# Patient Record
Sex: Female | Born: 1975 | Race: White | Hispanic: No | Marital: Married | Smoking: Former smoker
Health system: Southern US, Community
[De-identification: ages and names within clinical notes are randomized; demographics above are authoritative.]

## PROBLEM LIST (undated history)

## (undated) DIAGNOSIS — I82409 Acute embolism and thrombosis of unspecified deep veins of unspecified lower extremity: Secondary | ICD-10-CM

## (undated) DIAGNOSIS — R1011 Right upper quadrant pain: Secondary | ICD-10-CM

## (undated) DIAGNOSIS — E162 Hypoglycemia, unspecified: Secondary | ICD-10-CM

## (undated) DIAGNOSIS — I493 Ventricular premature depolarization: Secondary | ICD-10-CM

## (undated) DIAGNOSIS — R51 Headache: Secondary | ICD-10-CM

## (undated) DIAGNOSIS — Z9109 Other allergy status, other than to drugs and biological substances: Secondary | ICD-10-CM

## (undated) DIAGNOSIS — H3581 Retinal edema: Secondary | ICD-10-CM

## (undated) DIAGNOSIS — D72829 Elevated white blood cell count, unspecified: Secondary | ICD-10-CM

## (undated) DIAGNOSIS — Z8669 Personal history of other diseases of the nervous system and sense organs: Secondary | ICD-10-CM

## (undated) DIAGNOSIS — R519 Headache, unspecified: Secondary | ICD-10-CM

## (undated) DIAGNOSIS — K900 Celiac disease: Secondary | ICD-10-CM

## (undated) DIAGNOSIS — M545 Low back pain, unspecified: Secondary | ICD-10-CM

## (undated) DIAGNOSIS — R110 Nausea: Secondary | ICD-10-CM

## (undated) DIAGNOSIS — N809 Endometriosis, unspecified: Secondary | ICD-10-CM

## (undated) DIAGNOSIS — F419 Anxiety disorder, unspecified: Secondary | ICD-10-CM

## (undated) DIAGNOSIS — Z8489 Family history of other specified conditions: Secondary | ICD-10-CM

## (undated) DIAGNOSIS — I2699 Other pulmonary embolism without acute cor pulmonale: Secondary | ICD-10-CM

## (undated) DIAGNOSIS — T7840XA Allergy, unspecified, initial encounter: Secondary | ICD-10-CM

## (undated) DIAGNOSIS — J45909 Unspecified asthma, uncomplicated: Secondary | ICD-10-CM

## (undated) DIAGNOSIS — K219 Gastro-esophageal reflux disease without esophagitis: Secondary | ICD-10-CM

## (undated) DIAGNOSIS — R1013 Epigastric pain: Secondary | ICD-10-CM

## (undated) DIAGNOSIS — H9319 Tinnitus, unspecified ear: Secondary | ICD-10-CM

## (undated) HISTORY — DX: Elevated white blood cell count, unspecified: D72.829

## (undated) HISTORY — DX: Celiac disease: K90.0

## (undated) HISTORY — DX: Other allergy status, other than to drugs and biological substances: Z91.09

## (undated) HISTORY — DX: Acute embolism and thrombosis of unspecified deep veins of unspecified lower extremity: I82.409

## (undated) HISTORY — DX: Personal history of other diseases of the nervous system and sense organs: Z86.69

## (undated) HISTORY — DX: Hypoglycemia, unspecified: E16.2

## (undated) HISTORY — DX: Tinnitus, unspecified ear: H93.19

## (undated) HISTORY — DX: Right upper quadrant pain: R10.11

## (undated) HISTORY — DX: Unspecified asthma, uncomplicated: J45.909

## (undated) HISTORY — DX: Retinal edema: H35.81

## (undated) HISTORY — PX: OTHER SURGICAL HISTORY: SHX169

## (undated) HISTORY — DX: Gastro-esophageal reflux disease without esophagitis: K21.9

## (undated) HISTORY — DX: Allergy, unspecified, initial encounter: T78.40XA

## (undated) HISTORY — PX: DILATION AND CURETTAGE OF UTERUS: SHX78

## (undated) HISTORY — PX: ABDOMINAL HYSTERECTOMY: SHX81

## (undated) HISTORY — DX: Nausea: R11.0

## (undated) HISTORY — DX: Low back pain, unspecified: M54.50

## (undated) HISTORY — PX: WISDOM TOOTH EXTRACTION: SHX21

## (undated) HISTORY — DX: Ventricular premature depolarization: I49.3

## (undated) HISTORY — DX: Anxiety disorder, unspecified: F41.9

## (undated) HISTORY — DX: Epigastric pain: R10.13

## (undated) HISTORY — DX: Low back pain: M54.5

## (undated) HISTORY — DX: Endometriosis, unspecified: N80.9

---

## 2013-11-05 ENCOUNTER — Other Ambulatory Visit (HOSPITAL_COMMUNITY)
Admission: RE | Admit: 2013-11-05 | Discharge: 2013-11-05 | Disposition: A | Discharge status: Home / Self Care | Payer: Managed Care, Other (non HMO) | Source: Ambulatory Visit | Attending: Family Medicine | Admitting: Family Medicine

## 2013-11-05 DIAGNOSIS — Z01419 Encounter for gynecological examination (general) (routine) without abnormal findings: Secondary | ICD-10-CM | POA: Insufficient documentation

## 2014-01-18 ENCOUNTER — Ambulatory Visit: Payer: Managed Care, Other (non HMO) | Attending: Obstetrics & Gynecology | Admitting: Physical Therapy

## 2014-01-18 DIAGNOSIS — M2420 Disorder of ligament, unspecified site: Secondary | ICD-10-CM | POA: Insufficient documentation

## 2014-01-18 DIAGNOSIS — N8000 Endometriosis of the uterus, unspecified: Secondary | ICD-10-CM | POA: Insufficient documentation

## 2014-01-18 DIAGNOSIS — M629 Disorder of muscle, unspecified: Secondary | ICD-10-CM | POA: Insufficient documentation

## 2014-01-18 DIAGNOSIS — IMO0001 Reserved for inherently not codable concepts without codable children: Secondary | ICD-10-CM | POA: Insufficient documentation

## 2014-01-29 ENCOUNTER — Ambulatory Visit: Payer: Managed Care, Other (non HMO) | Attending: Obstetrics & Gynecology | Admitting: Physical Therapy

## 2014-01-29 DIAGNOSIS — N8000 Endometriosis of the uterus, unspecified: Secondary | ICD-10-CM | POA: Insufficient documentation

## 2014-01-29 DIAGNOSIS — IMO0001 Reserved for inherently not codable concepts without codable children: Secondary | ICD-10-CM | POA: Insufficient documentation

## 2014-01-29 DIAGNOSIS — M2420 Disorder of ligament, unspecified site: Secondary | ICD-10-CM | POA: Insufficient documentation

## 2014-01-29 DIAGNOSIS — M629 Disorder of muscle, unspecified: Secondary | ICD-10-CM | POA: Insufficient documentation

## 2014-02-05 ENCOUNTER — Ambulatory Visit: Payer: Managed Care, Other (non HMO) | Admitting: Physical Therapy

## 2014-02-12 ENCOUNTER — Ambulatory Visit: Payer: Managed Care, Other (non HMO) | Admitting: Physical Therapy

## 2014-02-19 ENCOUNTER — Ambulatory Visit: Payer: Managed Care, Other (non HMO) | Admitting: Physical Therapy

## 2014-02-23 ENCOUNTER — Encounter: Payer: Self-pay | Admitting: Internal Medicine

## 2014-02-23 ENCOUNTER — Ambulatory Visit (INDEPENDENT_AMBULATORY_CARE_PROVIDER_SITE_OTHER): Payer: Managed Care, Other (non HMO) | Admitting: Internal Medicine

## 2014-02-23 ENCOUNTER — Encounter (INDEPENDENT_AMBULATORY_CARE_PROVIDER_SITE_OTHER): Payer: Self-pay

## 2014-02-23 VITALS — BP 102/64 | HR 61 | Ht 64.0 in | Wt 143.4 lb

## 2014-02-23 DIAGNOSIS — J309 Allergic rhinitis, unspecified: Secondary | ICD-10-CM

## 2014-02-23 DIAGNOSIS — J3089 Other allergic rhinitis: Secondary | ICD-10-CM

## 2014-02-23 DIAGNOSIS — J4520 Mild intermittent asthma, uncomplicated: Secondary | ICD-10-CM

## 2014-02-23 DIAGNOSIS — J302 Other seasonal allergic rhinitis: Secondary | ICD-10-CM

## 2014-02-23 DIAGNOSIS — J45909 Unspecified asthma, uncomplicated: Secondary | ICD-10-CM

## 2014-02-23 MED ORDER — FLUNISOLIDE HFA 80 MCG/ACT IN AERS: INHALATION_SPRAY | RESPIRATORY_TRACT | Status: DC

## 2014-02-23 NOTE — Progress Notes (Signed)
02/23/14- 38 yoF former smoker referred courtesy of [facility] [place], [place]:.Asthma-needs long acting meds  Moved from [place], new to [employer].. Began as exercise associated asthma dx'd around 1993. Was on Advair 100= sufficient, but says current insurance won't cover any "long acting inhalers". Says Dulera made her "anxious". Never tried singulair. Grew up in smoking family. Skin tested positive but never on shots. Had tubes in ears, otherwise no ENT surgery.  Celiac Positive- avoids gluten.Denies GERD, no recent sinusitis.  Never hosp for asthma. Triggers: exercise, cold aitr, virus infections, pollen allergies-cats, horses, dust.   Prior to Admission medications   Medication Sig Start Date End Date Taking? Authorizing Provider  albuterol (PROAIR HFA) 108 (90 BASE) MCG/ACT inhaler Inhale 2 puffs into the lungs every 6 (six) hours as needed for wheezing or shortness of breath.   Yes Historical Provider, MD  levonorgestrel (MIRENA) 20 MCG/24HR IUD 1 each by Intrauterine route once. Placed 11-03-2013   Yes Historical Provider, MD  beclomethasone (QVAR) 40 MCG/ACT inhaler Inhale 2 puffs into the lungs 2 (two) times daily. 03/15/14   Deneise Lever, MD   Past Medical History  Diagnosis Date  . Endometriosis   . Asthma   . Environmental allergies   . Celiac disease   . Hypoglycemia   . Tinnitus   . Retinal edema   . Hx of migraine headaches   . Low back pain    Past Surgical History  Procedure Laterality Date  . Tubes in ears      age 38  . Wisdom tooth extraction    . Dilation and curettage of uterus     Family History  Problem Relation Age of Onset  . Emphysema Maternal Grandmother     smoker  . Emphysema Maternal Grandfather   . Allergies      both sides of family  . Asthma Mother   . Asthma Brother   . Breast cancer      father side  . Colon cancer Paternal Grandmother   . Heart disease Paternal Grandfather   . Heart attack Paternal Grandfather   . Migraines Sister   .  Endometriosis Sister   . Allergies Sister   . Allergies Brother    History   Social History  . Marital Status: Married    Spouse Name: N/A    Number of Children: 1  . Years of Education: N/A   Occupational History  . clergy    Social History Main Topics  . Smoking status: Former Smoker -- 1.00 packs/day for 5 years    Types: Cigarettes    Quit date: 11/26/2001  . Smokeless tobacco: Not on file  . Alcohol Use: Yes     Comment: 1-3 weekly  . Drug Use: No  . Sexual Activity: Not on file   Other Topics Concern  . Not on file   Social History Narrative  . No narrative on file   ROS-see HPI Constitutional:   No-   weight loss, night sweats, fevers, chills, fatigue, lassitude. HEENT:   No-  headaches, difficulty swallowing, tooth/dental problems, sore throat,       No-  sneezing, itching, ear ache, +nasal congestion, post nasal drip,  CV:  No-   chest pain, orthopnea, PND, swelling in lower extremities, anasarca,  dizziness, +palpitations Resp: + shortness of breath with exertion or at rest.              + productive cough,  + non-productive cough,  No- coughing up of blood.              No-   change in color of mucus.  No- wheezing.   Skin: No-   rash or lesions. GI:  No-   heartburn, indigestion, abdominal pain, nausea, vomiting, diarrhea,                 change in bowel habits, loss of appetite GU: No-   dysuria, change in color of urine, no urgency or frequency.  No- flank pain. MS:  No-   joint pain or swelling.  No- decreased range of motion.  No- back pain. Neuro-     nothing unusual Psych:  No- change in mood or affect. No depression or anxiety.  No memory loss.  OBJ- Physical Exam General- Alert, Oriented, Affect-appropriate, Distress- none acute, well appearing Skin- rash-none, lesions- none, excoriation- none Lymphadenopathy- none Head- atraumatic            Eyes- Gross vision intact, PERRLA, conjunctivae and  secretions clear            Ears- Hearing, canals-normal            Nose-  +watery, no-Septal dev, mucus, polyps, erosion, perforation             Throat- Mallampati II , mucosa clear , drainage- none, tonsils- atrophic Neck- flexible , trachea midline, no stridor , thyroid nl, carotid no bruit Chest - symmetrical excursion , unlabored           Heart/CV- RRR , no murmur , no gallop  , no rub, nl s1 s2                           - JVD- none , edema- none, stasis changes- none, varices- none           Lung- clear to P&A, wheeze- none, cough- none , dullness-none, rub- none           Chest wall-  Abd- tender-no, distended-no, bowel sounds-present, HSM- no Br/ Gen/ Rectal- Not done, not indicated Extrem- cyanosis- none, clubbing, none, atrophy- none, strength- nl Neuro- grossly intact to observation

## 2014-02-23 NOTE — Patient Instructions (Addendum)
Ok to use the [employer] albuterol HFA rescue inhaler 2 puffs, up to 4 times daily , if needed  Try sample/ script Aerospan steroid inhaler for maintenance    2 puffs then rinse mouth, twice daily  Ok to also take an otc antihistamine like Zyrtec/ cetirizine if needed

## 2014-03-05 ENCOUNTER — Ambulatory Visit: Payer: Managed Care, Other (non HMO) | Attending: Obstetrics & Gynecology | Admitting: Physical Therapy

## 2014-03-05 DIAGNOSIS — IMO0001 Reserved for inherently not codable concepts without codable children: Secondary | ICD-10-CM | POA: Insufficient documentation

## 2014-03-05 DIAGNOSIS — M2420 Disorder of ligament, unspecified site: Secondary | ICD-10-CM | POA: Insufficient documentation

## 2014-03-05 DIAGNOSIS — M629 Disorder of muscle, unspecified: Secondary | ICD-10-CM | POA: Insufficient documentation

## 2014-03-05 DIAGNOSIS — N8000 Endometriosis of the uterus, unspecified: Secondary | ICD-10-CM | POA: Insufficient documentation

## 2014-03-15 ENCOUNTER — Telehealth: Payer: Self-pay | Admitting: Internal Medicine

## 2014-03-15 MED ORDER — BECLOMETHASONE DIPROPIONATE 40 MCG/ACT IN AERS: 2.0000 | INHALATION_SPRAY | Freq: Two times a day (BID) | RESPIRATORY_TRACT | Status: DC

## 2014-03-15 NOTE — Telephone Encounter (Signed)
Spoke w/ pt. She reports Jeralyn Ruths is not covered.  I called spoke w/ [facility]. Was advised the covered alternatives they were provided with includes ASMANEX, FLOVENT, PULMICORT, QVAR Please advise Dr. Annamaria Boots thanks  No Known Allergies

## 2014-03-15 NOTE — Telephone Encounter (Signed)
Ok to d/c [employer] and Rx Qvar 40, 2 puffs then rinse mouth, twice daily  Refill prn

## 2014-03-15 NOTE — Telephone Encounter (Signed)
I called and made pt aware changing aerospan to QVAR. She voiced her understanding and RX called in. Nothing further needed

## 2014-03-24 DIAGNOSIS — J3089 Other allergic rhinitis: Secondary | ICD-10-CM

## 2014-03-24 DIAGNOSIS — J4520 Mild intermittent asthma, uncomplicated: Secondary | ICD-10-CM | POA: Insufficient documentation

## 2014-03-24 DIAGNOSIS — J302 Other seasonal allergic rhinitis: Secondary | ICD-10-CM | POA: Insufficient documentation

## 2014-03-24 NOTE — Assessment & Plan Note (Signed)
Much need for anything more than occasional antihistamine. Discussed environmental precautions.

## 2014-03-24 NOTE — Assessment & Plan Note (Signed)
Plan-try steroid inhaler Aerospan plus Singulair, with instruction

## 2014-03-26 ENCOUNTER — Ambulatory Visit: Payer: Managed Care, Other (non HMO) | Attending: Obstetrics & Gynecology | Admitting: Physical Therapy

## 2014-03-26 DIAGNOSIS — IMO0001 Reserved for inherently not codable concepts without codable children: Secondary | ICD-10-CM | POA: Insufficient documentation

## 2014-03-26 DIAGNOSIS — N8000 Endometriosis of the uterus, unspecified: Secondary | ICD-10-CM | POA: Insufficient documentation

## 2014-03-26 DIAGNOSIS — M2420 Disorder of ligament, unspecified site: Secondary | ICD-10-CM | POA: Insufficient documentation

## 2014-03-26 DIAGNOSIS — M629 Disorder of muscle, unspecified: Secondary | ICD-10-CM | POA: Insufficient documentation

## 2014-05-10 ENCOUNTER — Ambulatory Visit (INDEPENDENT_AMBULATORY_CARE_PROVIDER_SITE_OTHER): Payer: Managed Care, Other (non HMO) | Admitting: Internal Medicine

## 2014-05-10 ENCOUNTER — Encounter (INDEPENDENT_AMBULATORY_CARE_PROVIDER_SITE_OTHER): Payer: Self-pay

## 2014-05-10 ENCOUNTER — Encounter: Payer: Self-pay | Admitting: Internal Medicine

## 2014-05-10 VITALS — BP 108/58 | HR 56 | Ht 64.0 in | Wt 142.0 lb

## 2014-05-10 DIAGNOSIS — J302 Other seasonal allergic rhinitis: Secondary | ICD-10-CM

## 2014-05-10 DIAGNOSIS — J45909 Unspecified asthma, uncomplicated: Secondary | ICD-10-CM

## 2014-05-10 DIAGNOSIS — J309 Allergic rhinitis, unspecified: Secondary | ICD-10-CM

## 2014-05-10 DIAGNOSIS — J3089 Other allergic rhinitis: Secondary | ICD-10-CM

## 2014-05-10 DIAGNOSIS — J4520 Mild intermittent asthma, uncomplicated: Secondary | ICD-10-CM

## 2014-05-10 MED ORDER — BECLOMETHASONE DIPROPIONATE 40 MCG/ACT IN AERS: INHALATION_SPRAY | RESPIRATORY_TRACT | Status: DC

## 2014-05-10 MED ORDER — ALBUTEROL SULFATE HFA 108 (90 BASE) MCG/ACT IN AERS: INHALATION_SPRAY | RESPIRATORY_TRACT | Status: DC

## 2014-05-10 MED ORDER — AEROCHAMBER MV MISC: Status: AC

## 2014-05-10 NOTE — Patient Instructions (Addendum)
Our nurse will call your drug store to have them put on hold the refill scripts for Qvar 40 and Proair    [facility] made for aerochamber spacer tube. Try using this with your Qvar steroid inhaler to reduce hoarseness.

## 2014-05-10 NOTE — Progress Notes (Signed)
02/23/14- 38 yoF former smoker referred courtesy of [facility] [place], [place]:.Asthma-needs long acting meds  Moved from [place], new to [employer].. Began as exercise associated asthma dx'd around 1993. Was on Advair 100= sufficient, but says current insurance won't cover any "long acting inhalers". Says Dulera made her "anxious". Never tried singulair. Grew up in smoking family. Skin tested positive but never on shots. Had tubes in ears, otherwise no ENT surgery.  Celiac Positive- avoids gluten.Denies GERD, no recent sinusitis.  Never hosp for asthma. Triggers: exercise, cold aitr, virus infections, pollen allergies-cats, horses, dust.   05/10/14- 74 yoF former smoker referred courtesy of [facility] [place], [place]:.Asthma-needs long acting meds  FOLLOWS FOR: needs proair refill.  No complaints with meds/symptoms.  Qvar seems to help.  Doing well without chest tightness or significant cough. Continues Qvar 40. Uses rescue inhaler about once a day before exercise. Denies nasal congestion.  ROS-see HPI Constitutional:   No-   weight loss, night sweats, fevers, chills, fatigue, lassitude. HEENT:   No-  headaches, difficulty swallowing, tooth/dental problems, sore throat,       No-  sneezing, itching, ear ache, nasal congestion, post nasal drip,  CV:  No-   chest pain, orthopnea, PND, swelling in lower extremities, anasarca,                                                       dizziness, +palpitations Resp: + shortness of breath with exertion or at rest.              + productive cough,  + non-productive cough,  No- coughing up of blood.              No-   change in color of mucus.  No- wheezing.   Skin: No-   rash or lesions. GI:  No-   heartburn, indigestion, abdominal pain, nausea, vomiting,  GU:  MS:  No-   joint pain or swelling.   Neuro-     nothing unusual Psych:  No- change in mood or affect. No depression or anxiety.  No memory loss.  OBJ- Physical Exam General- Alert, Oriented,  Affect-appropriate, Distress- none acute, well appearing Skin- rash-none, lesions- none, excoriation- none Lymphadenopathy- none Head- atraumatic            Eyes- Gross vision intact, PERRLA, conjunctivae and secretions clear            Ears- Hearing, canals-normal            Nose-  clear, no-Septal dev, mucus, polyps, erosion, perforation             Throat- Mallampati II , mucosa clear , drainage- none, tonsils- atrophic Neck- flexible , trachea midline, no stridor , thyroid nl, carotid no bruit Chest - symmetrical excursion , unlabored           Heart/CV- RRR , no murmur , no gallop  , no rub, nl s1 s2                           - JVD- none , edema- none, stasis changes- none, varices- none           Lung- clear to P&A, wheeze- none, cough- none , dullness-none, rub- none  Chest wall-  Abd-  Br/ Gen/ Rectal- Not done, not indicated Extrem- cyanosis- none, clubbing, none, atrophy- none, strength- nl Neuro- grossly intact to observation

## 2014-05-20 ENCOUNTER — Other Ambulatory Visit: Payer: Self-pay | Admitting: *Deleted

## 2014-05-20 MED ORDER — BECLOMETHASONE DIPROPIONATE 40 MCG/ACT IN AERS: INHALATION_SPRAY | RESPIRATORY_TRACT | Status: DC

## 2014-07-11 NOTE — Assessment & Plan Note (Addendum)
Control maintained with Qvar 40 and appropriate use of rescue inhaler before exercise Plan-refill inhalers, add spacer tube

## 2014-07-11 NOTE — Assessment & Plan Note (Signed)
Just using saline nasal spray now

## 2014-11-09 ENCOUNTER — Encounter: Payer: Self-pay | Admitting: Internal Medicine

## 2014-11-09 ENCOUNTER — Ambulatory Visit (INDEPENDENT_AMBULATORY_CARE_PROVIDER_SITE_OTHER): Payer: Managed Care, Other (non HMO) | Admitting: Internal Medicine

## 2014-11-09 VITALS — BP 98/62 | HR 56 | Ht 64.0 in | Wt 142.2 lb

## 2014-11-09 DIAGNOSIS — J3089 Other allergic rhinitis: Secondary | ICD-10-CM

## 2014-11-09 DIAGNOSIS — J4520 Mild intermittent asthma, uncomplicated: Secondary | ICD-10-CM

## 2014-11-09 DIAGNOSIS — J302 Other seasonal allergic rhinitis: Secondary | ICD-10-CM

## 2014-11-09 DIAGNOSIS — J309 Allergic rhinitis, unspecified: Secondary | ICD-10-CM

## 2014-11-09 DIAGNOSIS — Z23 Encounter for immunization: Secondary | ICD-10-CM

## 2014-11-09 MED ORDER — FLUTICASONE FUROATE 100 MCG/ACT IN AEPB: 1.0000 | INHALATION_SPRAY | RESPIRATORY_TRACT | Status: DC

## 2014-11-09 NOTE — Assessment & Plan Note (Signed)
Better control now outside of peak pollen season. We discussed non-pollen triggers recognizing importance of current unusually warm weather.

## 2014-11-09 NOTE — Patient Instructions (Signed)
Instead of Qvar, Try Arnuity (card and script given) 1 puff then rinse mouth well, one time daily  Ok to use rescue albuterol inhaler if needed  Flu vax

## 2014-11-09 NOTE — Assessment & Plan Note (Signed)
Mild symptoms are more persistent recently, possibly because of weather. Qvar might work better if she could remember to use it twice daily. We agreed to try a once a day equivalent. Plan- Try Arnuity fluticasone inhaler with instruction, instead of Qvar. Available coupon for free trial.  Flu vax

## 2014-11-09 NOTE — Progress Notes (Signed)
02/23/14- 38 yoF former smoker referred courtesy of [facility], [place]:.Asthma-needs long acting meds  Moved from [place], new to [employer].. Began as exercise associated asthma dx'd around 1993. Was on Advair 100= sufficient, but says current insurance won't cover any "long acting inhalers". Says Dulera made her "anxious". Never tried singulair. Grew up in smoking family. Skin tested positive but never on shots. Had tubes in ears, otherwise no ENT surgery.  Celiac Positive- avoids gluten.Denies GERD, no recent sinusitis.  Never hosp for asthma. Triggers: exercise, cold aitr, virus infections, pollen allergies-cats, horses, dust.   05/10/14- 70 yoF former smoker referred courtesy of [facility], [place]:.Asthma-needs long acting meds  FOLLOWS FOR: needs proair refill.  No complaints with meds/symptoms.  Qvar seems to help.  Doing well without chest tightness or significant cough. Continues Qvar 40. Uses rescue inhaler about once a day before exercise. Denies nasal congestion.  11/09/14-38 yoF former smoker followed for asthma, allergic rhinitis FOLLOWS FOR: can tell Qvar does not last as long as Advair did; noticed a couple of days ago she was wheezy. Likes cooler weather. Saw cardiology for palpitations. Admits she tends Qvar only once daily. Tries to place it very won't be reached by her 61-year-old child. We talked about available 24-hour single-dose options. Requests flu shot. Occasional tightness/anterior chest pressure she associates with her breathing, without wheeze.  ROS-see HPI Constitutional:   No-   weight loss, night sweats, fevers, chills, fatigue, lassitude. HEENT:   No-  headaches, difficulty swallowing, tooth/dental problems, sore throat,       No-  sneezing, itching, ear ache, nasal congestion, post nasal drip,  CV:  No-   chest pain, orthopnea, PND, swelling in lower extremities, anasarca,                                                       dizziness,  +palpitations Resp: + shortness of breath with exertion or at rest.              + productive cough,  + non-productive cough,  No- coughing up of blood.              No-   change in color of mucus.  + wheezing.   Skin: No-   rash or lesions. GI:  No-   heartburn, indigestion, abdominal pain, nausea, vomiting,  GU:  MS:  No-   joint pain or swelling.   Neuro-     nothing unusual Psych:  No- change in mood or affect. No depression or anxiety.  No memory loss.  OBJ- Physical Exam General- Alert, Oriented, Affect-appropriate, Distress- none acute, well appearing Skin- rash-none, lesions- none, excoriation- none Lymphadenopathy- none Head- atraumatic            Eyes- Gross vision intact, PERRLA, conjunctivae and secretions clear            Ears- Hearing, canals-normal            Nose-  clear, no-Septal dev, mucus, polyps, erosion, perforation             Throat- Mallampati II , mucosa clear , drainage- none, tonsils- atrophic Neck- flexible , trachea midline, no stridor , thyroid nl, carotid no bruit Chest - symmetrical excursion , unlabored           Heart/CV- RRR , no murmur ,  no gallop  , no rub, nl s1 s2                           - JVD- none , edema- none, stasis changes- none, varices- none           Lung- clear to P&A, wheeze- none, cough- none , dullness-none, rub- none           Chest wall-  Abd-  Br/ Gen/ Rectal- Not done, not indicated Extrem- cyanosis- none, clubbing, none, atrophy- none, strength- nl Neuro- grossly intact to observation

## 2015-01-11 ENCOUNTER — Ambulatory Visit
Admission: RE | Admit: 2015-01-11 | Discharge: 2015-01-11 | Disposition: A | Discharge status: Home / Self Care | Payer: Managed Care, Other (non HMO) | Source: Ambulatory Visit | Attending: Family Medicine | Admitting: Family Medicine

## 2015-01-11 ENCOUNTER — Other Ambulatory Visit: Payer: Self-pay | Admitting: Family Medicine

## 2015-01-11 DIAGNOSIS — R0789 Other chest pain: Secondary | ICD-10-CM

## 2015-02-07 ENCOUNTER — Encounter: Payer: Self-pay | Admitting: Internal Medicine

## 2015-02-07 ENCOUNTER — Ambulatory Visit: Payer: Managed Care, Other (non HMO) | Admitting: Internal Medicine

## 2015-02-07 VITALS — BP 100/64 | HR 64 | Temp 97.7°F | Ht 64.0 in | Wt 142.8 lb

## 2015-02-07 DIAGNOSIS — J4520 Mild intermittent asthma, uncomplicated: Secondary | ICD-10-CM

## 2015-02-07 MED ORDER — BECLOMETHASONE DIPROPIONATE 40 MCG/ACT IN AERS: INHALATION_SPRAY | RESPIRATORY_TRACT | Status: DC

## 2015-02-07 MED ORDER — ALBUTEROL SULFATE HFA 108 (90 BASE) MCG/ACT IN AERS: INHALATION_SPRAY | RESPIRATORY_TRACT | Status: DC

## 2015-02-07 NOTE — Patient Instructions (Signed)
Refill scripts sent for Proair and Qvar   Ok to continue as you are doing  You can use an otc antihistamine like claritin, zyrtec or allegra, and a nasal steroid spray like flonase if needed for allergic nose.

## 2015-02-07 NOTE — Progress Notes (Signed)
02/23/14- 38 yoF former smoker referred courtesy of [facility] [place], [place]:.Asthma-needs long acting meds  Moved from [place], new to [employer].. Began as exercise associated asthma dx'd around 1993. Was on Advair 100= sufficient, but says current insurance won't cover any "long acting inhalers". Says Dulera made her "anxious". Never tried singulair. Grew up in smoking family. Skin tested positive but never on shots. Had tubes in ears, otherwise no ENT surgery.  Celiac Positive- avoids gluten.Denies GERD, no recent sinusitis.  Never hosp for asthma. Triggers: exercise, cold aitr, virus infections, pollen allergies-cats, horses, dust.   05/10/14- 8 yoF former smoker referred courtesy of [facility], [place]:.Asthma-needs long acting meds  FOLLOWS FOR: needs proair refill.  No complaints with meds/symptoms.  Qvar seems to help.  Doing well without chest tightness or significant cough. Continues Qvar 40. Uses rescue inhaler about once a day before exercise. Denies nasal congestion.  11/09/14-38 yoF former smoker followed for asthma, allergic rhinitis FOLLOWS FOR: can tell Qvar does not last as long as Advair did; noticed a couple of days ago she was wheezy. Likes cooler weather. Saw cardiology for palpitations. Admits she tends Qvar only once daily. Tries to place it where won't be reached by her 24-year-old child. We talked about available 24-hour single-dose options. Requests flu shot. Occasional tightness/anterior chest pressure she associates with her breathing, without wheeze.  02/07/15- 89 yoF former smoker followed for asthma, allergic rhinitis FOLLOWS FOR States not taking Arnuity due to cost. States that QVAR has been working well for her. States feeling well. Denies f/c/s, n/c, hemoptysis, or edema     ROS-see HPI Constitutional:   No-   weight loss, night sweats, fevers, chills, fatigue, lassitude. HEENT:   No-  headaches, difficulty swallowing, tooth/dental problems, sore throat,        No-  sneezing, itching, ear ache, nasal congestion, post nasal drip,  CV:  No-   chest pain, orthopnea, PND, swelling in lower extremities, anasarca,                                                       dizziness, +palpitations Resp: + shortness of breath with exertion or at rest.              + productive cough,  + non-productive cough,  No- coughing up of blood.              No-   change in color of mucus.  + wheezing.   Skin: No-   rash or lesions. GI:  No-   heartburn, indigestion, abdominal pain, nausea, vomiting,  GU:  MS:  No-   joint pain or swelling.   Neuro-     nothing unusual Psych:  No- change in mood or affect. No depression or anxiety.  No memory loss.  OBJ- Physical Exam General- Alert, Oriented, Affect-appropriate, Distress- none acute, well appearing Skin- rash-none, lesions- none, excoriation- none Lymphadenopathy- none Head- atraumatic            Eyes- Gross vision intact, PERRLA, conjunctivae and secretions clear            Ears- Hearing, canals-normal            Nose-  clear, no-Septal dev, mucus, polyps, erosion, perforation             Throat- Mallampati II ,  mucosa clear , drainage- none, tonsils- atrophic Neck- flexible , trachea midline, no stridor , thyroid nl, carotid no bruit Chest - symmetrical excursion , unlabored           Heart/CV- RRR , no murmur , no gallop  , no rub, nl s1 s2                           - JVD- none , edema- none, stasis changes- none, varices- none           Lung- clear to P&A, wheeze- none, cough- none , dullness-none, rub- none           Chest wall-  Abd-  Br/ Gen/ Rectal- Not done, not indicated Extrem- cyanosis- none, clubbing, none, atrophy- none, strength- nl Neuro- grossly intact to observation

## 2015-08-10 ENCOUNTER — Ambulatory Visit: Payer: Managed Care, Other (non HMO) | Admitting: Internal Medicine

## 2016-02-10 ENCOUNTER — Other Ambulatory Visit: Payer: Self-pay | Admitting: Physician Assistant

## 2016-02-10 DIAGNOSIS — R1013 Epigastric pain: Secondary | ICD-10-CM

## 2016-02-10 DIAGNOSIS — R110 Nausea: Secondary | ICD-10-CM

## 2016-02-10 DIAGNOSIS — R1011 Right upper quadrant pain: Secondary | ICD-10-CM

## 2016-02-17 ENCOUNTER — Ambulatory Visit
Admission: RE | Admit: 2016-02-17 | Discharge: 2016-02-17 | Disposition: A | Discharge status: Home / Self Care | Payer: Managed Care, Other (non HMO) | Source: Ambulatory Visit | Attending: Physician Assistant | Admitting: Physician Assistant

## 2016-02-17 DIAGNOSIS — R1011 Right upper quadrant pain: Secondary | ICD-10-CM

## 2016-02-17 DIAGNOSIS — R1013 Epigastric pain: Secondary | ICD-10-CM

## 2016-02-17 DIAGNOSIS — R110 Nausea: Secondary | ICD-10-CM

## 2017-04-05 ENCOUNTER — Ambulatory Visit: Payer: Managed Care, Other (non HMO) | Admitting: Internal Medicine

## 2017-04-09 ENCOUNTER — Ambulatory Visit (INDEPENDENT_AMBULATORY_CARE_PROVIDER_SITE_OTHER)
Admission: RE | Admit: 2017-04-09 | Discharge: 2017-04-09 | Disposition: A | Discharge status: Home / Self Care | Payer: Managed Care, Other (non HMO) | Source: Ambulatory Visit | Attending: Internal Medicine | Admitting: Internal Medicine

## 2017-04-09 ENCOUNTER — Ambulatory Visit (INDEPENDENT_AMBULATORY_CARE_PROVIDER_SITE_OTHER): Payer: Managed Care, Other (non HMO) | Admitting: Internal Medicine

## 2017-04-09 ENCOUNTER — Telehealth: Payer: Self-pay | Admitting: Internal Medicine

## 2017-04-09 ENCOUNTER — Other Ambulatory Visit: Payer: Self-pay | Admitting: Internal Medicine

## 2017-04-09 ENCOUNTER — Encounter: Payer: Self-pay | Admitting: Internal Medicine

## 2017-04-09 VITALS — BP 112/68 | HR 56 | Ht 64.0 in | Wt 140.2 lb

## 2017-04-09 DIAGNOSIS — J4520 Mild intermittent asthma, uncomplicated: Secondary | ICD-10-CM | POA: Diagnosis not present

## 2017-04-09 DIAGNOSIS — J302 Other seasonal allergic rhinitis: Secondary | ICD-10-CM

## 2017-04-09 DIAGNOSIS — N809 Endometriosis, unspecified: Secondary | ICD-10-CM | POA: Diagnosis not present

## 2017-04-09 DIAGNOSIS — R079 Chest pain, unspecified: Secondary | ICD-10-CM | POA: Insufficient documentation

## 2017-04-09 DIAGNOSIS — R0789 Other chest pain: Secondary | ICD-10-CM

## 2017-04-09 DIAGNOSIS — J3089 Other allergic rhinitis: Secondary | ICD-10-CM

## 2017-04-09 MED ORDER — ALBUTEROL SULFATE HFA 108 (90 BASE) MCG/ACT IN AERS: INHALATION_SPRAY | RESPIRATORY_TRACT | 3 refills | Status: DC

## 2017-04-09 MED ORDER — BECLOMETHASONE DIPROP HFA 40 MCG/ACT IN AERB: 2.0000 | INHALATION_SPRAY | Freq: Two times a day (BID) | RESPIRATORY_TRACT | 0 refills | Status: DC

## 2017-04-09 MED ORDER — BUDESONIDE-FORMOTEROL FUMARATE 160-4.5 MCG/ACT IN AERO: INHALATION_SPRAY | RESPIRATORY_TRACT | 12 refills | Status: DC

## 2017-04-09 MED ORDER — BUDESONIDE-FORMOTEROL FUMARATE 160-4.5 MCG/ACT IN AERO: 2.0000 | INHALATION_SPRAY | Freq: Two times a day (BID) | RESPIRATORY_TRACT | 0 refills | Status: DC

## 2017-04-09 NOTE — Progress Notes (Signed)
Patient seen in the office today and instructed on use of Symbicort 160.  Patient expressed understanding and demonstrated technique. Sierra Olsen [facility] 04/09/17

## 2017-04-09 NOTE — Progress Notes (Signed)
HPI F former smoker followed for Asthma, allergic rhinitis, complicated by celiac disease, endometriosis Skin tested positive but never on shots. Had tubes in ears, otherwise no ENT surgery.  Celiac Positive- avoids gluten.Denies GERD -----------------------------------------------------------------------------------  11/09/14-41 yoF former smoker followed for asthma, allergic rhinitis FOLLOWS FOR: can tell Qvar does not last as long as Advair did; noticed a couple of days ago she was wheezy. Likes cooler weather. Saw cardiology for palpitations. Admits she tends Qvar only once daily. Tries to place it where won't be reached by her 60-year-old child. We talked about available 24-hour single-dose options. Requests flu shot. Occasional tightness/anterior chest pressure she associates with her breathing, without wheeze.  02/07/15- 41 yoF former smoker followed for asthma, allergic rhinitis FOLLOWS FOR States not taking Arnuity due to cost. States that QVAR has been working well for her. States feeling well. Denies f/c/s, n/c, hemoptysis, or edema   04/09/17-41 year old former smoker followed for Asthma, allergic rhinitis, complicated by celiac disease, endometriosis follow up for asthma. Needs refills on medications.  Took Z-Pak twice during the winter and then had a sinusitis which has resolved. Persistent dry cough. Not much actual wheezing. Uses pro air once on most days. Still feels Qvar helps some. She did better with Advair but insurance didn't cover. New concern of a persistent pain behind right breast worse with deep breath. She questions relation to her endometriosis but does not notice a cycling pattern and is not coughing blood. No chest trauma  ROS-see HPI Constitutional:   No-   weight loss, night sweats, fevers, chills, fatigue, lassitude. HEENT:   No-  headaches, difficulty swallowing, tooth/dental problems, sore throat,       No-  sneezing, itching, ear ache, nasal congestion, post  nasal drip,  CV:  + chest pain, orthopnea, PND, swelling in lower extremities, anasarca,                                                                         dizziness, +palpitations Resp: + shortness of breath with exertion or at rest.               productive cough,  + non-productive cough,  No- coughing up of blood.              No-   change in color of mucus.  + wheezing.   Skin: No-   rash or lesions. GI:  No-   heartburn, indigestion, abdominal pain, nausea, vomiting,  GU:  MS:  No-   joint pain or swelling.   Neuro-     nothing unusual Psych:  No- change in mood or affect. No depression or anxiety.  No memory loss.  OBJ- Physical Exam General- Alert, Oriented, Affect-appropriate, Distress- none acute, well appearing Skin- rash-none, lesions- none, excoriation- none Lymphadenopathy- none Head- atraumatic            Eyes- Gross vision intact, PERRLA, conjunctivae and secretions clear            Ears- Hearing, canals-normal            Nose-  clear, no-Septal dev, mucus, polyps, erosion, perforation             Throat- Mallampati II , mucosa clear ,  drainage- none, tonsils- atrophic Neck- flexible , trachea midline, no stridor , thyroid nl, carotid no bruit Chest - symmetrical excursion , unlabored           Heart/CV- RRR , no murmur , no gallop  , no rub, nl s1 s2                           - JVD- none , edema- none, stasis changes- none, varices- none           Lung- clear to P&A, wheeze- none, cough- none , dullness-none, rub- none           Chest wall-  Abd-  Br/ Gen/ Rectal- Not done, not indicated Extrem- cyanosis- none, clubbing, none, atrophy- none, strength- nl Neuro- grossly intact to observation

## 2017-04-09 NOTE — Assessment & Plan Note (Signed)
Describes persistent focal right chest pain deep to chest wall behind right breast which seems worse with deep breath. I don't hear a rub Plan-CXR

## 2017-04-09 NOTE — Telephone Encounter (Signed)
Patient returned phone call, patient contact # [phone].[facility]

## 2017-04-09 NOTE — Telephone Encounter (Signed)
Ok as she requests, to extend Qvar.

## 2017-04-09 NOTE — Telephone Encounter (Signed)
lmtcb x1 for pt. 

## 2017-04-09 NOTE — Assessment & Plan Note (Signed)
She needs more of a maintenance controller than just an inhaled steroid. We think the problem with Advair was insurance coverage. Plan-try Symbicort with discussion She will need at least an office spirometry next visit   Patient seen in the office today and instructed on use of Symbicort 160.  Patient expressed understanding and demonstrated technique. Benetta Spar [facility] 04/09/17

## 2017-04-09 NOTE — Assessment & Plan Note (Signed)
We discussed antihistamines. She has not had a major exacerbation of rhinitis or eye symptoms this spring.

## 2017-04-09 NOTE — Patient Instructions (Signed)
Order- CXR  Dx  Asthma , mild uncomplicated, endometriosis  Sample and print script to tor Singulair 160     Inhale 2 puffs then rinse mouth, twice daily      Try this instead of Qvar. Ok still to use your rescue inhaler as needed.  Please call if we can help

## 2017-04-09 NOTE — Telephone Encounter (Signed)
Pt states [place] is going out to the country in two days. Pt would like to continue qvar 40 while out of country and start symbicort when she returns home. Pt is requesting a 1 mo supply of [facility] 40 [facility] [address]  CY please advise on Qvar Rx. Thanks.

## 2017-04-09 NOTE — Telephone Encounter (Signed)
Rx has been sent to preferred pharmacy.  Pt is aware and voiced her understanding. Nothing further needed.  

## 2017-04-12 ENCOUNTER — Telehealth: Payer: Self-pay | Admitting: Internal Medicine

## 2017-04-12 NOTE — Telephone Encounter (Signed)
Pt aware of results and voiced her understanding. Nothing further needed.   Notes recorded by Deneise Lever, MD on 04/09/2017 at 2:45 PM EDT CXR- lungs are a little over-inflated, which goes along with asthma history. Otherwise everything looks normal. I don't see any thing to explain the chest pain we discussed. If that discomfort persists, please let me know.

## 2017-06-08 ENCOUNTER — Other Ambulatory Visit: Payer: Self-pay | Admitting: Internal Medicine

## 2017-06-24 ENCOUNTER — Telehealth: Payer: Self-pay | Admitting: Internal Medicine

## 2017-06-24 NOTE — Telephone Encounter (Signed)
Pt feels like she is having a slight reaction to the inhaler. Pt states that when she takes that Symbicort 126mg she feels really jittery - 2 puff. Elevated heart rate.  Pt states that she has not done the full dose of 2 puff BID because of the way the first two puffs made her feel.  Pt states that she took ONE puff and felt fine -- wanting to know if she can decrease dose down to 1 puff BID?  Pt was changed from QVAR to Symbicort.   Please advise Dr YAnnamaria Boots Thanks.    Allergies as of 06/24/2017   No Known Allergies     Medication List       Accurate as of 06/24/17  3:09 PM. Always use your most recent med list.          AEROCHAMBER MV inhaler Use as instructed   albuterol 108 (90 Base) MCG/ACT inhaler Commonly known as:  PROAIR HFA 2 puffs every 6 hours as needed   budesonide-formoterol 160-4.5 MCG/ACT inhaler Commonly known as:  SYMBICORT Inhale 2 puffs then rinse mouth, twice daily   budesonide-formoterol 160-4.5 MCG/ACT inhaler Commonly known as:  SYMBICORT Inhale 2 puffs into the lungs 2 (two) times daily.   mometasone 50 MCG/ACT nasal spray Commonly known as:  NASONEX Place 2 sprays into the nose daily.   QVAR REDIHALER 40 MCG/ACT inhaler Generic drug:  beclomethasone TAKE 2 PUFFS BY MOUTH TWICE A DAY

## 2017-06-24 NOTE — Telephone Encounter (Signed)
Fine to stay at 1 puff then rise mouth, twice daily

## 2017-06-24 NOTE — Telephone Encounter (Signed)
Spoke with patient, aware of rec's per Dr Annamaria Boots. Nothing further needed.

## 2017-08-12 ENCOUNTER — Ambulatory Visit (INDEPENDENT_AMBULATORY_CARE_PROVIDER_SITE_OTHER): Payer: Managed Care, Other (non HMO) | Admitting: Internal Medicine

## 2017-08-12 ENCOUNTER — Encounter: Payer: Self-pay | Admitting: Internal Medicine

## 2017-08-12 DIAGNOSIS — J4520 Mild intermittent asthma, uncomplicated: Secondary | ICD-10-CM

## 2017-08-12 DIAGNOSIS — J3089 Other allergic rhinitis: Secondary | ICD-10-CM | POA: Diagnosis not present

## 2017-08-12 DIAGNOSIS — J302 Other seasonal allergic rhinitis: Secondary | ICD-10-CM

## 2017-08-12 NOTE — Progress Notes (Signed)
HPI F former smoker followed for Asthma, allergic rhinitis, complicated by celiac disease, endometriosis Skin tested positive but never on shots. Had tubes in ears, otherwise no ENT surgery.  Celiac Positive- avoids gluten.Denies GERD -----------------------------------------------------------------------------------   04/09/17-41 year old former smoker followed for Asthma, allergic rhinitis, complicated by celiac disease, endometriosis follow up for asthma. Needs refills on medications.  Took Z-Pak twice during the winter and then had a sinusitis which has resolved. Persistent dry cough. Not much actual wheezing. Uses pro air once on most days. Still feels Qvar helps some. She did better with Advair but insurance didn't cover. New concern of a persistent pain behind right breast worse with deep breath. She questions relation to her endometriosis but does not notice a cycling pattern and is not coughing blood. No chest trauma  08/12/17- 56 year old former smoker followed for Asthma, allergic rhinitis, complicated by celiac disease, endometriosis  FOLLOWS FOR: Pt states she is doing well being on Symbicort inhaler. Denies any wheezing, cough, congestion, or allergy related issues/concerns. She thinks one puff of Symbicort twice daily works great, better than Qvar. 2 puffs associated with palpitations. No sleep disturbance and little need for rescue inhaler recently. CXR 04/09/17 IMPRESSION: Mild hyperinflation consistent with known reactive airway disease. No pneumonia, CHF, nor other acute cardiopulmonary abnormality.  ROS-see HPI  + = positive Constitutional:   No-   weight loss, night sweats, fevers, chills, fatigue, lassitude. HEENT:   No-  headaches, difficulty swallowing, tooth/dental problems, sore throat,       No-  sneezing, itching, ear ache, nasal congestion, post nasal drip,  CV:   chest pain, orthopnea, PND, swelling in lower extremities, anasarca,                                                                    dizziness, +palpitations Resp: + shortness of breath with exertion or at rest.               productive cough,  + non-productive cough,  No- coughing up of blood.              No-   change in color of mucus.  + wheezing.   Skin: No-   rash or lesions. GI:  No-   heartburn, indigestion, abdominal pain, nausea, vomiting,  GU:  MS:  No-   joint pain or swelling.   Neuro-     nothing unusual Psych:  No- change in mood or affect. No depression or anxiety.  No memory loss.  OBJ- Physical Exam General- Alert, Oriented, Affect-appropriate, Distress- none acute, well appearing Skin- rash-none, lesions- none, excoriation- none Lymphadenopathy- none Head- atraumatic            Eyes- Gross vision intact, PERRLA, conjunctivae and secretions clear            Ears- Hearing, canals-normal            Nose-  clear, no-Septal dev, mucus, polyps, erosion, perforation             Throat- Mallampati II , mucosa clear , drainage- none, tonsils- atrophic Neck- flexible , trachea midline, no stridor , thyroid nl, carotid no bruit Chest - symmetrical excursion , unlabored           Heart/CV- RRR ,  no murmur , no gallop  , no rub, nl s1 s2                           - JVD- none , edema- none, stasis changes- none, varices- none           Lung- clear to P&A, wheeze- none, cough- none , dullness-none, rub- none           Chest wall-  Abd-  Br/ Gen/ Rectal- Not done, not indicated Extrem- cyanosis- none, clubbing, none, atrophy- none, strength- nl Neuro- grossly intact to observation

## 2017-08-12 NOTE — Patient Instructions (Signed)
We can continue Symbicort. Glad it is working so well for you.  Please call if we can help  Order- Schedule office spirometry for dx asthma mild uncomplicated   On arrival next visit

## 2017-09-15 NOTE — Assessment & Plan Note (Signed)
Good control using Symbicort 1 puff twice daily. We discussed continuing this maintenance therapy.

## 2017-09-15 NOTE — Assessment & Plan Note (Addendum)
No seasonal exacerbation yet this fall. Flonase and antihistamines will be first steps if needed.

## 2018-01-03 ENCOUNTER — Other Ambulatory Visit: Payer: Self-pay | Admitting: Internal Medicine

## 2018-01-03 MED ORDER — BUDESONIDE-FORMOTEROL FUMARATE 160-4.5 MCG/ACT IN AERO: INHALATION_SPRAY | RESPIRATORY_TRACT | 3 refills | Status: DC

## 2018-01-03 NOTE — Telephone Encounter (Signed)
Rx approved by [facility] for year. Rx sent electronically.

## 2018-02-10 ENCOUNTER — Encounter: Payer: Self-pay | Admitting: Internal Medicine

## 2018-02-10 ENCOUNTER — Ambulatory Visit (INDEPENDENT_AMBULATORY_CARE_PROVIDER_SITE_OTHER): Payer: Managed Care, Other (non HMO) | Admitting: Internal Medicine

## 2018-02-10 VITALS — BP 112/70 | HR 56 | Ht 64.0 in | Wt 146.0 lb

## 2018-02-10 DIAGNOSIS — J302 Other seasonal allergic rhinitis: Secondary | ICD-10-CM

## 2018-02-10 DIAGNOSIS — J3089 Other allergic rhinitis: Secondary | ICD-10-CM

## 2018-02-10 DIAGNOSIS — J4520 Mild intermittent asthma, uncomplicated: Secondary | ICD-10-CM

## 2018-02-10 NOTE — Progress Notes (Signed)
HPI F former smoker followed for Asthma, allergic rhinitis, complicated by celiac disease, endometriosis Skin tested positive but never on shots. Had tubes in ears, otherwise no ENT surgery.  Celiac Positive- avoids gluten.Denies GERD Office Spirometry 02/10/18-WNL-FEV1 3.70/100%, FEV1 3.13/105%, ratio 1.85, FEF 25-75% 3.76/122%  -----------------------------------------------------------------------------------  08/12/17- 42 year old former smoker followed for Asthma, allergic rhinitis, complicated by celiac disease, endometriosis  FOLLOWS FOR: Pt states she is doing well being on Symbicort inhaler. Denies any wheezing, cough, congestion, or allergy related issues/concerns. She thinks one puff of Symbicort twice daily works great, better than Qvar. 2 puffs associated with palpitations. No sleep disturbance and little need for rescue inhaler recently. CXR 04/09/17 IMPRESSION: Mild hyperinflation consistent with known reactive airway disease. No pneumonia, CHF, nor other acute cardiopulmonary abnormality.  02/10/18- 9 year old former smoker followed for Asthma, allergic rhinitis, complicated by celiac disease, endometriosis Feels stable. Occ forgets evening dose of Symbicort. Symbicort 160, Proair,  Has not been needing rescue inhaler at all.  No sleep disturbance.  Replaced carpet upstairs with hardwood and thinks that made a significant difference.  Much more stable.  Occasional palpitation relieved by walking-cardiology has seen..  We discussed availability of LAMA inhalers, Singulair and other approaches if asthma control conflicts with palpitation.  Spring pollen rhinitis has not yet flared. Office Spirometry 02/10/18-WNL-FEV1 3.70/100%, FEV1 3.13/105%, ratio 1.85, FEF 25-75% 3.76/122%  ROS-see HPI  + = positive Constitutional:   No-   weight loss, night sweats, fevers, chills, fatigue, lassitude. HEENT:   No-  headaches, difficulty swallowing, tooth/dental problems, sore throat,       No-   sneezing, itching, ear ache, nasal congestion, post nasal drip,  CV:   chest pain, orthopnea, PND, swelling in lower extremities, anasarca,                                                                   dizziness, +palpitations Resp: + shortness of breath with exertion or at rest.               productive cough,  + non-productive cough,  No- coughing up of blood.              No-   change in color of mucus.  + wheezing.   Skin: No-   rash or lesions. GI:  No-   heartburn, indigestion, abdominal pain, nausea, vomiting,  GU:  MS:  No-   joint pain or swelling.   Neuro-     nothing unusual Psych:  No- change in mood or affect. No depression or anxiety.  No memory loss.  OBJ- Physical Exam General- Alert, Oriented, Affect-appropriate, Distress- none acute, well appearing Skin- rash-none, lesions- none, excoriation- none Lymphadenopathy- none Head- atraumatic            Eyes- Gross vision intact, PERRLA, conjunctivae and secretions clear            Ears- Hearing, canals-normal            Nose-  clear, no-Septal dev, mucus, polyps, erosion, perforation             Throat- Mallampati II , mucosa clear , drainage- none, tonsils- atrophic Neck- flexible , trachea midline, no stridor , thyroid nl, carotid no bruit Chest - symmetrical excursion , unlabored  Heart/CV- RRR , no murmur , no gallop  , no rub, nl s1 s2                           - JVD- none , edema- none, stasis changes- none, varices- none           Lung- clear to P&A, wheeze- none, cough- none , dullness-none, rub- none           Chest wall-  Abd-  Br/ Gen/ Rectal- Not done, not indicated Extrem- cyanosis- none, clubbing, none, atrophy- none, strength- nl Neuro- grossly intact to observation

## 2018-02-10 NOTE — Assessment & Plan Note (Signed)
Doing very well without acute exacerbation.  Note her observation that she improved after replacing carpet upstairs and she expects potential exacerbation with spring pollen allergy.  This may guide further treatment if needed.  Trying to avoid beta stimulation because of her palpitations, we can add Singulair, LAMA's and consider a biologic if she ever needed that much intervention.

## 2018-02-10 NOTE — Assessment & Plan Note (Signed)
First-line therapy will be OTC antihistamines and Flonase if needed.

## 2018-02-10 NOTE — Patient Instructions (Signed)
Fine to continue the Symbicort as you are doing, with ProAir available if you need.  Please call if we can help.

## 2018-03-26 DIAGNOSIS — I2699 Other pulmonary embolism without acute cor pulmonale: Secondary | ICD-10-CM

## 2018-03-26 HISTORY — DX: Other pulmonary embolism without acute cor pulmonale: I26.99

## 2018-04-06 ENCOUNTER — Observation Stay (HOSPITAL_COMMUNITY)
Admission: EM | Admit: 2018-04-06 | Discharge: 2018-04-09 | DRG: 176 | Disposition: A | Discharge status: Home / Self Care | Payer: Managed Care, Other (non HMO) | Attending: Internal Medicine | Admitting: Internal Medicine

## 2018-04-06 ENCOUNTER — Encounter (HOSPITAL_COMMUNITY): Payer: Self-pay | Admitting: Emergency Medicine

## 2018-04-06 ENCOUNTER — Other Ambulatory Visit: Payer: Self-pay

## 2018-04-06 DIAGNOSIS — D72829 Elevated white blood cell count, unspecified: Secondary | ICD-10-CM | POA: Diagnosis not present

## 2018-04-06 DIAGNOSIS — Z79890 Hormone replacement therapy (postmenopausal): Secondary | ICD-10-CM | POA: Diagnosis not present

## 2018-04-06 DIAGNOSIS — Z825 Family history of asthma and other chronic lower respiratory diseases: Secondary | ICD-10-CM | POA: Diagnosis not present

## 2018-04-06 DIAGNOSIS — I2699 Other pulmonary embolism without acute cor pulmonale: Principal | ICD-10-CM | POA: Diagnosis present

## 2018-04-06 DIAGNOSIS — Z87891 Personal history of nicotine dependence: Secondary | ICD-10-CM

## 2018-04-06 DIAGNOSIS — I82422 Acute embolism and thrombosis of left iliac vein: Secondary | ICD-10-CM | POA: Diagnosis present

## 2018-04-06 DIAGNOSIS — E860 Dehydration: Secondary | ICD-10-CM | POA: Diagnosis present

## 2018-04-06 DIAGNOSIS — Z800 Family history of malignant neoplasm of digestive organs: Secondary | ICD-10-CM | POA: Diagnosis not present

## 2018-04-06 DIAGNOSIS — J45909 Unspecified asthma, uncomplicated: Secondary | ICD-10-CM | POA: Diagnosis present

## 2018-04-06 DIAGNOSIS — K900 Celiac disease: Secondary | ICD-10-CM | POA: Diagnosis present

## 2018-04-06 DIAGNOSIS — N8110 Cystocele, unspecified: Secondary | ICD-10-CM | POA: Diagnosis present

## 2018-04-06 DIAGNOSIS — R1011 Right upper quadrant pain: Secondary | ICD-10-CM

## 2018-04-06 DIAGNOSIS — Z8249 Family history of ischemic heart disease and other diseases of the circulatory system: Secondary | ICD-10-CM | POA: Diagnosis not present

## 2018-04-06 DIAGNOSIS — N809 Endometriosis, unspecified: Secondary | ICD-10-CM | POA: Diagnosis present

## 2018-04-06 HISTORY — DX: Other pulmonary embolism without acute cor pulmonale: I26.99

## 2018-04-06 HISTORY — DX: Family history of other specified conditions: Z84.89

## 2018-04-06 HISTORY — DX: Headache: R51

## 2018-04-06 HISTORY — DX: Headache, unspecified: R51.9

## 2018-04-06 LAB — COMPREHENSIVE METABOLIC PANEL
ALBUMIN: 3.6 g/dL (ref 3.5–5.0)
ALT: 15 U/L (ref 14–54)
AST: 13 U/L — AB (ref 15–41)
Alkaline Phosphatase: 57 U/L (ref 38–126)
Anion gap: 11 (ref 5–15)
BUN: 12 mg/dL (ref 6–20)
CHLORIDE: 105 mmol/L (ref 101–111)
CO2: 21 mmol/L — AB (ref 22–32)
Calcium: 8.7 mg/dL — ABNORMAL LOW (ref 8.9–10.3)
Creatinine, Ser: 0.76 mg/dL (ref 0.44–1.00)
GFR calc Af Amer: >60 mL/min (ref >60)
GFR calc non Af Amer: >60 mL/min (ref >60)
Glucose, Bld: 104 mg/dL — ABNORMAL HIGH (ref 65–99)
Potassium: 3.7 mmol/L (ref 3.5–5.1)
SODIUM: 137 mmol/L (ref 135–145)
Total Bilirubin: 0.9 mg/dL (ref 0.3–1.2)
Total Protein: 7.2 g/dL (ref 6.5–8.1)

## 2018-04-06 LAB — URINALYSIS, ROUTINE W REFLEX MICROSCOPIC
Bilirubin Urine: NEGATIVE
Glucose, UA: NEGATIVE mg/dL
Hgb urine dipstick: NEGATIVE
KETONES UR: NEGATIVE mg/dL
Leukocytes, UA: NEGATIVE
Nitrite: NEGATIVE
PH: 8 (ref 5.0–8.0)
Protein, ur: NEGATIVE mg/dL
SPECIFIC GRAVITY, URINE: 1.021 (ref 1.005–1.030)

## 2018-04-06 LAB — I-STAT BETA HCG BLOOD, ED (MC, WL, AP ONLY): I-stat hCG, quantitative: <5 m[IU]/mL (ref <5)

## 2018-04-06 LAB — CBC
HEMATOCRIT: 41.9 % (ref 36.0–46.0)
Hemoglobin: 14.4 g/dL (ref 12.0–15.0)
MCH: 32.3 pg (ref 26.0–34.0)
MCHC: 34.4 g/dL (ref 30.0–36.0)
MCV: 93.9 fL (ref 78.0–100.0)
Platelets: 184 10*3/uL (ref 150–400)
RBC: 4.46 MIL/uL (ref 3.87–5.11)
RDW: 12.7 % (ref 11.5–15.5)
WBC: 12.4 10*3/uL — ABNORMAL HIGH (ref 4.0–10.5)

## 2018-04-06 LAB — I-STAT TROPONIN, ED: Troponin i, poc: 0 ng/mL (ref 0.00–0.08)

## 2018-04-06 LAB — LIPASE, BLOOD: Lipase: 28 U/L (ref 11–51)

## 2018-04-06 NOTE — ED Triage Notes (Signed)
Pt c/o 10/10 epigastric pain radiating to shoulder and chest with some SOB no nausea or vomiting.

## 2018-04-07 ENCOUNTER — Emergency Department (HOSPITAL_COMMUNITY): Payer: Managed Care, Other (non HMO)

## 2018-04-07 ENCOUNTER — Encounter (HOSPITAL_COMMUNITY): Payer: Self-pay | Admitting: Emergency Medical Services

## 2018-04-07 DIAGNOSIS — N809 Endometriosis, unspecified: Secondary | ICD-10-CM | POA: Diagnosis not present

## 2018-04-07 DIAGNOSIS — I2699 Other pulmonary embolism without acute cor pulmonale: Secondary | ICD-10-CM | POA: Diagnosis present

## 2018-04-07 LAB — HEPARIN LEVEL (UNFRACTIONATED): HEPARIN UNFRACTIONATED: 0.28 [IU]/mL — AB (ref 0.30–0.70)

## 2018-04-07 LAB — I-STAT TROPONIN, ED: TROPONIN I, POC: 0 ng/mL (ref 0.00–0.08)

## 2018-04-07 LAB — D-DIMER, QUANTITATIVE (NOT AT ARMC): D DIMER QUANT: 2.35 ug{FEU}/mL — AB (ref 0.00–0.50)

## 2018-04-07 LAB — ANTITHROMBIN III: AntiThromb III Func: 115 % (ref 75–120)

## 2018-04-07 MED ORDER — IOPAMIDOL (ISOVUE-370) INJECTION 76%
100.0000 mL | Freq: Once | INTRAVENOUS | Status: AC | PRN
  Administered 2018-04-07: 100 mL via INTRAVENOUS

## 2018-04-07 MED ORDER — HYDROMORPHONE HCL 2 MG/ML IJ SOLN
1.0000 mg | Freq: Once | INTRAMUSCULAR | Status: AC
  Administered 2018-04-07: 1 mg via INTRAVENOUS
  Filled 2018-04-07: qty 1

## 2018-04-07 MED ORDER — SODIUM CHLORIDE 0.9% FLUSH
3.0000 mL | Freq: Two times a day (BID) | INTRAVENOUS | Status: DC
  Administered 2018-04-07 – 2018-04-09 (×4): 3 mL via INTRAVENOUS

## 2018-04-07 MED ORDER — ACETAMINOPHEN 650 MG RE SUPP: 650.0000 mg | Freq: Four times a day (QID) | RECTAL | Status: DC | PRN

## 2018-04-07 MED ORDER — DIPHENHYDRAMINE HCL 50 MG/ML IJ SOLN
25.0000 mg | Freq: Once | INTRAMUSCULAR | Status: AC
  Administered 2018-04-07: 25 mg via INTRAVENOUS
  Filled 2018-04-07: qty 1

## 2018-04-07 MED ORDER — MOMETASONE FURO-FORMOTEROL FUM 200-5 MCG/ACT IN AERO
2.0000 | INHALATION_SPRAY | Freq: Two times a day (BID) | RESPIRATORY_TRACT | Status: DC
  Administered 2018-04-08 – 2018-04-09 (×3): 2 via RESPIRATORY_TRACT
  Filled 2018-04-07: qty 8.8

## 2018-04-07 MED ORDER — IOPAMIDOL (ISOVUE-370) INJECTION 76%
INTRAVENOUS | Status: AC
  Filled 2018-04-07: qty 100

## 2018-04-07 MED ORDER — ONDANSETRON HCL 4 MG/2ML IJ SOLN
4.0000 mg | Freq: Once | INTRAMUSCULAR | Status: AC
  Administered 2018-04-07: 4 mg via INTRAVENOUS
  Filled 2018-04-07: qty 2

## 2018-04-07 MED ORDER — DOCUSATE SODIUM 100 MG PO CAPS
100.0000 mg | ORAL_CAPSULE | Freq: Two times a day (BID) | ORAL | Status: DC
  Administered 2018-04-07 – 2018-04-09 (×4): 100 mg via ORAL
  Filled 2018-04-07 (×4): qty 1

## 2018-04-07 MED ORDER — ONDANSETRON HCL 4 MG/2ML IJ SOLN: 4.0000 mg | Freq: Four times a day (QID) | INTRAMUSCULAR | Status: DC | PRN

## 2018-04-07 MED ORDER — HEPARIN (PORCINE) IN NACL 100-0.45 UNIT/ML-% IJ SOLN
1200.0000 [IU]/h | INTRAMUSCULAR | Status: DC
  Administered 2018-04-07: 1050 [IU]/h via INTRAVENOUS
  Administered 2018-04-08: 1200 [IU]/h via INTRAVENOUS
  Filled 2018-04-07 (×2): qty 250

## 2018-04-07 MED ORDER — LACTATED RINGERS IV SOLN
INTRAVENOUS | Status: DC
  Administered 2018-04-07: 50 mL via INTRAVENOUS
  Administered 2018-04-08: 50 mL/h via INTRAVENOUS

## 2018-04-07 MED ORDER — ALPRAZOLAM 0.25 MG PO TABS: 0.1250 mg | ORAL_TABLET | Freq: Two times a day (BID) | ORAL | Status: DC | PRN

## 2018-04-07 MED ORDER — HEPARIN BOLUS VIA INFUSION
3200.0000 [IU] | Freq: Once | INTRAVENOUS | Status: AC
  Administered 2018-04-07: 3200 [IU] via INTRAVENOUS
  Filled 2018-04-07: qty 3200

## 2018-04-07 MED ORDER — FLUTICASONE PROPIONATE 50 MCG/ACT NA SUSP
2.0000 | Freq: Every day | NASAL | Status: DC | PRN
  Filled 2018-04-07: qty 16

## 2018-04-07 MED ORDER — ONDANSETRON HCL 4 MG PO TABS: 4.0000 mg | ORAL_TABLET | Freq: Four times a day (QID) | ORAL | Status: DC | PRN

## 2018-04-07 MED ORDER — MORPHINE SULFATE (PF) 2 MG/ML IV SOLN
2.0000 mg | INTRAVENOUS | Status: DC | PRN
  Administered 2018-04-08 (×7): 2 mg via INTRAVENOUS
  Filled 2018-04-07 (×7): qty 1

## 2018-04-07 MED ORDER — ACETAMINOPHEN 325 MG PO TABS
650.0000 mg | ORAL_TABLET | Freq: Four times a day (QID) | ORAL | Status: DC | PRN
  Administered 2018-04-07: 650 mg via ORAL
  Filled 2018-04-07: qty 2

## 2018-04-07 MED ORDER — ALBUTEROL SULFATE (2.5 MG/3ML) 0.083% IN NEBU: 3.0000 mL | INHALATION_SOLUTION | Freq: Four times a day (QID) | RESPIRATORY_TRACT | Status: DC | PRN

## 2018-04-07 NOTE — Progress Notes (Signed)
ANTICOAGULATION CONSULT NOTE - Initial Consult  Pharmacy Consult for heparin  Indication: pulmonary embolus  No Known Allergies  Patient Measurements: Height: 5' 4"  (162.6 cm) Weight: 140 lb (63.5 kg) IBW/kg (Calculated) : 54.7 Heparin Dosing Weight: 63.5 kg  Vital Signs: BP: 112/69 (05/13 1030) Pulse Rate: 92 (05/13 1030)  Labs: Recent Labs    04/06/18 2237  HGB 14.4  HCT 41.9  PLT 184  CREATININE 0.76    Estimated Creatinine Clearance: 79.9 mL/min (by C-G formula based on SCr of 0.76 mg/dL).   Medical History: Past Medical History:  Diagnosis Date  . Asthma   . Celiac disease   . Endometriosis   . Environmental allergies   . Hx of migraine headaches   . Hypoglycemia   . Low back pain   . Retinal edema   . Tinnitus     Medications:  Scheduled:  . heparin  3,200 Units Intravenous Once  . iopamidol        Assessment: 71 yof presenting with abdominal pain and SOB. No known hx of VTE, no anticoag PTA. Hgb 14.4, plts 184, D-dimer 2.35. CTA showing occlusive PE in R lower lobe pulmonary arteries.   Goal of Therapy:  Heparin level 0.3-0.7 units/ml Monitor platelets by anticoagulation protocol: Yes   Plan:  Give 3200 units bolus x 1 Start heparin infusion at 1050 units/hr Check anti-Xa level in 6 hours and daily while on heparin Continue to monitor H&H and platelets  Doylene Canard, PharmD Clinical Pharmacist  Pager: [phone] Phone: [phone] 04/07/2018,11:07 AM

## 2018-04-07 NOTE — H&P (Signed)
History and Physical    Sierra Olsen OZD:[id number] DOB: [birth year] DOA: 04/06/2018  PCP: Marda Stalker, PA-C Consultants:  Annamaria Boots - pulmonology; Clide Dales - endometriosis specialist at [place] Patient coming from:  Home - lives with husband and daughter (41yo); NOK: Husband, [phone]  Chief Complaint: Abdominal pain  HPI: Sierra Olsen is a 42 y.o. female with medical history significant of back pain; celiac disease; endometriosis with a cystocele; and asthma presenting with abdominal pain.  Generalized abdominal discomfort for a couple of weeks.  Achy yesterday and felt dehydrated and drank some water.  Began to feel more achy in her back.  Took a long nap, thought maybe it was a virus.  After dinner (ate well and drank some wine and water), she developed right sided pain and it escalated quickly.  She couldn't get on/off the toilet by herself due to the pain.  +SOB.  They drove to the ER and her sister came to watch her daughter.  No DVT symptoms.  She "just felt puffy all over" over the last few weeks - she thought maybe she was bloated, eating out too much.  Alternating with diarrhea and constipation, thought it was related to recent travel ([place]).  She noticed SOB with dehydration and pain.  Also with tachycardia yesterday, 79-89 - usual resting heart rate on her Fitbit is 59-60.  Earlier this week, she leaned forward and felt very light-headed.  She started on progesterone in October. They were uising progesterone to bridge to hysterectomy.  She is active and mobile.   ED Course:   DVT/PE.  Generally well, recently started progesterone for endometriosis.  Review of Systems: As per HPI; otherwise review of systems reviewed and negative.   Ambulatory Status:  Ambulates without assistance  Past Medical History:  Diagnosis Date  . Asthma   . Celiac disease   . Endometriosis   . Environmental allergies   . Hx of migraine headaches   . Hypoglycemia   . Low back pain   . Retinal edema   .  Tinnitus     Past Surgical History:  Procedure Laterality Date  . DILATION AND CURETTAGE OF UTERUS    . tubes in ears     age 21  . WISDOM TOOTH EXTRACTION      Social History   Socioeconomic History  . Marital status: Married    Spouse name: Not on file  . Number of children: 1  . Years of education: Not on file  . Highest education level: Not on file  Occupational History  . Occupation: clergy  Social Needs  . Financial resource strain: Not on file  . Food insecurity:    Worry: Not on file    Inability: Not on file  . Transportation needs:    Medical: Not on file    Non-medical: Not on file  Tobacco Use  . Smoking status: Former Smoker    Packs/day: 1.00    Years: 5.00    Pack years: 5.00    Types: Cigarettes    Last attempt to quit: 11/26/2001    Years since quitting: 16.3  . Smokeless tobacco: Never Used  Substance and Sexual Activity  . Alcohol use: Yes    Alcohol/week: 4.2 oz    Types: 7 Glasses of wine per week    Comment: weekly  . Drug use: No  . Sexual activity: Not on file  Lifestyle  . Physical activity:    Days per week: Not on file    Minutes per  session: Not on file  . Stress: Not on file  Relationships  . Social connections:    Talks on phone: Not on file    Gets together: Not on file    Attends religious service: Not on file    Active member of club or organization: Not on file    Attends meetings of clubs or organizations: Not on file    Relationship status: Not on file  . Intimate partner violence:    Fear of current or ex partner: Not on file    Emotionally abused: Not on file    Physically abused: Not on file    Forced sexual activity: Not on file  Other Topics Concern  . Not on file  Social History Narrative  . Not on file    No Known Allergies  Family History  Problem Relation Age of Onset  . Asthma Mother   . Emphysema Maternal Grandmother        smoker  . Emphysema Maternal Grandfather   . Colon cancer Paternal  Grandmother   . Heart disease Paternal Grandfather   . Heart attack Paternal Grandfather   . Allergies Unknown        both sides of family  . Asthma Brother   . Breast cancer Unknown        father side  . Migraines Sister   . Endometriosis Sister   . Allergies Sister   . Allergies Brother     Prior to Admission medications   Medication Sig Start Date End Date Taking? Authorizing Provider  albuterol (PROAIR HFA) 108 (90 Base) MCG/ACT inhaler 2 puffs every 6 hours as needed Patient taking differently: Inhale 2 puffs into the lungs every 6 (six) hours as needed for wheezing.  04/09/17  Yes Young, Tarri Fuller D, MD  ALPRAZolam Duanne Moron) 0.25 MG tablet Take 0.125 mg by mouth 2 (two) times daily as needed for anxiety. Take 1/2 tablet BID prn    Yes [provider]  budesonide-formoterol (SYMBICORT) 160-4.5 MCG/ACT inhaler Inhale 2 puffs then rinse mouth, twice daily 01/03/18  Yes Young, Clinton D, MD  mometasone (NASONEX) 50 MCG/ACT nasal spray Place 2 sprays into the nose daily as needed (allergies).    Yes [provider]  Progesterone Micronized (PROGESTERONE PO) Take 5 mg by mouth daily.   Yes [provider]  Spacer/Aero-Holding Chambers (AEROCHAMBER MV) inhaler Use as instructed 05/10/14  Yes Baird Lyons D, MD    Physical Exam: Vitals:   04/07/18 1100 04/07/18 1130 04/07/18 1230 04/07/18 1330  BP: 120/69 118/74 112/72 111/72  Pulse: 84 83 91 89  Resp: (!) 22 (!) 23 19 (!) 21  Temp:      TempSrc:      SpO2: 93% 95% 94% 96%  Weight:      Height:         General:  Appears calm and comfortable and is NAD Eyes:  PERRL, EOMI, normal lids, iris ENT:  grossly normal hearing, lips & tongue, mmm; appropriate dentition Neck:  no LAD, masses or thyromegaly; no carotid bruits Cardiovascular:  RRR, no m/r/g. No LE edema.  Respiratory:   CTA bilaterally with no wheezes/rales/rhonchi.  Normal respiratory effort. Abdomen:  soft, NT, ND, NABS Back:   normal alignment,  no CVAT Skin:  no rash or induration seen on limited exam Musculoskeletal:  grossly normal tone BUE/BLE, good ROM, no bony abnormality Lower extremity:  No LE edema.  Limited foot exam with no ulcerations.  2+ distal pulses. Psychiatric:  grossly normal mood and affect, speech fluent and appropriate, AOx3 Neurologic:  CN 2-12 grossly intact, moves all extremities in coordinated fashion, sensation intact    Radiological Exams on Admission: Dg Chest 2 View  Result Date: 04/07/2018 CLINICAL DATA:  Acute onset of right upper quadrant abdominal pain and generalized chest pain. Shortness of breath. EXAM: CHEST - 2 VIEW COMPARISON:  Chest radiograph performed 04/09/2017 FINDINGS: The lungs are well-aerated. Minimal bibasilar opacities could reflect atelectasis or mild interstitial edema. There is no evidence of pleural effusion or pneumothorax. The heart is normal in size; the mediastinal contour is within normal limits. No acute osseous abnormalities are seen. IMPRESSION: Minimal bibasilar opacities could reflect atelectasis or mild interstitial edema. Electronically Signed   By: Garald Balding M.D.   On: 04/07/2018 04:32   Ct Angio Chest Pe W And/or Wo Contrast  Result Date: 04/07/2018 CLINICAL DATA:  Epigastric pain radiating to right chest and shoulders. Shortness of breath. EXAM: CT ANGIOGRAPHY CHEST CT ABDOMEN AND PELVIS WITH CONTRAST TECHNIQUE: Multidetector CT imaging of the chest was performed using the standard protocol during bolus administration of intravenous contrast. Multiplanar CT image reconstructions and MIPs were obtained to evaluate the vascular anatomy. Multidetector CT imaging of the abdomen and pelvis was performed using the standard protocol during bolus administration of intravenous contrast. CONTRAST:  145m ISOVUE-370 IOPAMIDOL (ISOVUE-370) INJECTION 76% COMPARISON:  None. FINDINGS: CTA CHEST FINDINGS Cardiovascular: Occlusive emboli noted within the right lower lobe pulmonary  arteries. No additional pulmonary emboli noted. No evidence of right heart strain. Heart is normal size. Aorta is normal caliber. Mediastinum/Nodes: No mediastinal, hilar, or axillary adenopathy. Lungs/Pleura: Dependent atelectasis in the lower lobes. No effusions. Musculoskeletal: No acute bony abnormality. Review of the MIP images confirms the above findings. CT ABDOMEN and PELVIS FINDINGS Hepatobiliary: Scattered hepatic cysts appear benign. Gallbladder unremarkable. No biliary duct dilatation. Pancreas: No focal abnormality or ductal dilatation. Spleen: No focal abnormality.  Normal size. Adrenals/Urinary Tract: No adrenal abnormality. No focal renal abnormality. No stones or hydronephrosis. Urinary bladder is unremarkable. Stomach/Bowel: Stomach, large and small bowel grossly unremarkable. Appendix not definitively seen. No inflammatory process in the right lower quadrant. Vascular/Lymphatic: Filling defect noted within the left common iliac vein and extending into the left internal iliac vein compatible with deep venous thrombosis. No aneurysm or adenopathy. Reproductive: Uterus and adnexa unremarkable.  No mass. Other: No free fluid or free air. Musculoskeletal: No acute bony abnormality. Review of the MIP images confirms the above findings. IMPRESSION: Occlusive pulmonary emboli in the right lower lobe pulmonary arteries. Dependent atelectasis in the lower lobes. Deep venous thrombosis within the left common iliac vein extending into the left internal iliac vein. These results were called by telephone at the time of interpretation on 04/07/2018 at 10:23 am to Dr. MMassie Maroon, who verbally acknowledged these results. Electronically Signed   By: KRolm BaptiseM.D.   On: 04/07/2018 10:26   Ct Abdomen Pelvis W Contrast  Result Date: 04/07/2018 CLINICAL DATA:  Epigastric pain radiating to right chest and shoulders. Shortness of breath. EXAM: CT ANGIOGRAPHY CHEST CT ABDOMEN AND PELVIS WITH CONTRAST TECHNIQUE:  Multidetector CT imaging of the chest was performed using the standard protocol during bolus administration of intravenous contrast. Multiplanar CT image reconstructions and MIPs were obtained to evaluate the vascular anatomy. Multidetector CT imaging of the abdomen and pelvis was performed using the standard protocol during bolus administration of intravenous contrast. CONTRAST:  1031mISOVUE-370 IOPAMIDOL (ISOVUE-370) INJECTION 76% COMPARISON:  None. FINDINGS: CTA CHEST  FINDINGS Cardiovascular: Occlusive emboli noted within the right lower lobe pulmonary arteries. No additional pulmonary emboli noted. No evidence of right heart strain. Heart is normal size. Aorta is normal caliber. Mediastinum/Nodes: No mediastinal, hilar, or axillary adenopathy. Lungs/Pleura: Dependent atelectasis in the lower lobes. No effusions. Musculoskeletal: No acute bony abnormality. Review of the MIP images confirms the above findings. CT ABDOMEN and PELVIS FINDINGS Hepatobiliary: Scattered hepatic cysts appear benign. Gallbladder unremarkable. No biliary duct dilatation. Pancreas: No focal abnormality or ductal dilatation. Spleen: No focal abnormality.  Normal size. Adrenals/Urinary Tract: No adrenal abnormality. No focal renal abnormality. No stones or hydronephrosis. Urinary bladder is unremarkable. Stomach/Bowel: Stomach, large and small bowel grossly unremarkable. Appendix not definitively seen. No inflammatory process in the right lower quadrant. Vascular/Lymphatic: Filling defect noted within the left common iliac vein and extending into the left internal iliac vein compatible with deep venous thrombosis. No aneurysm or adenopathy. Reproductive: Uterus and adnexa unremarkable.  No mass. Other: No free fluid or free air. Musculoskeletal: No acute bony abnormality. Review of the MIP images confirms the above findings. IMPRESSION: Occlusive pulmonary emboli in the right lower lobe pulmonary arteries. Dependent atelectasis in the lower  lobes. Deep venous thrombosis within the left common iliac vein extending into the left internal iliac vein. These results were called by telephone at the time of interpretation on 04/07/2018 at 10:23 am to Dr. Massie Maroon , who verbally acknowledged these results. Electronically Signed   By: Rolm Baptise M.D.   On: 04/07/2018 10:26   US Abdomen Limited Ruq  Result Date: 04/07/2018 CLINICAL DATA:  RIGHT upper quadrant and back pain tonight. History of celiac disease. EXAM: ULTRASOUND ABDOMEN LIMITED RIGHT UPPER QUADRANT COMPARISON:  Abdominal ultrasound February 17, 2016 FINDINGS: Gallbladder: No gallstones or wall thickening visualized. No sonographic Murphy sign noted by sonographer. Common bile duct: Diameter: 3 mm Liver: No focal lesion identified. Within normal limits in parenchymal echogenicity. Portal vein is patent on color Doppler imaging with normal direction of blood flow towards the liver. IMPRESSION: Negative RIGHT upper quadrant ultrasound. Electronically Signed   By: Elon Alas M.D.   On: 04/07/2018 06:11    EKG: Independently reviewed.  Sinus tachycardia with rate 101; otherwise normal EKG  Labs on Admission: I have personally reviewed the available labs and imaging studies at the time of the admission.  Pertinent labs:   D-dimer 2.35 Troponin 0 CO2 21 Glucose 104 WBC 12.4 CBC otherwise normal HCG negative UA WNL  Assessment/Plan Principal Problem:   Pulmonary embolism (HCC) Active Problems:   Endometriosis   Pulmonary emolism -Patient without prior episodes of thromboembolic disease presenting with new DVT/PE -She has no apparent risk factors other than progesterone therapy - will have to discontinue this medication -Will observe overnight on telemetry -Initiate anticoagulation - for now, ER has started a heparin drip -She is likely able to transition to alternative [facility] agent tomorrow - CM consult placed to determine most affordable option based on her insurance  carrier -Hypercoagulable testing is not wholly effective in the setting of an acute clot, but it is reasonable to perform the tests now and consider repeat of Proteins C and S when not in the acute setting -The patient understands that thromboembolic disease can be catastrophic and even deadly and that she must be complaint with physician appointments and  Anticoagulation. -She will need at least 3 months of AC; will defer to PCP about total duration of treatment.  Endometriosis -Unfortunately, it appears that progesterone was salvage therapy before hysterectomy -  She is very likely to need surgical intervention - while it would be ideal to wait until she is off [facility] therapy, her menorrhagia may force the decision sooner. -She may require oophorectomy as well due to her endometriosis and we discussed this issue, as well -She will plan to f/u with Dr. Clide Dales at [facility] or to find a local OB/GYN   DVT prophylaxis: Heparin Code Status:  Full - confirmed with patient/family Family Communication: Husband and her friend/coworker present for the entire encounter  Disposition Plan:  Home once clinically improved Consults called: None  Admission status: It is my clinical opinion that referral for OBSERVATION is reasonable and necessary in this patient based on the above information provided. The aforementioned taken together are felt to place the patient at high risk for further clinical deterioration. However it is anticipated that the patient may be medically stable for discharge from the hospital within 24 to 48 hours.    Karmen Bongo MD [facility]  If note is complete, please contact covering daytime or nighttime physician. [email or web address] Password [email or web address]  04/07/2018, 2:06 PM

## 2018-04-07 NOTE — ED Provider Notes (Signed)
[facility] EMERGENCY DEPARTMENT Provider Note   CSN: [id number] Arrival date & time: 04/06/18  2213     History   Chief Complaint Chief Complaint  Patient presents with  . Abdominal Pain    HPI Sierra Olsen is a 42 y.o. female.  HPI  Sierra Olsen is a 42yo female with a history of endometriosis, celiac disease, asthma, migraines who presents to the emergency department for evaluation of right upper quadrant pain and shortness of breath.  Patient reports that she has had pain over her right diaphragm for several years now.  States she has flares of pain with her endometriosis.  Reports that today after eating sushi, she developed right upper quadrant pain which radiates to the right back and shoulder.  States the pain is 7/10 in severity and feels sharp in nature.  Pain has been constant.  It is worsened with taking a deep breath, laying on the right side or with movement.  She also endorses feeling short of breath.  Denies recent cough, wheezing.  States that she feels very anxious.  Has not taken any over-the-counter medications for her pain.  She had a laparoscopic procedure for her endometriosis in 2009, denies any other previous abdominal surgeries.  She denies fever, chills, nausea/vomiting, diarrhea, melena, hematochezia, urinary frequency, dysuria, hematuria, vaginal discharge, chest pain, diaphoresis, lightheadedness.  She denies history of DVT/PE, recent leg swelling/calf tenderness, recent surgery, immobilization, active cancer.  She does take progesterone for her endometriosis.  Past Medical History:  Diagnosis Date  . Asthma   . Celiac disease   . Endometriosis   . Environmental allergies   . Hx of migraine headaches   . Hypoglycemia   . Low back pain   . Retinal edema   . Tinnitus     Patient Active Problem List   Diagnosis Date Noted  . Chest pain of uncertain etiology 75/[id number]/6384  . Asthma, mild intermittent, well-controlled 03/24/2014  .  Seasonal and perennial allergic rhinitis 03/24/2014    Past Surgical History:  Procedure Laterality Date  . DILATION AND CURETTAGE OF UTERUS    . tubes in ears     age 69  . WISDOM TOOTH EXTRACTION       OB History   None      Home Medications    Prior to Admission medications   Medication Sig Start Date End Date Taking? Authorizing Provider  albuterol (PROAIR HFA) 108 (90 Base) MCG/ACT inhaler 2 puffs every 6 hours as needed 04/09/17   Baird Lyons D, MD  ALPRAZolam Duanne Moron) 0.25 MG tablet Take 1/2 tablet BID prn    [provider]  budesonide-formoterol (SYMBICORT) 160-4.5 MCG/ACT inhaler Inhale 2 puffs then rinse mouth, twice daily 01/03/18   Baird Lyons D, MD  mometasone (NASONEX) 50 MCG/ACT nasal spray Place 2 sprays into the nose daily.    [provider]  Progesterone Micronized (PROGESTERONE PO) Take 5 mg by mouth daily.    [provider]  Spacer/Aero-Holding Chambers (AEROCHAMBER MV) inhaler Use as instructed 05/10/14   Deneise Lever, MD    Family History Family History  Problem Relation Age of Onset  . Asthma Mother   . Emphysema Maternal Grandmother        smoker  . Emphysema Maternal Grandfather   . Colon cancer Paternal Grandmother   . Heart disease Paternal Grandfather   . Heart attack Paternal Grandfather   . Allergies Unknown        both sides of family  .  Asthma Brother   . Breast cancer Unknown        father side  . Migraines Sister   . Endometriosis Sister   . Allergies Sister   . Allergies Brother     Social History Social History   Tobacco Use  . Smoking status: Former Smoker    Packs/day: 1.00    Years: 5.00    Pack years: 5.00    Types: Cigarettes    Last attempt to quit: 11/26/2001    Years since quitting: 16.3  . Smokeless tobacco: Never Used  Substance Use Topics  . Alcohol use: Yes    Comment: 1-3 weekly  . Drug use: No     Allergies   Patient has no known allergies.   Review of  Systems Review of Systems  Constitutional: Negative for chills and fever.  HENT: Negative for congestion and sore throat.   Respiratory: Positive for shortness of breath. Negative for cough, chest tightness and wheezing.   Cardiovascular: Negative for chest pain and leg swelling.  Gastrointestinal: Positive for abdominal pain (ruq). Negative for anal bleeding, diarrhea, nausea and vomiting.  Genitourinary: Negative for difficulty urinating, dysuria, frequency, hematuria, vaginal bleeding and vaginal discharge.  Musculoskeletal: Positive for back pain (right side).  Skin: Negative for rash.  Neurological: Negative for headaches.  Psychiatric/Behavioral: The patient is nervous/anxious.      Physical Exam Updated Vital Signs BP 121/72   Pulse 94   Temp 98.8 F (37.1 C) (Oral)   Resp (!) 24   Ht 5' 4"  (1.626 m)   Wt 63.5 kg (140 lb)   SpO2 97%   BMI 24.03 kg/m   Physical Exam  Constitutional: She is oriented to person, place, and time. She appears well-developed and well-nourished. No distress.  Curled over on her side, appears painful.  HENT:  Head: Normocephalic and atraumatic.  Mouth/Throat: Oropharynx is clear and moist. No oropharyngeal exudate.  Eyes: Pupils are equal, round, and reactive to light. Conjunctivae are normal. Right eye exhibits no discharge. Left eye exhibits no discharge.  Neck: No JVD present.  Cardiovascular: Normal rate, regular rhythm and intact distal pulses.  Pulmonary/Chest: Effort normal and breath sounds normal. No stridor. No respiratory distress. She has no wheezes. She has no rales.  Tender to palpation over right lower rib cage.  No overlying ecchymosis.  Abdominal:  Abdomen soft and nondistended.  Patient acutely tender to palpation in the right upper quadrant.  Negative McBurney's point.  No CVA tenderness.  Musculoskeletal:  No midline T-spine or L-spine tenderness.  No leg swelling or calf tenderness.  Neurological: She is alert and  oriented to person, place, and time. Coordination normal.  Skin: Skin is warm and dry. She is not diaphoretic.  Psychiatric: She has a normal mood and affect. Her behavior is normal.  Nursing note and vitals reviewed.    ED Treatments / Results  Labs (all labs ordered are listed, but only abnormal results are displayed) Labs Reviewed  COMPREHENSIVE METABOLIC PANEL - Abnormal; Notable for the following components:      Result Value   CO2 21 (*)    Glucose, Bld 104 (*)    Calcium 8.7 (*)    AST 13 (*)    All other components within normal limits  CBC - Abnormal; Notable for the following components:   WBC 12.4 (*)    All other components within normal limits  D-DIMER, QUANTITATIVE (NOT AT [facility]) - Abnormal; Notable for the following components:   D-Dimer, Quant  2.35 (*)    All other components within normal limits  LIPASE, BLOOD  URINALYSIS, ROUTINE W REFLEX MICROSCOPIC  I-STAT BETA HCG BLOOD, ED ([facility], [facility], [facility] ONLY)  I-STAT TROPONIN, ED  I-STAT TROPONIN, ED    EKG None  Radiology Dg Chest 2 View  Result Date: 04/07/2018 CLINICAL DATA:  Acute onset of right upper quadrant abdominal pain and generalized chest pain. Shortness of breath. EXAM: CHEST - 2 VIEW COMPARISON:  Chest radiograph performed 04/09/2017 FINDINGS: The lungs are well-aerated. Minimal bibasilar opacities could reflect atelectasis or mild interstitial edema. There is no evidence of pleural effusion or pneumothorax. The heart is normal in size; the mediastinal contour is within normal limits. No acute osseous abnormalities are seen. IMPRESSION: Minimal bibasilar opacities could reflect atelectasis or mild interstitial edema. Electronically Signed   By: Garald Balding M.D.   On: 04/07/2018 04:32   US Abdomen Limited Ruq  Result Date: 04/07/2018 CLINICAL DATA:  RIGHT upper quadrant and back pain tonight. History of celiac disease. EXAM: ULTRASOUND ABDOMEN LIMITED RIGHT UPPER QUADRANT COMPARISON:  Abdominal ultrasound  February 17, 2016 FINDINGS: Gallbladder: No gallstones or wall thickening visualized. No sonographic Murphy sign noted by sonographer. Common bile duct: Diameter: 3 mm Liver: No focal lesion identified. Within normal limits in parenchymal echogenicity. Portal vein is patent on color Doppler imaging with normal direction of blood flow towards the liver. IMPRESSION: Negative RIGHT upper quadrant ultrasound. Electronically Signed   By: Elon Alas M.D.   On: 04/07/2018 06:11    Procedures Procedures (including critical care time)  Medications Ordered in ED Medications  iopamidol (ISOVUE-370) 76 % injection 100 mL (has no administration in time range)  iopamidol (ISOVUE-370) 76 % injection (has no administration in time range)  diphenhydrAMINE (BENADRYL) injection 25 mg (has no administration in time range)  HYDROmorphone (DILAUDID) injection 1 mg (1 mg Intravenous Given 04/07/18 0453)  ondansetron (ZOFRAN) injection 4 mg (4 mg Intravenous Given 04/07/18 0454)     Initial Impression / Assessment and Plan / ED Course  I have reviewed the triage vital signs and the nursing notes.  Pertinent labs & imaging results that were available during my care of the patient were reviewed by me and considered in my medical decision making (see chart for details).  Clinical Course as of Apr 07 752  Mon Apr 07, 2018  0618 D-dimer, quantitative (not at [facility]) [ES]    Clinical Course User Index [ES] Glyn Ade, PA-C   Patient presents to the emergency department for evaluation of right lower rib pain and right upper quadrant pain with associated shortness of breath.  This began yesterday evening after eating sushi, although she states that she has had intermittent pain in this area for several years now and associates this with her endometriosis.  No fever/chills, nausea/vomiting.  On exam she appears to be in pain.  She is afebrile and vital signs stable.  She is acutely tender to palpation over  right lower rib cage as well as right upper quadrant.  No guarding or rigidity.  Labs reviewed.  CBC reveals mild leukocytosis with WBC count 12.4.  CMP without any major electrolyte abnormalities, liver enzymes within normal limits and normal creatinine.  Lipase negative.  Beta hCG negative.  UA without signs of infection.  Chest x-ray with bilateral atelectasis at the bases.  Her pain is atypical given it is reproducible over the rib cage and over the right upper quadrant.  Plan to get right upper quadrant ultrasound for  further evaluation.  Will also get d-dimer given she reports shortness of breath, pleuritic pain and was tachycardic on presentation.  Do not suspect ACS given presentation, negative delta troponin and EKG without acute ischemic changes.  Right upper quadrant ultrasound without acute abnormality, no cholecystitis or cholelithiasis.  D-dimer positive (2.35.)  Discussed this patient with Dr. Stark Jock who also saw the patient and agrees with plan to get CT angio chest to evaluate for PE as well as CT abd/pelvis given acute abdominal tenderness.  Patient informed.  She states that she has an allergy to iodine, reporting sneezing several times in the past with this.  No report of throat closing, hives or anaphylaxis.  Will give her Benadryl and plan for CT scan.  Signout given at shift change to Dr. Massie Maroon who will follow up on CT scans. If negative, she can be discharged with instructions to follow up with her OB/GYN for continued management of her endometriosis.   Final Clinical Impressions(s) / ED Diagnoses   Final diagnoses:  RUQ pain    ED Discharge Orders    None       Bernarda Caffey 04/07/18 0804    Carmin Muskrat, MD 04/07/18 1209

## 2018-04-07 NOTE — ED Notes (Signed)
The pt has had multiple areas of pain for  2 weeks fatigue abd pain back pain all over her body  She saw an ob-gyn doctor who gave her hormones

## 2018-04-07 NOTE — Progress Notes (Signed)
[facility] for heparin  Indication: pulmonary embolus  No Known Allergies  Patient Measurements: Height: 5' 4"  (162.6 cm) Weight: 140 lb (63.5 kg) IBW/kg (Calculated) : 54.7 Heparin Dosing Weight: 63.5 kg  Vital Signs: Temp: 98.5 F (36.9 C) (05/13 1610) Temp Source: Oral (05/13 1610) BP: 109/67 (05/13 1610) Pulse Rate: 83 (05/13 1610)  Labs: Recent Labs    04/06/18 2237 04/07/18 1514  HGB 14.4  --   HCT 41.9  --   PLT 184  --   HEPARINUNFRC  --  0.28*  CREATININE 0.76  --     Estimated Creatinine Clearance: 79.9 mL/min (by C-G formula based on SCr of 0.76 mg/dL).    Assessment: 42 yof presenting with abdominal pain and SOB. No known hx of VTE, no anticoag PTA. Hgb 14.4, plts 184, D-dimer 2.35. CTA showing occlusive PE in R lower lobe pulmonary arteries.   Initial heparin level = 0.28  Goal of Therapy:  Heparin level 0.3-0.7 units/ml Monitor platelets by anticoagulation protocol: Yes   Plan:  Increase heparin to 1200 units / hr Recheck heparin level in 6 hours  Thank you Anette Guarneri, PharmD [phone] 04/07/2018,4:29 PM

## 2018-04-08 DIAGNOSIS — I2699 Other pulmonary embolism without acute cor pulmonale: Secondary | ICD-10-CM | POA: Diagnosis present

## 2018-04-08 LAB — BASIC METABOLIC PANEL
ANION GAP: 9 (ref 5–15)
BUN: 6 mg/dL (ref 6–20)
CO2: 22 mmol/L (ref 22–32)
Calcium: 8.9 mg/dL (ref 8.9–10.3)
Chloride: 107 mmol/L (ref 101–111)
Creatinine, Ser: 0.74 mg/dL (ref 0.44–1.00)
GFR calc Af Amer: >60 mL/min (ref >60)
Glucose, Bld: 117 mg/dL — ABNORMAL HIGH (ref 65–99)
POTASSIUM: 3.6 mmol/L (ref 3.5–5.1)
Sodium: 138 mmol/L (ref 135–145)

## 2018-04-08 LAB — CBC
HCT: 40.6 % (ref 36.0–46.0)
Hemoglobin: 13.5 g/dL (ref 12.0–15.0)
MCH: 31.6 pg (ref 26.0–34.0)
MCHC: 33.3 g/dL (ref 30.0–36.0)
MCV: 95.1 fL (ref 78.0–100.0)
PLATELETS: 173 10*3/uL (ref 150–400)
RBC: 4.27 MIL/uL (ref 3.87–5.11)
RDW: 13 % (ref 11.5–15.5)
WBC: 13.1 10*3/uL — AB (ref 4.0–10.5)

## 2018-04-08 LAB — PROTEIN S, TOTAL: PROTEIN S AG TOTAL: 82 % (ref 60–150)

## 2018-04-08 LAB — HEPARIN LEVEL (UNFRACTIONATED)
Heparin Unfractionated: 0.44 IU/mL (ref 0.30–0.70)
Heparin Unfractionated: 0.49 IU/mL (ref 0.30–0.70)

## 2018-04-08 LAB — HOMOCYSTEINE: HOMOCYSTEINE-NORM: 12.1 umol/L (ref 0.0–15.0)

## 2018-04-08 LAB — PROTEIN C ACTIVITY: Protein C Activity: 93 % (ref 73–180)

## 2018-04-08 LAB — HIV ANTIBODY (ROUTINE TESTING W REFLEX): HIV Screen 4th Generation wRfx: NONREACTIVE

## 2018-04-08 LAB — PROTEIN S ACTIVITY: Protein S Activity: 86 % (ref 63–140)

## 2018-04-08 MED ORDER — APIXABAN 5 MG PO TABS: 5.0000 mg | ORAL_TABLET | Freq: Two times a day (BID) | ORAL | Status: DC

## 2018-04-08 MED ORDER — APIXABAN 5 MG PO TABS
10.0000 mg | ORAL_TABLET | Freq: Two times a day (BID) | ORAL | Status: DC
  Administered 2018-04-08 – 2018-04-09 (×3): 10 mg via ORAL
  Filled 2018-04-08 (×3): qty 2

## 2018-04-08 NOTE — Plan of Care (Signed)
Discussed plan of care with patient.  Patient has concerns about taking morphine for pain.  This nurse explained the amount prescribed is very minimal.  Patient preferred tylenol for pain and discomfort.  Tylenol seems to work for the patient.  Patient displayed some teach-back.

## 2018-04-08 NOTE — Progress Notes (Addendum)
NCM will give patient the 30 day free card and the 10 co pay card for eliquis which will still work even if deductible has not been met  Per eliquis rep.  6.  S/W ASHLEY @ [place] # [phone]    5. XARELTO 15 MG BID  COVER- YES  CO-PAY- $ 884.88  PRIOR APPROVAL-NO   2.XARELTO 20 MG DAILY  COVER-YES  CO-PAY-$442.72  PRIOR APPROVAL    3 .ELIQUIS 2.5 MG BID  COVER- YES  CO-PAY- $ 438.94  PRIOR APPROVAL- NO    4. ELIQUIS 5 MG BID  COVER- YES  CO-PAY- $ 438.94  PRIOR APPROVAL- NO   DEDUCTIBLE - NOT MET $ 3,000.00   PREFERRED PHARMACY : YES- [facility]  MORE THEN A 30 DAY SUPPLY WILL NEED TO USE A 90 DAY SUPPLY    TO A 90 DAY SUPPLY AT RETAIL

## 2018-04-08 NOTE — Progress Notes (Addendum)
PROGRESS NOTE    Sierra Olsen  GGY:[id number] DOB: February 10, 1976 DOA: 04/06/2018 PCP: Marda Stalker, PA-C   Brief Narrative:  Patient is a 42 year old female with past medical history significant for back pain, celiac disease, endometriosis with a cystocele, asthma who presented to the emergency department with abdominal pain but mainly on right upper quadrant .  Patient was also complaining of shortness of breath she has history of endometriosis and.  Has been started on progesterone  complaints to hysterectomy.  CT imagings were positive for pulmonary emboli on the right pulmonary arteries.   Assessment & Plan:   Principal Problem:   Pulmonary embolism (HCC) Active Problems:   Endometriosis   PE (pulmonary thromboembolism) ([place])  Pulmonary embolism: CT imaging showed occlusive pulmonary emboli in the right lower lobe pulmonary arteries. Also showed deep venous thrombosis within the left common iliac vein extending into the left internal iliac vein. Started on IV heparin  Yesterday on admission .She has been started on Eliquis now .  She has no apparent risk factors other than progesterone therapy. She states her mother had history of clots.  Hypercoagulable profile have been sent which might time to come back.  Patient is hemodynamically stable today but feels uncomfortable to go home due to persistent pain on the right lower chest and right upper quadrant. Imagings reviewed, there is no evidence of gallbladder disease that could explain right upper quadrant pain so it most likely froms right lower lobe pulmonary emboli.  Endometriosis: Follows with GYN.  She was started on progesterone with with a future plan for hysterectomy.  She will follow-up with GYN as an outpatient. She follows with Dr. Clide Dales at [facility].  Leucocytosis: Most likely reactive.  We will continue to monitor.  Celiac disease: We will continue gluten diet.   DVT prophylaxis: Eliquis Code Status: Full Family  Communication: None present at the bedside Disposition Plan: Home tomorrow   Consultants: None  Procedures: None  Antimicrobials: None  Subjective: Patient seen and examined the bedside this morning.  She was complaining of right upper quadrant pain  or right lower chest.  No tenderness on examination.  She wants to stay 1 more day and feels she is not ready to go home today.  Objective: Vitals:   04/07/18 1610 04/07/18 2329 04/08/18 0806 04/08/18 0823  BP: 109/67 112/67  115/65  Pulse: 83 85 94 85  Resp: 20 18 16    Temp: 98.5 F (36.9 C) 98.5 F (36.9 C)  99.3 F (37.4 C)  TempSrc: Oral Oral  Oral  SpO2: 97% 96% 95%   Weight:      Height:        Intake/Output Summary (Last 24 hours) at 04/08/2018 1426 Last data filed at 04/08/2018 0802 Gross per 24 hour  Intake 732.93 ml  Output -  Net 732.93 ml   Filed Weights   04/06/18 2227  Weight: 63.5 kg (140 lb)    Examination:  General exam: Appears calm and comfortable ,Not in distress,average built HEENT:PERRL,Oral mucosa moist, Ear/Nose normal on gross exam Respiratory system: Bilateral equal air entry, normal vesicular breath sounds, no wheezes or crackles  Cardiovascular system: S1 & S2 heard, RRR. No JVD, murmurs, rubs, gallops or clicks. No pedal edema. Gastrointestinal system: Abdomen is nondistended, soft and nontender. No organomegaly or masses felt. Normal bowel sounds heard. Central nervous system: Alert and oriented. No focal neurological deficits. Extremities: No edema, no clubbing ,no cyanosis, distal peripheral pulses palpable. Skin: No rashes, lesions or ulcers,no icterus ,no  pallor MSK: Normal muscle bulk,tone ,power Psychiatry: Judgement and insight appear normal. Mood & affect appropriate.     Data Reviewed: I have personally reviewed following labs and imaging studies  CBC: Recent Labs  Lab 04/06/18 2237 04/08/18 0255  WBC 12.4* 13.1*  HGB 14.4 13.5  HCT 41.9 40.6  MCV 93.9 95.1  PLT 184  825   Basic Metabolic Panel: Recent Labs  Lab 04/06/18 2237 04/08/18 0255  NA 137 138  K 3.7 3.6  CL 105 107  CO2 21* 22  GLUCOSE 104* 117*  BUN 12 6  CREATININE 0.76 0.74  CALCIUM 8.7* 8.9   GFR: Estimated Creatinine Clearance: 79.9 mL/min (by C-G formula based on SCr of 0.74 mg/dL). Liver Function Tests: Recent Labs  Lab 04/06/18 2237  AST 13*  ALT 15  ALKPHOS 57  BILITOT 0.9  PROT 7.2  ALBUMIN 3.6   Recent Labs  Lab 04/06/18 2237  LIPASE 28   No results for input(s): AMMONIA in the last 168 hours. Coagulation Profile: No results for input(s): INR, PROTIME in the last 168 hours. Cardiac Enzymes: No results for input(s): CKTOTAL, CKMB, CKMBINDEX, TROPONINI in the last 168 hours. BNP (last 3 results) No results for input(s): PROBNP in the last 8760 hours. HbA1C: No results for input(s): HGBA1C in the last 72 hours. CBG: No results for input(s): GLUCAP in the last 168 hours. Lipid Profile: No results for input(s): CHOL, HDL, LDLCALC, TRIG, CHOLHDL, LDLDIRECT in the last 72 hours. Thyroid Function Tests: No results for input(s): TSH, T4TOTAL, FREET4, T3FREE, THYROIDAB in the last 72 hours. Anemia Panel: No results for input(s): VITAMINB12, FOLATE, FERRITIN, TIBC, IRON, RETICCTPCT in the last 72 hours. Sepsis Labs: No results for input(s): PROCALCITON, LATICACIDVEN in the last 168 hours.  No results found for this or any previous visit (from the past 240 hour(s)).       Radiology Studies: Dg Chest 2 View  Result Date: 04/07/2018 CLINICAL DATA:  Acute onset of right upper quadrant abdominal pain and generalized chest pain. Shortness of breath. EXAM: CHEST - 2 VIEW COMPARISON:  Chest radiograph performed 04/09/2017 FINDINGS: The lungs are well-aerated. Minimal bibasilar opacities could reflect atelectasis or mild interstitial edema. There is no evidence of pleural effusion or pneumothorax. The heart is normal in size; the mediastinal contour is within normal  limits. No acute osseous abnormalities are seen. IMPRESSION: Minimal bibasilar opacities could reflect atelectasis or mild interstitial edema. Electronically Signed   By: Garald Balding M.D.   On: 04/07/2018 04:32   Ct Angio Chest Pe W And/or Wo Contrast  Result Date: 04/07/2018 CLINICAL DATA:  Epigastric pain radiating to right chest and shoulders. Shortness of breath. EXAM: CT ANGIOGRAPHY CHEST CT ABDOMEN AND PELVIS WITH CONTRAST TECHNIQUE: Multidetector CT imaging of the chest was performed using the standard protocol during bolus administration of intravenous contrast. Multiplanar CT image reconstructions and MIPs were obtained to evaluate the vascular anatomy. Multidetector CT imaging of the abdomen and pelvis was performed using the standard protocol during bolus administration of intravenous contrast. CONTRAST:  165m ISOVUE-370 IOPAMIDOL (ISOVUE-370) INJECTION 76% COMPARISON:  None. FINDINGS: CTA CHEST FINDINGS Cardiovascular: Occlusive emboli noted within the right lower lobe pulmonary arteries. No additional pulmonary emboli noted. No evidence of right heart strain. Heart is normal size. Aorta is normal caliber. Mediastinum/Nodes: No mediastinal, hilar, or axillary adenopathy. Lungs/Pleura: Dependent atelectasis in the lower lobes. No effusions. Musculoskeletal: No acute bony abnormality. Review of the MIP images confirms the above findings. CT ABDOMEN and PELVIS  FINDINGS Hepatobiliary: Scattered hepatic cysts appear benign. Gallbladder unremarkable. No biliary duct dilatation. Pancreas: No focal abnormality or ductal dilatation. Spleen: No focal abnormality.  Normal size. Adrenals/Urinary Tract: No adrenal abnormality. No focal renal abnormality. No stones or hydronephrosis. Urinary bladder is unremarkable. Stomach/Bowel: Stomach, large and small bowel grossly unremarkable. Appendix not definitively seen. No inflammatory process in the right lower quadrant. Vascular/Lymphatic: Filling defect noted  within the left common iliac vein and extending into the left internal iliac vein compatible with deep venous thrombosis. No aneurysm or adenopathy. Reproductive: Uterus and adnexa unremarkable.  No mass. Other: No free fluid or free air. Musculoskeletal: No acute bony abnormality. Review of the MIP images confirms the above findings. IMPRESSION: Occlusive pulmonary emboli in the right lower lobe pulmonary arteries. Dependent atelectasis in the lower lobes. Deep venous thrombosis within the left common iliac vein extending into the left internal iliac vein. These results were called by telephone at the time of interpretation on 04/07/2018 at 10:23 am to Dr. Massie Maroon , who verbally acknowledged these results. Electronically Signed   By: Rolm Baptise M.D.   On: 04/07/2018 10:26   Ct Abdomen Pelvis W Contrast  Result Date: 04/07/2018 CLINICAL DATA:  Epigastric pain radiating to right chest and shoulders. Shortness of breath. EXAM: CT ANGIOGRAPHY CHEST CT ABDOMEN AND PELVIS WITH CONTRAST TECHNIQUE: Multidetector CT imaging of the chest was performed using the standard protocol during bolus administration of intravenous contrast. Multiplanar CT image reconstructions and MIPs were obtained to evaluate the vascular anatomy. Multidetector CT imaging of the abdomen and pelvis was performed using the standard protocol during bolus administration of intravenous contrast. CONTRAST:  145m ISOVUE-370 IOPAMIDOL (ISOVUE-370) INJECTION 76% COMPARISON:  None. FINDINGS: CTA CHEST FINDINGS Cardiovascular: Occlusive emboli noted within the right lower lobe pulmonary arteries. No additional pulmonary emboli noted. No evidence of right heart strain. Heart is normal size. Aorta is normal caliber. Mediastinum/Nodes: No mediastinal, hilar, or axillary adenopathy. Lungs/Pleura: Dependent atelectasis in the lower lobes. No effusions. Musculoskeletal: No acute bony abnormality. Review of the MIP images confirms the above findings. CT ABDOMEN  and PELVIS FINDINGS Hepatobiliary: Scattered hepatic cysts appear benign. Gallbladder unremarkable. No biliary duct dilatation. Pancreas: No focal abnormality or ductal dilatation. Spleen: No focal abnormality.  Normal size. Adrenals/Urinary Tract: No adrenal abnormality. No focal renal abnormality. No stones or hydronephrosis. Urinary bladder is unremarkable. Stomach/Bowel: Stomach, large and small bowel grossly unremarkable. Appendix not definitively seen. No inflammatory process in the right lower quadrant. Vascular/Lymphatic: Filling defect noted within the left common iliac vein and extending into the left internal iliac vein compatible with deep venous thrombosis. No aneurysm or adenopathy. Reproductive: Uterus and adnexa unremarkable.  No mass. Other: No free fluid or free air. Musculoskeletal: No acute bony abnormality. Review of the MIP images confirms the above findings. IMPRESSION: Occlusive pulmonary emboli in the right lower lobe pulmonary arteries. Dependent atelectasis in the lower lobes. Deep venous thrombosis within the left common iliac vein extending into the left internal iliac vein. These results were called by telephone at the time of interpretation on 04/07/2018 at 10:23 am to Dr. MMassie Maroon, who verbally acknowledged these results. Electronically Signed   By: KRolm BaptiseM.D.   On: 04/07/2018 10:26   [place]Abdomen Limited Ruq  Result Date: 04/07/2018 CLINICAL DATA:  RIGHT upper quadrant and back pain tonight. History of celiac disease. EXAM: ULTRASOUND ABDOMEN LIMITED RIGHT UPPER QUADRANT COMPARISON:  Abdominal ultrasound February 17, 2016 FINDINGS: Gallbladder: No gallstones or wall thickening visualized. No sonographic  Murphy sign noted by sonographer. Common bile duct: Diameter: 3 mm Liver: No focal lesion identified. Within normal limits in parenchymal echogenicity. Portal vein is patent on color Doppler imaging with normal direction of blood flow towards the liver. IMPRESSION: Negative RIGHT  upper quadrant ultrasound. Electronically Signed   By: Elon Alas M.D.   On: 04/07/2018 06:11        Scheduled Meds: . apixaban  10 mg Oral BID  . [START ON 04/15/2018] apixaban  5 mg Oral BID  . docusate sodium  100 mg Oral BID  . mometasone-formoterol  2 puff Inhalation BID  . sodium chloride flush  3 mL Intravenous Q12H   Continuous Infusions: . lactated ringers 50 mL/hr (04/08/18 1103)     LOS: 0 days    Time spent:35 mins. More than 50% of that time was spent in counseling and/or coordination of care.      Shelly Coss, MD [facility] Pager [phone]  If 7PM-7AM, please contact night-coverage [email or web address] Password [email or web address] 04/08/2018, 2:26 PM

## 2018-04-08 NOTE — Progress Notes (Signed)
ANTICOAGULATION CONSULT NOTE  Pharmacy Consult for heparin  Indication: pulmonary embolus  No Known Allergies  Patient Measurements: Height: 5' 4"  (162.6 cm) Weight: 140 lb (63.5 kg) IBW/kg (Calculated) : 54.7 Heparin Dosing Weight: 63.5 kg  Vital Signs: Temp: 98.5 F (36.9 C) (05/13 2329) Temp Source: Oral (05/13 2329) BP: 112/67 (05/13 2329) Pulse Rate: 85 (05/13 2329)  Labs: Recent Labs    04/06/18 2237 04/07/18 1514 04/07/18 2330  HGB 14.4  --   --   HCT 41.9  --   --   PLT 184  --   --   HEPARINUNFRC  --  0.28* 0.49  CREATININE 0.76  --   --     Estimated Creatinine Clearance: 79.9 mL/min (by C-G formula based on SCr of 0.76 mg/dL).  Assessment: 42 y.o. female with PE for heparin Goal of Therapy:  Heparin level 0.3-0.7 units/ml Monitor platelets by anticoagulation protocol: Yes   Plan:  Continue Heparin at current rate  Phillis Knack, PharmD, BCPS  04/08/2018,12:13 AM

## 2018-04-09 DIAGNOSIS — I2699 Other pulmonary embolism without acute cor pulmonale: Secondary | ICD-10-CM | POA: Diagnosis not present

## 2018-04-09 LAB — CBC WITH DIFFERENTIAL/PLATELET
ABS IMMATURE GRANULOCYTES: 0.1 10*3/uL (ref 0.0–0.1)
BASOS PCT: 0 %
Basophils Absolute: 0 10*3/uL (ref 0.0–0.1)
Eosinophils Absolute: 0.1 10*3/uL (ref 0.0–0.7)
Eosinophils Relative: 1 %
HEMATOCRIT: 39.4 % (ref 36.0–46.0)
HEMOGLOBIN: 13.1 g/dL (ref 12.0–15.0)
Immature Granulocytes: 1 %
LYMPHS ABS: 2.1 10*3/uL (ref 0.7–4.0)
LYMPHS PCT: 15 %
MCH: 31.6 pg (ref 26.0–34.0)
MCHC: 33.2 g/dL (ref 30.0–36.0)
MCV: 94.9 fL (ref 78.0–100.0)
MONO ABS: 1.7 10*3/uL — AB (ref 0.1–1.0)
MONOS PCT: 12 %
NEUTROS ABS: 10.1 10*3/uL — AB (ref 1.7–7.7)
Neutrophils Relative %: 71 %
Platelets: 171 10*3/uL (ref 150–400)
RBC: 4.15 MIL/uL (ref 3.87–5.11)
RDW: 12.4 % (ref 11.5–15.5)
WBC: 14.2 10*3/uL — ABNORMAL HIGH (ref 4.0–10.5)

## 2018-04-09 LAB — HEXAGONAL PHASE PHOSPHOLIPID: Hexagonal Phase Phospholipid: 5 s (ref 0–11)

## 2018-04-09 LAB — LUPUS ANTICOAGULANT PANEL
DRVVT: 40.7 s (ref 0.0–47.0)
PTT Lupus Anticoagulant: 76.8 s — ABNORMAL HIGH (ref 0.0–51.9)

## 2018-04-09 LAB — PTT-LA MIX: PTT-LA Mix: 70.6 s — ABNORMAL HIGH (ref 0.0–48.9)

## 2018-04-09 MED ORDER — APIXABAN 5 MG PO TABS: 5.0000 mg | ORAL_TABLET | Freq: Two times a day (BID) | ORAL | 0 refills | Status: DC

## 2018-04-09 MED ORDER — APIXABAN 5 MG PO TABS: 10.0000 mg | ORAL_TABLET | Freq: Two times a day (BID) | ORAL | 0 refills | Status: DC

## 2018-04-09 NOTE — Discharge Summary (Signed)
Physician Discharge Summary  Sierra Olsen YKD:[id number] DOB: 04/25/1976 DOA: 04/06/2018  PCP: Marda Stalker, PA-C  Admit date: 04/06/2018 Discharge date: 04/09/2018  Admitted From: Home Disposition:  Home  Discharge Condition:Stable CODE STATUS:FULL Diet recommendation: Heart Healthy   Brief/Interim Summary:  Patient is a 42 year old female with past medical history significant for back pain, celiac disease, endometriosis with a cystocele, asthma who presented to the emergency department with abdominal pain but mainly on right upper quadrant .  Patient was also complaining of shortness of breath.She  has history of endometriosis and has been started on progesterone  with plans for  hysterectomy.  CT imagings were positive for pulmonary emboli on the right pulmonary arteries.  Patient was admitted and started on IV heparin. Currently her respiratory status is stable.  She is hemodynamically stable.  Heparin drip has been stopped and she has been started on Eliquis.  She is stable for discharge to home today.    Following problems were addressed during her hospitalization:  Pulmonary embolism: CT imaging showed occlusive pulmonary emboli in the right lower lobe pulmonary arteries. Also showed deep venous thrombosis within the left common iliac vein extending into the left internal iliac vein. Started on IV heparin   on admission .She has been started on Eliquis now . She has no apparent risk factors other than progesterone therapy. She states her mother had history of clots.  Hypercoagulable profile have been sent which might time to come back.  Patient is hemodynamically stable today.She was complaining of right upper quadrant yesterday which is better today. Imagings reviewed, there is no evidence of gallbladder disease that could explain right upper quadrant pain so it most likely froms right lower lobe pulmonary emboli.  Endometriosis: Follows with GYN.  She was started on  progesterone with with a future plan for hysterectomy.  She will follow-up with GYN as an outpatient. She follows with Dr. Clide Dales at [facility].  Leucocytosis: Most likely reactive.  Follow up CBC in a week.  Celiac disease: We will continue gluten diet.    Discharge Diagnoses:  Principal Problem:   Pulmonary embolism ([place]) Active Problems:   Endometriosis   PE (pulmonary thromboembolism) ([place])    Discharge Instructions  Discharge Instructions    Diet - low sodium heart healthy   Complete by:  As directed    Discharge instructions   Complete by:  As directed    1) Please take prescribed medications as instructed. 2) Follow up with your PCP in a week.Do a CBC test to check your white cell counts in a week. 3) Follow up with your Gynecologist in the next appointment.   Increase activity slowly   Complete by:  As directed      Allergies as of 04/09/2018   No Known Allergies     Medication List    TAKE these medications   AEROCHAMBER MV inhaler Use as instructed   albuterol 108 (90 Base) MCG/ACT inhaler Commonly known as:  PROAIR HFA 2 puffs every 6 hours as needed What changed:    how much to take  how to take this  when to take this  reasons to take this  additional instructions   ALPRAZolam 0.25 MG tablet Commonly known as:  XANAX Take 0.125 mg by mouth 2 (two) times daily as needed for anxiety. Take 1/2 tablet BID prn   apixaban 5 MG Tabs tablet Commonly known as:  ELIQUIS Take 2 tablets (10 mg total) by mouth 2 (two) times daily for 6 days.  apixaban 5 MG Tabs tablet Commonly known as:  ELIQUIS Take 1 tablet (5 mg total) by mouth 2 (two) times daily. Start taking on:  04/15/2018   budesonide-formoterol 160-4.5 MCG/ACT inhaler Commonly known as:  SYMBICORT Inhale 2 puffs then rinse mouth, twice daily   mometasone 50 MCG/ACT nasal spray Commonly known as:  NASONEX Place 2 sprays into the nose daily as needed (allergies).      Follow-up  Information    Marda Stalker, PA-C. Schedule an appointment as soon as possible for a visit in 1 week(s).   Specialty:  Family Medicine Contact information: [place] [address] [phone]          No Known Allergies  Consultations: None  Procedures/Studies: Dg Chest 2 View  Result Date: 04/07/2018 CLINICAL DATA:  Acute onset of right upper quadrant abdominal pain and generalized chest pain. Shortness of breath. EXAM: CHEST - 2 VIEW COMPARISON:  Chest radiograph performed 04/09/2017 FINDINGS: The lungs are well-aerated. Minimal bibasilar opacities could reflect atelectasis or mild interstitial edema. There is no evidence of pleural effusion or pneumothorax. The heart is normal in size; the mediastinal contour is within normal limits. No acute osseous abnormalities are seen. IMPRESSION: Minimal bibasilar opacities could reflect atelectasis or mild interstitial edema. Electronically Signed   By: Garald Balding M.D.   On: 04/07/2018 04:32   Ct Angio Chest Pe W And/or Wo Contrast  Result Date: 04/07/2018 CLINICAL DATA:  Epigastric pain radiating to right chest and shoulders. Shortness of breath. EXAM: CT ANGIOGRAPHY CHEST CT ABDOMEN AND PELVIS WITH CONTRAST TECHNIQUE: Multidetector CT imaging of the chest was performed using the standard protocol during bolus administration of intravenous contrast. Multiplanar CT image reconstructions and MIPs were obtained to evaluate the vascular anatomy. Multidetector CT imaging of the abdomen and pelvis was performed using the standard protocol during bolus administration of intravenous contrast. CONTRAST:  120m ISOVUE-370 IOPAMIDOL (ISOVUE-370) INJECTION 76% COMPARISON:  None. FINDINGS: CTA CHEST FINDINGS Cardiovascular: Occlusive emboli noted within the right lower lobe pulmonary arteries. No additional pulmonary emboli noted. No evidence of right heart strain. Heart is normal size. Aorta is normal caliber. Mediastinum/Nodes: No  mediastinal, hilar, or axillary adenopathy. Lungs/Pleura: Dependent atelectasis in the lower lobes. No effusions. Musculoskeletal: No acute bony abnormality. Review of the MIP images confirms the above findings. CT ABDOMEN and PELVIS FINDINGS Hepatobiliary: Scattered hepatic cysts appear benign. Gallbladder unremarkable. No biliary duct dilatation. Pancreas: No focal abnormality or ductal dilatation. Spleen: No focal abnormality.  Normal size. Adrenals/Urinary Tract: No adrenal abnormality. No focal renal abnormality. No stones or hydronephrosis. Urinary bladder is unremarkable. Stomach/Bowel: Stomach, large and small bowel grossly unremarkable. Appendix not definitively seen. No inflammatory process in the right lower quadrant. Vascular/Lymphatic: Filling defect noted within the left common iliac vein and extending into the left internal iliac vein compatible with deep venous thrombosis. No aneurysm or adenopathy. Reproductive: Uterus and adnexa unremarkable.  No mass. Other: No free fluid or free air. Musculoskeletal: No acute bony abnormality. Review of the MIP images confirms the above findings. IMPRESSION: Occlusive pulmonary emboli in the right lower lobe pulmonary arteries. Dependent atelectasis in the lower lobes. Deep venous thrombosis within the left common iliac vein extending into the left internal iliac vein. These results were called by telephone at the time of interpretation on 04/07/2018 at 10:23 am to Dr. MMassie Maroon, who verbally acknowledged these results. Electronically Signed   By: KRolm BaptiseM.D.   On: 04/07/2018 10:26   Ct Abdomen Pelvis  W Contrast  Result Date: 04/07/2018 CLINICAL DATA:  Epigastric pain radiating to right chest and shoulders. Shortness of breath. EXAM: CT ANGIOGRAPHY CHEST CT ABDOMEN AND PELVIS WITH CONTRAST TECHNIQUE: Multidetector CT imaging of the chest was performed using the standard protocol during bolus administration of intravenous contrast. Multiplanar CT image  reconstructions and MIPs were obtained to evaluate the vascular anatomy. Multidetector CT imaging of the abdomen and pelvis was performed using the standard protocol during bolus administration of intravenous contrast. CONTRAST:  188m ISOVUE-370 IOPAMIDOL (ISOVUE-370) INJECTION 76% COMPARISON:  None. FINDINGS: CTA CHEST FINDINGS Cardiovascular: Occlusive emboli noted within the right lower lobe pulmonary arteries. No additional pulmonary emboli noted. No evidence of right heart strain. Heart is normal size. Aorta is normal caliber. Mediastinum/Nodes: No mediastinal, hilar, or axillary adenopathy. Lungs/Pleura: Dependent atelectasis in the lower lobes. No effusions. Musculoskeletal: No acute bony abnormality. Review of the MIP images confirms the above findings. CT ABDOMEN and PELVIS FINDINGS Hepatobiliary: Scattered hepatic cysts appear benign. Gallbladder unremarkable. No biliary duct dilatation. Pancreas: No focal abnormality or ductal dilatation. Spleen: No focal abnormality.  Normal size. Adrenals/Urinary Tract: No adrenal abnormality. No focal renal abnormality. No stones or hydronephrosis. Urinary bladder is unremarkable. Stomach/Bowel: Stomach, large and small bowel grossly unremarkable. Appendix not definitively seen. No inflammatory process in the right lower quadrant. Vascular/Lymphatic: Filling defect noted within the left common iliac vein and extending into the left internal iliac vein compatible with deep venous thrombosis. No aneurysm or adenopathy. Reproductive: Uterus and adnexa unremarkable.  No mass. Other: No free fluid or free air. Musculoskeletal: No acute bony abnormality. Review of the MIP images confirms the above findings. IMPRESSION: Occlusive pulmonary emboli in the right lower lobe pulmonary arteries. Dependent atelectasis in the lower lobes. Deep venous thrombosis within the left common iliac vein extending into the left internal iliac vein. These results were called by telephone at  the time of interpretation on 04/07/2018 at 10:23 am to Dr. MMassie Maroon, who verbally acknowledged these results. Electronically Signed   By: KRolm BaptiseM.D.   On: 04/07/2018 10:26   UKoreaAbdomen Limited Ruq  Result Date: 04/07/2018 CLINICAL DATA:  RIGHT upper quadrant and back pain tonight. History of celiac disease. EXAM: ULTRASOUND ABDOMEN LIMITED RIGHT UPPER QUADRANT COMPARISON:  Abdominal ultrasound February 17, 2016 FINDINGS: Gallbladder: No gallstones or wall thickening visualized. No sonographic Murphy sign noted by sonographer. Common bile duct: Diameter: 3 mm Liver: No focal lesion identified. Within normal limits in parenchymal echogenicity. Portal vein is patent on color Doppler imaging with normal direction of blood flow towards the liver. IMPRESSION: Negative RIGHT upper quadrant ultrasound. Electronically Signed   By: CElon AlasM.D.   On: 04/07/2018 06:11      Subjective: Patient seen and examined the bedside this morning.  Remains comfortable.  Right upper quadrant pain has improved.   stable for discharge to home today.  Discharge Exam: Vitals:   04/09/18 0739 04/09/18 0816  BP:  127/69  Pulse:  97  Resp:  18  Temp:  98 F (36.7 C)  SpO2: 96% 95%   Vitals:   04/08/18 2012 04/09/18 0013 04/09/18 0739 04/09/18 0816  BP:  (!) 111/58  127/69  Pulse:  81  97  Resp:    18  Temp:  98.2 F (36.8 C)  98 F (36.7 C)  TempSrc:  Oral    SpO2: 95% 96% 96% 95%  Weight:      Height:        General: Pt  is alert, awake, not in acute distress Cardiovascular: RRR, S1/S2 +, no rubs, no gallops Respiratory: CTA bilaterally, no wheezing, no rhonchi Abdominal: Soft, NT, ND, bowel sounds + Extremities: no edema, no cyanosis    The results of significant diagnostics from this hospitalization (including imaging, microbiology, ancillary and laboratory) are listed below for reference.     Microbiology: No results found for this or any previous visit (from the past 240 hour(s)).    Labs: BNP (last 3 results) No results for input(s): BNP in the last 8760 hours. Basic Metabolic Panel: Recent Labs  Lab 04/06/18 2237 04/08/18 0255  NA 137 138  K 3.7 3.6  CL 105 107  CO2 21* 22  GLUCOSE 104* 117*  BUN 12 6  CREATININE 0.76 0.74  CALCIUM 8.7* 8.9   Liver Function Tests: Recent Labs  Lab 04/06/18 2237  AST 13*  ALT 15  ALKPHOS 57  BILITOT 0.9  PROT 7.2  ALBUMIN 3.6   Recent Labs  Lab 04/06/18 2237  LIPASE 28   No results for input(s): AMMONIA in the last 168 hours. CBC: Recent Labs  Lab 04/06/18 2237 04/08/18 0255 04/09/18 0155  WBC 12.4* 13.1* 14.2*  NEUTROABS  --   --  10.1*  HGB 14.4 13.5 13.1  HCT 41.9 40.6 39.4  MCV 93.9 95.1 94.9  PLT 184 173 171   Cardiac Enzymes: No results for input(s): CKTOTAL, CKMB, CKMBINDEX, TROPONINI in the last 168 hours. BNP: Invalid input(s): POCBNP CBG: No results for input(s): GLUCAP in the last 168 hours. D-Dimer Recent Labs    04/07/18 0615  DDIMER 2.35*   Hgb A1c No results for input(s): HGBA1C in the last 72 hours. Lipid Profile No results for input(s): CHOL, HDL, LDLCALC, TRIG, CHOLHDL, LDLDIRECT in the last 72 hours. Thyroid function studies No results for input(s): TSH, T4TOTAL, T3FREE, THYROIDAB in the last 72 hours.  Invalid input(s): FREET3 Anemia work up No results for input(s): VITAMINB12, FOLATE, FERRITIN, TIBC, IRON, RETICCTPCT in the last 72 hours. Urinalysis    Component Value Date/Time   COLORURINE YELLOW 04/06/2018 2236   APPEARANCEUR CLEAR 04/06/2018 2236   LABSPEC 1.021 04/06/2018 2236   PHURINE 8.0 04/06/2018 2236   GLUCOSEU NEGATIVE 04/06/2018 2236   HGBUR NEGATIVE 04/06/2018 2236   BILIRUBINUR NEGATIVE 04/06/2018 2236   KETONESUR NEGATIVE 04/06/2018 2236   PROTEINUR NEGATIVE 04/06/2018 2236   NITRITE NEGATIVE 04/06/2018 2236   LEUKOCYTESUR NEGATIVE 04/06/2018 2236   Sepsis Labs Invalid input(s): PROCALCITONIN,  WBC,  LACTICIDVEN Microbiology No results  found for this or any previous visit (from the past 240 hour(s)).  Please note: You were cared for by a hospitalist during your hospital stay. Once you are discharged, your primary care physician will handle any further medical issues. Please note that NO REFILLS for any discharge medications will be authorized once you are discharged, as it is imperative that you return to your primary care physician (or establish a relationship with a primary care physician if you do not have one) for your post hospital discharge needs so that they can reassess your need for medications and monitor your lab values.    Time coordinating discharge: 40 minutes  SIGNED:   Shelly Coss, MD  [facility] 04/09/2018, 10:52 AM Pager [phone]  If 7PM-7AM, please contact night-coverage [email or web address] Password [email or web address]

## 2018-04-09 NOTE — Progress Notes (Signed)
..  Patient given all discharge paper work. Eliquis discount cards included by case management. All pages reviewed and subsequent questions answered. Patients telemetry and peripheral IV's discontinued without complications. Patients belongings returned. Patient placed in wheel chair and escorted by staff member to pick up location.   English as a second language teacher

## 2018-04-10 LAB — BETA-2-GLYCOPROTEIN I ABS, IGG/M/A: Beta-2-Glycoprotein I IgM: <9 GPI IgM units (ref 0–32)

## 2018-04-10 LAB — CARDIOLIPIN ANTIBODIES, IGG, IGM, IGA: Anticardiolipin IgA: <9 APL U/mL (ref 0–11)

## 2018-04-10 LAB — PROTEIN C, TOTAL: Protein C, Total: 86 % (ref 60–150)

## 2018-04-11 LAB — PROTHROMBIN GENE MUTATION

## 2018-04-11 LAB — FACTOR 5 LEIDEN

## 2018-07-07 ENCOUNTER — Encounter: Payer: Self-pay | Admitting: Vascular Surgery

## 2018-07-21 ENCOUNTER — Encounter: Payer: Self-pay | Admitting: Vascular Surgery

## 2018-07-21 ENCOUNTER — Other Ambulatory Visit: Payer: Self-pay

## 2018-07-21 ENCOUNTER — Ambulatory Visit (INDEPENDENT_AMBULATORY_CARE_PROVIDER_SITE_OTHER): Payer: Managed Care, Other (non HMO) | Admitting: Vascular Surgery

## 2018-07-21 VITALS — BP 107/72 | HR 69 | Temp 97.3°F | Resp 14 | Ht 64.0 in | Wt 138.0 lb

## 2018-07-21 DIAGNOSIS — I871 Compression of vein: Secondary | ICD-10-CM

## 2018-07-21 NOTE — Progress Notes (Signed)
Patient ID: Sierra Olsen, female   DOB: [birth year], 42 y.o.   MRN: [id number]  Reason for Consult: New Patient (Initial Visit) (DVT in May)   Referred by Marda Stalker, PA-C  Subjective:     HPI:  Sierra Olsen is a 42 y.o. female with history of endometriosis and in May of this year had PEs and was found to have left common iliac vein DVT that was quite significant.  She did not have any duplex of her left lower extremity performed at that time.  She does not have any swelling of her either lower extremity.  She does not have any previous history of DVT prior to this does not have a strong family history of DVT.  Her risk factors best they could tell were only that she was taking progesterone at that time.  She is now taking Eliquis daily and is planning to take this for 2 years.  She still having some chest pain shortness of breath with walking.  Again has no lower extremity symptoms.  She is 34 is never had mammography.  Her mother had colon cancer but this was in the age of 40s she has not had a colonoscopy herself.  Overall she is doing well but is still concerned about the amount of chest pain she is having with any activity.  She is an Banker.  She has a 47-year-old child with no plans to get pregnant in the future.  Past Medical History:  Diagnosis Date  . Allergy   . Anxiety   . Anxiety   . Asthma   . Celiac disease   . Celiac disease   . DVT (deep venous thrombosis) ([facility])    Of iliac vein of LLE  . Endometriosis   . Endometriosis   . Environmental allergies   . Epigastric pain   . Family history of adverse reaction to anesthesia    SISTER HAD TROUBLE BREATHING  . GERD (gastroesophageal reflux disease)   . Headache   . Hx of migraine headaches   . Hx of migraine headaches   . Hypoglycemia   . Hypoglycemia   . Leukocytosis   . Low back pain   . Nausea   . PE (pulmonary thromboembolism) ([place]) 03/2018  . PE (pulmonary thromboembolism) ([place])   . PVC  (premature ventricular contraction)   . Retinal edema   . Retinal edema   . RUQ abdominal pain   . Tinnitus   . Tinnitus    Family History  Problem Relation Age of Onset  . Asthma Mother   . Emphysema Maternal Grandmother        smoker  . Emphysema Maternal Grandfather   . Colon cancer Paternal Grandmother   . Heart disease Paternal Grandfather   . Heart attack Paternal Grandfather   . Allergies Unknown        both sides of family  . Asthma Brother   . Breast cancer Unknown        father side  . Migraines Sister   . Endometriosis Sister   . Allergies Sister   . Allergies Brother    Past Surgical History:  Procedure Laterality Date  . DILATION AND CURETTAGE OF UTERUS    . tubes in ears     age 76  . WISDOM TOOTH EXTRACTION      Short Social History:  Social History   Tobacco Use  . Smoking status: Former Smoker    Packs/day: 1.00    Years: 5.00  Pack years: 5.00    Types: Cigarettes    Last attempt to quit: 11/26/2001    Years since quitting: 16.6  . Smokeless tobacco: Never Used  Substance Use Topics  . Alcohol use: Yes    Alcohol/week: 7.0 standard drinks    Types: 7 Glasses of wine per week    Comment: weekly    No Known Allergies  Current Outpatient Medications  Medication Sig Dispense Refill  . albuterol (PROAIR HFA) 108 (90 Base) MCG/ACT inhaler 2 puffs every 6 hours as needed (Patient taking differently: Inhale 2 puffs into the lungs every 6 (six) hours as needed for wheezing. ) 3 Inhaler 3  . ALPRAZolam (XANAX) 0.25 MG tablet Take 0.125 mg by mouth 2 (two) times daily as needed for anxiety. Take 1/2 tablet BID prn     . apixaban (ELIQUIS) 5 MG TABS tablet Take 1 tablet (5 mg total) by mouth 2 (two) times daily. 60 tablet 0  . budesonide-formoterol (SYMBICORT) 160-4.5 MCG/ACT inhaler Inhale 2 puffs then rinse mouth, twice daily 3 Inhaler 3  . mometasone (NASONEX) 50 MCG/ACT nasal spray Place 2 sprays into the nose daily as needed (allergies).     Marland Kitchen  Spacer/Aero-Holding Chambers (AEROCHAMBER MV) inhaler Use as instructed 1 each 0  . apixaban (ELIQUIS) 5 MG TABS tablet Take 2 tablets (10 mg total) by mouth 2 (two) times daily for 6 days. 24 tablet 0   No current facility-administered medications for this visit.     Review of Systems  Constitutional:  Constitutional negative. HENT: HENT negative.  Eyes: Eyes negative.  Respiratory: Positive for shortness of breath.  Cardiovascular: Positive for chest pain.  GI: Gastrointestinal negative.  Musculoskeletal: Musculoskeletal negative.  Skin: Skin negative.  Neurological: Neurological negative. Hematologic: Hematologic/lymphatic negative.  Psychiatric: Psychiatric negative.        Objective:  Objective   Vitals:   07/21/18 0910  BP: 107/72  Pulse: 69  Resp: 14  Temp: (!) 97.3 F (36.3 C)  TempSrc: Oral  SpO2: 98%  Weight: 138 lb (62.6 kg)  Height: 5' 4"  (1.626 m)   Body mass index is 23.69 kg/m.  Physical Exam  Constitutional: She appears well-developed.  HENT:  Head: Normocephalic.  Eyes: Pupils are equal, round, and reactive to light.  Neck: Normal range of motion.  Cardiovascular: Normal rate.  Pulmonary/Chest: Effort normal.  Abdominal: Soft.  Musculoskeletal: Normal range of motion.  Skin: Skin is warm and dry. Capillary refill takes less than 2 seconds.  Psychiatric: She has a normal mood and affect. Her behavior is normal. Judgment and thought content normal.    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  At the time she had evidence of DVT in her left common iliac vein and her bilateral  lower extremities were not evaluated as a source.  It does appear the possibly she has may Thurner with some compression.  Her only other risk factor at the time was progesterone therapy for endometriosis which she is now not taking but is having issues with her cycle particularly with Eliquis.  She currently is scheduled to take Eliquis for 1 year.  I discussed with her the need to take aspirin lifelong particularly when Eliquis is off and also to have her mammography given that she is past due although I do not think that she has cancer as a risk factor it is certainly a good idea to have everything ruled out.  I discussed with her the CT findings which appear to be [name] and that we could proceed with venogram likely from the left common femoral vein approach with her supine and awake sedation medicine only.  I would use tools of venography as well as intravascular ultrasound and also pressure gradient prior to placing a stent in such a young person.  I do think that stenting for her is a possibility to remove this as a risk factor for further DVT moving forward.  She is very intelligent demonstrated very good understanding does not want to proceed with surgery at this time I think that is reasonable given that she is anticoagulated but we will likely proceed prior to her coming off of anticoagulation which is slated for May 2020.  She wants to follow-up for further discussion we will evaluate her bilateral lower extremities for DVT at that time to ensure that we can indeed approach this from the left common femoral vein and out the popliteal.     Waynetta Sandy MD [facility]

## 2018-07-23 ENCOUNTER — Other Ambulatory Visit: Payer: Self-pay

## 2018-07-23 DIAGNOSIS — I871 Compression of vein: Secondary | ICD-10-CM

## 2018-09-12 ENCOUNTER — Encounter (HOSPITAL_COMMUNITY): Payer: Managed Care, Other (non HMO)

## 2018-09-12 ENCOUNTER — Ambulatory Visit: Payer: Managed Care, Other (non HMO) | Admitting: Vascular Surgery

## 2018-09-19 ENCOUNTER — Ambulatory Visit (INDEPENDENT_AMBULATORY_CARE_PROVIDER_SITE_OTHER): Payer: Managed Care, Other (non HMO) | Admitting: Vascular Surgery

## 2018-09-19 ENCOUNTER — Ambulatory Visit (HOSPITAL_COMMUNITY)
Admission: RE | Admit: 2018-09-19 | Discharge: 2018-09-19 | Disposition: A | Discharge status: Home / Self Care | Payer: Managed Care, Other (non HMO) | Source: Ambulatory Visit | Attending: Vascular Surgery | Admitting: Vascular Surgery

## 2018-09-19 ENCOUNTER — Encounter: Payer: Self-pay | Admitting: Vascular Surgery

## 2018-09-19 VITALS — BP 107/68 | HR 68 | Temp 97.1°F | Resp 16 | Ht 64.0 in | Wt 137.8 lb

## 2018-09-19 DIAGNOSIS — I871 Compression of vein: Secondary | ICD-10-CM

## 2018-09-19 NOTE — Progress Notes (Signed)
Patient ID: Sierra Olsen, female   DOB: January 30, 1976, 42 y.o.   MRN: [id number]  Reason for Consult: Leg Problem (dvt, tenderness)   Referred by Marda Stalker, PA-C  Subjective:     HPI:  Sierra Olsen is a 42 y.o. 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  She continues to have back and chest pain has not gone back to her normal level of activity.  She remains on Eliquis and has had some bleeding issues with her periods.  She also has some abdominal pain with a diagnosis of endometriosis and is thinking she will ultimately have hysterectomy.  At the time of her DVT and PE she had been started on progesterone therapy she is now not taking any hormonal therapy.  She has been reluctant to undergo procedure.  She does not have any leg swelling or pain on either side this was her first episode of DVT.  Past Medical History:  Diagnosis Date  . Allergy   . Anxiety   . Anxiety   . Asthma   . Celiac disease   . Celiac disease   . DVT (deep venous thrombosis) (HCC)    Of iliac vein of LLE  . Endometriosis   . Endometriosis   . Environmental allergies   . Epigastric pain   . Family history of adverse reaction to anesthesia    SISTER HAD TROUBLE BREATHING  . GERD (gastroesophageal reflux disease)   . Headache   . Hx of migraine headaches   . Hx of migraine headaches   . Hypoglycemia   . Hypoglycemia   . Leukocytosis   . Low back pain   . Nausea   . PE (pulmonary thromboembolism) ([place]) 03/2018  . PE (pulmonary thromboembolism) ([place])   . PVC (premature ventricular contraction)   . Retinal edema   . Retinal edema   . RUQ abdominal pain   . Tinnitus   . Tinnitus    Family History  Problem Relation Age of Onset  . Asthma Mother   . Emphysema Maternal Grandmother        smoker  . Emphysema Maternal Grandfather   . Colon cancer Paternal Grandmother   . Heart disease Paternal Grandfather   . Heart attack Paternal Grandfather     . Allergies Unknown        both sides of family  . Asthma Brother   . Breast cancer Unknown        father side  . Migraines Sister   . Endometriosis Sister   . Allergies Sister   . Allergies Brother    Past Surgical History:  Procedure Laterality Date  . DILATION AND CURETTAGE OF UTERUS    . tubes in ears     age 63  . WISDOM TOOTH EXTRACTION      Short Social History:  Social History   Tobacco Use  . Smoking status: Former Smoker    Packs/day: 1.00    Years: 5.00    Pack years: 5.00    Types: Cigarettes    Last attempt to quit: 11/26/2001    Years since quitting: 16.8  . Smokeless tobacco: Never Used  Substance Use Topics  . Alcohol use: Yes    Alcohol/week: 7.0 standard drinks    Types: 7 Glasses of wine per week    Comment: weekly    No Known Allergies  Current Outpatient Medications  Medication Sig Dispense Refill  . albuterol (PROAIR  HFA) 108 (90 Base) MCG/ACT inhaler 2 puffs every 6 hours as needed (Patient taking differently: Inhale 2 puffs into the lungs every 6 (six) hours as needed for wheezing. ) 3 Inhaler 3  . ALPRAZolam (XANAX) 0.25 MG tablet Take 0.125 mg by mouth 2 (two) times daily as needed for anxiety. Take 1/2 tablet BID prn     . apixaban (ELIQUIS) 5 MG TABS tablet Take 1 tablet (5 mg total) by mouth 2 (two) times daily. 60 tablet 0  . budesonide-formoterol (SYMBICORT) 160-4.5 MCG/ACT inhaler Inhale 2 puffs then rinse mouth, twice daily 3 Inhaler 3  . mometasone (NASONEX) 50 MCG/ACT nasal spray Place 2 sprays into the nose daily as needed (allergies).     Marland Kitchen Spacer/Aero-Holding Chambers (AEROCHAMBER MV) inhaler Use as instructed 1 each 0   No current facility-administered medications for this visit.     Review of Systems  Constitutional:  Constitutional negative. HENT: HENT negative.  Eyes: Eyes negative.  Respiratory: Positive for shortness of breath.  Cardiovascular: Positive for chest pain.  GI: Positive for abdominal pain.   Musculoskeletal: Musculoskeletal negative.  Neurological: Neurological negative. Hematologic: Hematologic/lymphatic negative.  Psychiatric: Psychiatric negative.        Objective:  Objective   Vitals:   09/19/18 0854  BP: 107/68  Pulse: 68  Resp: 16  Temp: (!) 97.1 F (36.2 C)  TempSrc: Oral  SpO2: 98%  Weight: 137 lb 12.8 oz (62.5 kg)  Height: 5' 4"  (1.626 m)   Body mass index is 23.65 kg/m.  Physical Exam  Constitutional: She is oriented to person, place, and time. She appears well-developed.  HENT:  Head: Normocephalic.  Eyes: Pupils are equal, round, and reactive to light.  Cardiovascular: Normal rate.  Pulses:      Radial pulses are 2+ on the right side, and 2+ on the left side.       Dorsalis pedis pulses are 2+ on the right side, and 2+ on the left side.  Pulmonary/Chest: Effort normal.  Abdominal: Soft. She exhibits no mass.  Musculoskeletal: Normal range of motion. She exhibits no edema.  Neurological: She is oriented to person, place, and time.  Skin: Skin is warm and dry.  Psychiatric: She has a normal mood and affect. Her behavior is normal. Judgment and thought content normal.    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  She remains on Eliquis and well for 1 year.  She has undergone hypercoagulable work-up that was negative.  At the time she was taking progesterone therapy for her history of endometriosis but she has stopped that.  She is tolerating Eliquis well although she does have heavy periods.  I discussed with her that until she is evaluated for [name] from an endovascular standpoint she should remain on Eliquis.  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  At this time she is reluctant to undergo any procedure that would require stenting and as long as she stays on Eliquis and aspirin she should be safe.  We also discussed other efforts to limit her future DVT which were mean remaining active and hydrated  particularly with travel and also wearing her compression stockings.  She is not up-to-date on her mammograms and she will contact her primary care doctor to have this done.  She is asked me about a second opinion of I have told her I will be happy to refer her although it would be to either [place] or [place] for the second opinion.  At this time she does not want to schedule either a second opinion visit or procedure with me.  If she like to call back to get the second opinion her scheduled procedure we will certainly set her up for that.  Procedure would be venogram from a left femoral vein approach supine with moderate sedation.  She demonstrates good understanding of this.  She will otherwise follow-up with me on an as-needed basis.     Waynetta Sandy MD [facility]

## 2018-10-30 ENCOUNTER — Other Ambulatory Visit: Payer: Self-pay | Admitting: Obstetrics and Gynecology

## 2018-10-30 ENCOUNTER — Other Ambulatory Visit (HOSPITAL_COMMUNITY): Payer: Self-pay | Admitting: Obstetrics and Gynecology

## 2018-10-30 DIAGNOSIS — N809 Endometriosis, unspecified: Secondary | ICD-10-CM

## 2018-12-05 ENCOUNTER — Other Ambulatory Visit: Payer: Self-pay | Admitting: *Deleted

## 2018-12-05 DIAGNOSIS — I871 Compression of vein: Secondary | ICD-10-CM

## 2018-12-05 MED ORDER — APIXABAN 5 MG PO TABS: 5.0000 mg | ORAL_TABLET | Freq: Two times a day (BID) | ORAL | 2 refills | Status: DC

## 2018-12-15 ENCOUNTER — Other Ambulatory Visit: Payer: Self-pay | Admitting: Family Medicine

## 2018-12-15 DIAGNOSIS — Z1231 Encounter for screening mammogram for malignant neoplasm of breast: Secondary | ICD-10-CM

## 2018-12-19 ENCOUNTER — Ambulatory Visit (HOSPITAL_COMMUNITY)
Admission: RE | Admit: 2018-12-19 | Discharge: 2018-12-19 | Disposition: A | Discharge status: Home / Self Care | Payer: Managed Care, Other (non HMO) | Source: Ambulatory Visit | Attending: Obstetrics and Gynecology | Admitting: Obstetrics and Gynecology

## 2018-12-19 DIAGNOSIS — N809 Endometriosis, unspecified: Secondary | ICD-10-CM | POA: Insufficient documentation

## 2018-12-26 ENCOUNTER — Encounter: Payer: Self-pay | Admitting: Vascular Surgery

## 2018-12-26 ENCOUNTER — Ambulatory Visit (INDEPENDENT_AMBULATORY_CARE_PROVIDER_SITE_OTHER): Payer: Managed Care, Other (non HMO) | Admitting: Vascular Surgery

## 2018-12-26 ENCOUNTER — Other Ambulatory Visit: Payer: Self-pay | Admitting: Vascular Surgery

## 2018-12-26 ENCOUNTER — Other Ambulatory Visit: Payer: Self-pay

## 2018-12-26 VITALS — BP 106/72 | HR 63 | Temp 97.5°F | Resp 20 | Ht 64.0 in | Wt 137.0 lb

## 2018-12-26 DIAGNOSIS — I871 Compression of vein: Secondary | ICD-10-CM | POA: Diagnosis not present

## 2018-12-26 NOTE — Progress Notes (Signed)
Patient ID: Sierra Olsen, female   DOB: 01-29-1976, 43 y.o.   MRN: [id number]  Reason for Consult: Follow-up (discuss possible surgery)   Referred by Marda Stalker, PA-C  Subjective:     HPI:  Sierra Olsen is a 43 y.o. female with history of left common iliac vein DVT complicated by PE.  She remains on Eliquis.  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  She has had a second opinion in [place] she was offered stenting there she now returns for stenting here.  She does not have any pain in her left lower extremity nor swelling.  Pleuritic pain has resolved and her activity is increasing.  Past Medical History:  Diagnosis Date  . Allergy   . Anxiety   . Anxiety   . Asthma   . Celiac disease   . Celiac disease   . DVT (deep venous thrombosis) (HCC)    Of iliac vein of LLE  . Endometriosis   . Endometriosis   . Environmental allergies   . Epigastric pain   . Family history of adverse reaction to anesthesia    SISTER HAD TROUBLE BREATHING  . GERD (gastroesophageal reflux disease)   . Headache   . Hx of migraine headaches   . Hx of migraine headaches   . Hypoglycemia   . Hypoglycemia   . Leukocytosis   . Low back pain   . Nausea   . PE (pulmonary thromboembolism) ([place]) 03/2018  . PE (pulmonary thromboembolism) ([place])   . PVC (premature ventricular contraction)   . Retinal edema   . Retinal edema   . RUQ abdominal pain   . Tinnitus   . Tinnitus    Family History  Problem Relation Age of Onset  . Asthma Mother   . Emphysema Maternal Grandmother        smoker  . Emphysema Maternal Grandfather   . Colon cancer Paternal Grandmother   . Heart disease Paternal Grandfather   . Heart attack Paternal Grandfather   . Allergies Other        both sides of family  . Asthma Brother   . Breast cancer Other        father side  . Migraines Sister   . Endometriosis Sister   . Allergies Sister   . Allergies Brother    Past Surgical History:    Procedure Laterality Date  . DILATION AND CURETTAGE OF UTERUS    . tubes in ears     age 18  . WISDOM TOOTH EXTRACTION      Short Social History:  Social History   Tobacco Use  . Smoking status: Former Smoker    Packs/day: 1.00    Years: 5.00    Pack years: 5.00    Types: Cigarettes    Last attempt to quit: 11/26/2001    Years since quitting: 17.0  . Smokeless tobacco: Never Used  Substance Use Topics  . Alcohol use: Yes    Alcohol/week: 7.0 standard drinks    Types: 7 Glasses of wine per week    Comment: weekly    No Known Allergies  Current Outpatient Medications  Medication Sig Dispense Refill  . albuterol (PROAIR HFA) 108 (90 Base) MCG/ACT inhaler 2 puffs every 6 hours as needed (Patient taking differently: Inhale 2 puffs into the lungs every 6 (six) hours as needed for wheezing. ) 3 Inhaler 3  . ALPRAZolam (XANAX) 0.25 MG tablet Take 0.125 mg by mouth 2 (two) times  daily as needed for anxiety. Take 1/2 tablet BID prn     . apixaban (ELIQUIS) 5 MG TABS tablet Take 1 tablet (5 mg total) by mouth 2 (two) times daily. 180 tablet 2  . budesonide-formoterol (SYMBICORT) 160-4.5 MCG/ACT inhaler Inhale 2 puffs then rinse mouth, twice daily 3 Inhaler 3  . mometasone (NASONEX) 50 MCG/ACT nasal spray Place 2 sprays into the nose daily as needed (allergies).     Marland Kitchen Spacer/Aero-Holding Chambers (AEROCHAMBER MV) inhaler Use as instructed 1 each 0   No current facility-administered medications for this visit.     Review of Systems  Constitutional:  Constitutional negative. HENT: HENT negative.  Eyes: Eyes negative.  Respiratory: Respiratory negative.  Cardiovascular: Cardiovascular negative.  GI: Gastrointestinal negative.  Musculoskeletal: Musculoskeletal negative.  Neurological: Neurological negative. Hematologic: Hematologic/lymphatic negative.  Psychiatric: Psychiatric negative.        Objective:  Objective   Vitals:   12/26/18 1519  BP: 106/72  Pulse: 63  Resp: 20   Temp: (!) 97.5 F (36.4 C)  SpO2: 99%  Weight: 137 lb (62.1 kg)  Height: 5' 4"  (1.626 m)   Body mass index is 23.52 kg/m.  Physical Exam Constitutional:      Appearance: Normal appearance.  Eyes:     Pupils: Pupils are equal, round, and reactive to light.  Neck:     Musculoskeletal: Normal range of motion.  Cardiovascular:     Rate and Rhythm: Normal rate.     Pulses:          Radial pulses are 2+ on the right side and 2+ on the left side.       Popliteal pulses are 2+ on the right side and 2+ on the left side.  Pulmonary:     Effort: Pulmonary effort is normal.  Abdominal:     General: Abdomen is flat.     Palpations: Abdomen is soft.  Musculoskeletal: Normal range of motion.        General: No swelling.  Skin:    General: Skin is warm.     Capillary Refill: Capillary refill takes less than 2 seconds.  Neurological:     General: No focal deficit present.     Mental Status: She is alert.  Psychiatric:        Mood and Affect: Mood normal.        Behavior: Behavior normal.        Thought Content: Thought content normal.        Judgment: Judgment normal.     Data: I again reviewed her CT scan with her which demonstrates her may Thurner where she previously had clot.     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  She remains on Eliquis and has been on for 9 months now.  She is not having any further bleeding issues from her endometriosis.  She has seen Dr. Ida Rogue in [place] who also offered stenting to resolve her blockage.  She states that she would rather have this done closer to home and I have discussed the risk benefits alternatives.  We would approach from left femoral vein given that she did not have any blockage there in the past duplex.  She can take Eliquis up until the day before and will need for 3 months after.  She was lifelong aspirin afterwards.  We have discussed this at length and she demonstrates good  understanding we will schedule her today.    Waynetta Sandy  MD [facility]

## 2019-01-12 ENCOUNTER — Encounter (HOSPITAL_COMMUNITY)
Admission: RE | Disposition: A | Discharge status: Home / Self Care | Payer: Self-pay | Source: Home / Self Care | Attending: Vascular Surgery

## 2019-01-12 ENCOUNTER — Observation Stay (HOSPITAL_COMMUNITY)
Admission: RE | Admit: 2019-01-12 | Discharge: 2019-01-13 | Disposition: A | Discharge status: Home / Self Care | Payer: Managed Care, Other (non HMO) | Attending: Vascular Surgery | Admitting: Vascular Surgery

## 2019-01-12 ENCOUNTER — Other Ambulatory Visit: Payer: Self-pay

## 2019-01-12 ENCOUNTER — Encounter (HOSPITAL_COMMUNITY): Payer: Self-pay | Admitting: Vascular Surgery

## 2019-01-12 DIAGNOSIS — I82409 Acute embolism and thrombosis of unspecified deep veins of unspecified lower extremity: Principal | ICD-10-CM | POA: Diagnosis present

## 2019-01-12 DIAGNOSIS — Z91041 Radiographic dye allergy status: Secondary | ICD-10-CM | POA: Insufficient documentation

## 2019-01-12 DIAGNOSIS — J4520 Mild intermittent asthma, uncomplicated: Secondary | ICD-10-CM | POA: Insufficient documentation

## 2019-01-12 DIAGNOSIS — I871 Compression of vein: Secondary | ICD-10-CM

## 2019-01-12 DIAGNOSIS — Z7951 Long term (current) use of inhaled steroids: Secondary | ICD-10-CM | POA: Insufficient documentation

## 2019-01-12 DIAGNOSIS — Z7901 Long term (current) use of anticoagulants: Secondary | ICD-10-CM | POA: Insufficient documentation

## 2019-01-12 DIAGNOSIS — K219 Gastro-esophageal reflux disease without esophagitis: Secondary | ICD-10-CM | POA: Insufficient documentation

## 2019-01-12 DIAGNOSIS — Z86711 Personal history of pulmonary embolism: Secondary | ICD-10-CM | POA: Insufficient documentation

## 2019-01-12 DIAGNOSIS — Z87891 Personal history of nicotine dependence: Secondary | ICD-10-CM | POA: Insufficient documentation

## 2019-01-12 DIAGNOSIS — Z825 Family history of asthma and other chronic lower respiratory diseases: Secondary | ICD-10-CM | POA: Diagnosis not present

## 2019-01-12 DIAGNOSIS — Z79899 Other long term (current) drug therapy: Secondary | ICD-10-CM | POA: Insufficient documentation

## 2019-01-12 DIAGNOSIS — I82422 Acute embolism and thrombosis of left iliac vein: Secondary | ICD-10-CM

## 2019-01-12 DIAGNOSIS — K900 Celiac disease: Secondary | ICD-10-CM | POA: Diagnosis not present

## 2019-01-12 HISTORY — PX: PERIPHERAL VASCULAR INTERVENTION: CATH118257

## 2019-01-12 HISTORY — DX: Acute embolism and thrombosis of unspecified deep veins of unspecified lower extremity: I82.409

## 2019-01-12 HISTORY — PX: LOWER EXTREMITY VENOGRAPHY: CATH118253

## 2019-01-12 HISTORY — PX: INTRAVASCULAR ULTRASOUND/IVUS: CATH118244

## 2019-01-12 LAB — CREATININE, SERUM
Creatinine, Ser: 0.69 mg/dL (ref 0.44–1.00)
GFR calc non Af Amer: >60 mL/min (ref >60)

## 2019-01-12 LAB — CBC
HEMATOCRIT: 38.2 % (ref 36.0–46.0)
Hemoglobin: 12.2 g/dL (ref 12.0–15.0)
MCH: 29.8 pg (ref 26.0–34.0)
MCHC: 31.9 g/dL (ref 30.0–36.0)
MCV: 93.2 fL (ref 80.0–100.0)
NRBC: 0 % (ref 0.0–0.2)
Platelets: 179 10*3/uL (ref 150–400)
RBC: 4.1 MIL/uL (ref 3.87–5.11)
RDW: 12.6 % (ref 11.5–15.5)
WBC: 6.1 10*3/uL (ref 4.0–10.5)

## 2019-01-12 LAB — POCT I-STAT 4, (NA,K, GLUC, HGB,HCT)
Glucose, Bld: 93 mg/dL (ref 70–99)
HEMATOCRIT: 38 % (ref 36.0–46.0)
Hemoglobin: 12.9 g/dL (ref 12.0–15.0)
Potassium: 3.7 mmol/L (ref 3.5–5.1)
Sodium: 140 mmol/L (ref 135–145)

## 2019-01-12 LAB — PREGNANCY, URINE: Preg Test, Ur: NEGATIVE

## 2019-01-12 LAB — POCT I-STAT CREATININE: Creatinine, Ser: 0.7 mg/dL (ref 0.44–1.00)

## 2019-01-12 SURGERY — LOWER EXTREMITY VENOGRAPHY
Anesthesia: LOCAL

## 2019-01-12 MED ORDER — LIDOCAINE HCL (PF) 1 % IJ SOLN
INTRAMUSCULAR | Status: AC
  Filled 2019-01-12: qty 60

## 2019-01-12 MED ORDER — ACETAMINOPHEN 325 MG PO TABS: 325.0000 mg | ORAL_TABLET | Freq: Four times a day (QID) | ORAL | Status: DC | PRN

## 2019-01-12 MED ORDER — KETOROLAC TROMETHAMINE 30 MG/ML IJ SOLN: 30.0000 mg | Freq: Four times a day (QID) | INTRAMUSCULAR | Status: DC | PRN

## 2019-01-12 MED ORDER — ASPIRIN EC 81 MG PO TBEC
81.0000 mg | DELAYED_RELEASE_TABLET | Freq: Every day | ORAL | Status: DC
  Administered 2019-01-12 – 2019-01-13 (×2): 81 mg via ORAL
  Filled 2019-01-12 (×2): qty 1

## 2019-01-12 MED ORDER — HEPARIN (PORCINE) IN NACL 1000-0.9 UT/500ML-% IV SOLN
INTRAVENOUS | Status: AC
  Filled 2019-01-12: qty 500

## 2019-01-12 MED ORDER — LIDOCAINE HCL (PF) 1 % IJ SOLN
INTRAMUSCULAR | Status: DC | PRN
  Administered 2019-01-12: 50 mL

## 2019-01-12 MED ORDER — ALPRAZOLAM 0.25 MG PO TABS: 0.1250 mg | ORAL_TABLET | Freq: Two times a day (BID) | ORAL | Status: DC | PRN

## 2019-01-12 MED ORDER — HEPARIN SODIUM (PORCINE) 5000 UNIT/ML IJ SOLN
5000.0000 [IU] | Freq: Three times a day (TID) | INTRAMUSCULAR | Status: DC
  Administered 2019-01-12 – 2019-01-13 (×2): 5000 [IU] via SUBCUTANEOUS
  Filled 2019-01-12 (×2): qty 1

## 2019-01-12 MED ORDER — SODIUM CHLORIDE 0.9 % IV SOLN: 250.0000 mL | INTRAVENOUS | Status: DC | PRN

## 2019-01-12 MED ORDER — SODIUM CHLORIDE 0.9 % IV SOLN
INTRAVENOUS | Status: DC
  Administered 2019-01-12: 06:00:00 via INTRAVENOUS

## 2019-01-12 MED ORDER — FENTANYL CITRATE (PF) 100 MCG/2ML IJ SOLN
INTRAMUSCULAR | Status: AC
  Filled 2019-01-12: qty 2

## 2019-01-12 MED ORDER — SODIUM CHLORIDE 0.9 % IV SOLN: INTRAVENOUS | Status: AC

## 2019-01-12 MED ORDER — HEPARIN (PORCINE) IN NACL 1000-0.9 UT/500ML-% IV SOLN
INTRAVENOUS | Status: DC | PRN
  Administered 2019-01-12: 500 mL

## 2019-01-12 MED ORDER — FENTANYL CITRATE (PF) 100 MCG/2ML IJ SOLN
INTRAMUSCULAR | Status: DC | PRN
  Administered 2019-01-12 (×4): 25 ug via INTRAVENOUS

## 2019-01-12 MED ORDER — DIPHENHYDRAMINE HCL 50 MG/ML IJ SOLN
25.0000 mg | Freq: Once | INTRAMUSCULAR | Status: AC
  Administered 2019-01-12: 25 mg via INTRAVENOUS
  Filled 2019-01-12: qty 1

## 2019-01-12 MED ORDER — HYDRALAZINE HCL 20 MG/ML IJ SOLN: 5.0000 mg | INTRAMUSCULAR | Status: DC | PRN

## 2019-01-12 MED ORDER — ADULT MULTIVITAMIN W/MINERALS CH
1.0000 | ORAL_TABLET | Freq: Every day | ORAL | Status: DC
  Administered 2019-01-13: 1 via ORAL
  Filled 2019-01-12 (×2): qty 1

## 2019-01-12 MED ORDER — LABETALOL HCL 5 MG/ML IV SOLN: 10.0000 mg | INTRAVENOUS | Status: DC | PRN

## 2019-01-12 MED ORDER — IODIXANOL 320 MG/ML IV SOLN
INTRAVENOUS | Status: DC | PRN
  Administered 2019-01-12: 5 mL via INTRAVENOUS

## 2019-01-12 MED ORDER — ACETAMINOPHEN 325 MG PO TABS
650.0000 mg | ORAL_TABLET | ORAL | Status: DC | PRN
  Administered 2019-01-12 – 2019-01-13 (×4): 650 mg via ORAL
  Filled 2019-01-12 (×4): qty 2

## 2019-01-12 MED ORDER — ALBUTEROL SULFATE (2.5 MG/3ML) 0.083% IN NEBU: 3.0000 mL | INHALATION_SOLUTION | Freq: Four times a day (QID) | RESPIRATORY_TRACT | Status: DC | PRN

## 2019-01-12 MED ORDER — KETOROLAC TROMETHAMINE 30 MG/ML IJ SOLN
INTRAMUSCULAR | Status: DC | PRN
  Administered 2019-01-12: 30 mg via INTRAVENOUS

## 2019-01-12 MED ORDER — MIDAZOLAM HCL 2 MG/2ML IJ SOLN
INTRAMUSCULAR | Status: DC | PRN
  Administered 2019-01-12 (×2): 1 mg via INTRAVENOUS

## 2019-01-12 MED ORDER — SODIUM CHLORIDE 0.9% FLUSH
3.0000 mL | Freq: Two times a day (BID) | INTRAVENOUS | Status: DC
  Administered 2019-01-12 (×2): 3 mL via INTRAVENOUS

## 2019-01-12 MED ORDER — SODIUM CHLORIDE 0.9% FLUSH: 3.0000 mL | INTRAVENOUS | Status: DC | PRN

## 2019-01-12 MED ORDER — HEPARIN SODIUM (PORCINE) 1000 UNIT/ML IJ SOLN
INTRAMUSCULAR | Status: DC | PRN
  Administered 2019-01-12: 6000 [IU] via INTRAVENOUS

## 2019-01-12 MED ORDER — KETOROLAC TROMETHAMINE 30 MG/ML IJ SOLN
30.0000 mg | Freq: Once | INTRAMUSCULAR | Status: DC
  Filled 2019-01-12: qty 1

## 2019-01-12 MED ORDER — ONDANSETRON HCL 4 MG/2ML IJ SOLN: 4.0000 mg | Freq: Four times a day (QID) | INTRAMUSCULAR | Status: DC | PRN

## 2019-01-12 MED ORDER — MIDAZOLAM HCL 2 MG/2ML IJ SOLN
INTRAMUSCULAR | Status: AC
  Filled 2019-01-12: qty 2

## 2019-01-12 SURGICAL SUPPLY — 18 items
BAG SNAP BAND KOVER 36X36 (MISCELLANEOUS) ×3 IMPLANT
BALLN MUSTANG 10X80X75 (BALLOONS) ×3
BALLN MUSTANG 12X80X75 (BALLOONS) ×3
BALLOON MUSTANG 10X80X75 (BALLOONS) ×2 IMPLANT
BALLOON MUSTANG 12X80X75 (BALLOONS) ×2 IMPLANT
CATH VISIONS PV .035 IVUS (CATHETERS) ×3 IMPLANT
COVER DOME SNAP 22 D (MISCELLANEOUS) ×3 IMPLANT
GLIDEWIRE ADV .035X260CM (WIRE) ×3 IMPLANT
KIT ENCORE 26 ADVANTAGE (KITS) ×3 IMPLANT
KIT MICROPUNCTURE NIT STIFF (SHEATH) ×3 IMPLANT
PROTECTION STATION PRESSURIZED (MISCELLANEOUS) ×3
SHEATH PINNACLE 8F 10CM (SHEATH) ×3 IMPLANT
SHEATH PINNACLE 9F 10CM (SHEATH) ×3 IMPLANT
SHEATH PROBE COVER 6X72 (BAG) ×3 IMPLANT
STATION PROTECTION PRESSURIZED (MISCELLANEOUS) ×2 IMPLANT
STENT VICI VENOUS 14X120 (Permanent Stent) ×3 IMPLANT
TRAY PV CATH (CUSTOM PROCEDURE TRAY) ×3 IMPLANT
WIRE TORQFLEX AUST .018X40CM (WIRE) ×3 IMPLANT

## 2019-01-12 NOTE — Progress Notes (Signed)
Notified Dr Donzetta Matters that patient states had dye allergy-wheezing over 20 years ago.  Patient states they usually just give her benadryl.  New Order from Dr Donzetta Matters for Benadryl

## 2019-01-12 NOTE — Progress Notes (Signed)
Patient arrived the unit from Cath lab on a stretcher, assessment completed see flowsheet, patient oriented to room and staff, bed in lowest position, call bell within reach will continue to monitor.

## 2019-01-12 NOTE — H&P (Signed)
   History and Physical Update  The patient was interviewed and re-examined.  The patient's previous History and Physical has been reviewed and is unchanged from recent office visit. Plan for left sided venogram with possible stenting.   Brunella Wileman C. Donzetta Matters, MD [facility] [place] Office: [phone] Pager: [phone]   01/12/2019, 7:29 AM

## 2019-01-12 NOTE — Progress Notes (Addendum)
Notified Anderson Malta of patient stating she has had a dye allergy.  Anderson Malta will check with Dr. Donzetta Matters. Also let Anderson Malta know that patient last took Eliquis on Saturday which she stated is fine.

## 2019-01-12 NOTE — Op Note (Signed)
    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 Donzetta Matters, MD Procedure Performed: 1.  Ultrasound-guided cannulation left common femoral vein 2.  Intravascular ultrasound of IVC, left common and external iliac veins 3.  Central venography 4.  Stent of left common and external iliac veins with 14 x 120 mm Vici 5.  Moderate sedation with fentanyl and Versed for 53 minutes  Indications: 43 year old female with a history of pulmonary embolus found to have [name].  She has been maintained on Eliquis.  She now presents for venogram with intravascular ultrasound and possible stenting.  Findings: By intravascular ultrasound at the level of L4 there was consistent with [name] with diameter of approximately 3 mm in anterior posterior window.  She was functionally occluded at the level of the bifurcation of the internal and external iliac veins on the left.  Following stenting of the common down to the external leg vein there is a diameter approximately 14 x 10 at the level where she was previously occluded she is 12 mm.  Completion venography was not performed given contrast allergy.   Procedure:  The patient was identified in the holding area and taken to room 8.  The patient was then placed supine on the table and prepped and draped in the usual sterile fashion.  A time out was called.  Ultrasound was used to evaluate the left common femoral vein this was cannulated micropuncture needle followed by wire and sheath.  I placed a Glidewire advantage followed by 8 [place] sheath.  Patient was fully heparinized.  The lower advantage was placed into the right innominate vein under fluoroscopic guidance.  We then performed intravascular ultrasound of the IVC followed by common external leg veins identified our areas of stenosis and narrowing and plan for  stenting.  The wire was first confirmed in physician.  Venogram was performed which confirmed although did not demonstrate or AP views.  A 14 x 120 stent was then deployed.  This was postdilated with first with 10 mm balloon followed by 12 mm balloon completion IVUS demonstrated resolution of our areas of stenosis narrowing.  30 mg of Toradol was given given patient's discomfort.  She otherwise did tolerate procedure well.  The wire was removed followed by sheath pressure held for 10 minutes until hemostasis obtained.  Contrast: 5 cc    Laurella Tull C. Donzetta Matters, MD [facility] [place] Office: [phone] Pager: [phone]

## 2019-01-13 DIAGNOSIS — I82409 Acute embolism and thrombosis of unspecified deep veins of unspecified lower extremity: Secondary | ICD-10-CM | POA: Diagnosis not present

## 2019-01-13 LAB — BASIC METABOLIC PANEL
Anion gap: 8 (ref 5–15)
BUN: 12 mg/dL (ref 6–20)
CHLORIDE: 109 mmol/L (ref 98–111)
CO2: 22 mmol/L (ref 22–32)
Calcium: 8.8 mg/dL — ABNORMAL LOW (ref 8.9–10.3)
Creatinine, Ser: 0.82 mg/dL (ref 0.44–1.00)
GFR calc Af Amer: >60 mL/min (ref >60)
GFR calc non Af Amer: >60 mL/min (ref >60)
Glucose, Bld: 90 mg/dL (ref 70–99)
Potassium: 4 mmol/L (ref 3.5–5.1)
Sodium: 139 mmol/L (ref 135–145)

## 2019-01-13 MED ORDER — OXYCODONE-ACETAMINOPHEN 5-325 MG PO TABS: 1.0000 | ORAL_TABLET | Freq: Four times a day (QID) | ORAL | 0 refills | Status: DC | PRN

## 2019-01-13 NOTE — Plan of Care (Signed)
Care plan has been reviewed.  Problem: Activity: post-op day 1, limited movement , surgical site on left groin. Goal: Risk for activity intolerance will decrease Outcome: Progressing, independently ambulated in her room, no assistive device needed. Wound; dressing has no drainage, dry, clean and intact. Good blood flow through left and right extremities.   Problem: Clinical Measurements: CCMD called to notified HR at night 43-55, sinus bradycardia on monitor. Goal: Cardiovascular complication will be avoided Outcome: Progressing, Pt is asymptomatic, no distress., BP stable. Will continue to monitor.   Problem: Clinical Measurements: on room air , spo2 98%, no distress Goal: Respiratory complications will improve Outcome: Progressing  Kennyth Lose, BSN,RN,PCCN-CMC

## 2019-01-13 NOTE — Discharge Summary (Signed)
Discharge Summary    Sierra Olsen May 05, 1976 43 y.o. female  [id number]  Admission Date: 01/12/2019  Discharge Date: 01/13/2019   Physician: Sierra Olsen*  Admission Diagnosis: DVT (deep venous thrombosis) [facility]) [I82.409]   HPI:   This is a 43 y.o. 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  She remains on Eliquis and has been on for 9 months now.  She is not having any further bleeding issues from her endometriosis.  She has seen Dr. Ida Olsen in [place] who also offered stenting to resolve her blockage.  She states that she would rather have this done closer to home and I have discussed the risk benefits alternatives.  We would approach from left femoral vein given that she did not have any blockage there in the past duplex.  She can take Eliquis up until the day before and will need for 3 months after.  She was lifelong aspirin afterwards.  We have discussed this at length and she demonstrates good understanding we will schedule her today.  Hospital Course:  The patient was admitted to the hospital and taken to the operating room on 01/12/2019 and underwent: Procedure Performed: 1.  Ultrasound-guided cannulation left common femoral vein 2.  Intravascular ultrasound of IVC, left common and external iliac veins 3.  Central venography 4.  Stent of left common and external iliac veins with 14 x 120 mm Vici 5.  Moderate sedation with fentanyl and Versed for 53 minutes  Findings: By intravascular ultrasound at the level of L4 there was consistent with Sierra Olsen syndrome with diameter of approximately 3 mm in anterior posterior window.  She was functionally occluded at the level of the bifurcation of the internal and external iliac veins on the left.  Following stenting of the common down to the external leg vein there is a diameter approximately 14 x 10 at the level where she was previously occluded she is 12 mm.  Completion venography was not performed given contrast  allergy.  The pt tolerated the procedure well and was transported to the PACU in good condition.   By POD 1, pt was doing well.  Continue Eliquis for at least 3 months.  Aspirin is lifelong.    The remainder of the hospital course consisted of increasing mobilization and increasing intake of solids without difficulty.  CBC    Component Value Date/Time   WBC 6.1 01/12/2019 1009   RBC 4.10 01/12/2019 1009   HGB 12.2 01/12/2019 1009   HCT 38.2 01/12/2019 1009   PLT 179 01/12/2019 1009   MCV 93.2 01/12/2019 1009   MCH 29.8 01/12/2019 1009   MCHC 31.9 01/12/2019 1009   RDW 12.6 01/12/2019 1009   LYMPHSABS 2.1 04/09/2018 0155   MONOABS 1.7 (H) 04/09/2018 0155   EOSABS 0.1 04/09/2018 0155   BASOSABS 0.0 04/09/2018 0155    BMET    Component Value Date/Time   NA 139 01/13/2019 0213   K 4.0 01/13/2019 0213   CL 109 01/13/2019 0213   CO2 22 01/13/2019 0213   GLUCOSE 90 01/13/2019 0213   BUN 12 01/13/2019 0213   CREATININE 0.82 01/13/2019 0213   CALCIUM 8.8 (L) 01/13/2019 0213   GFRNONAA >60 01/13/2019 0213   GFRAA >60 01/13/2019 0213      Discharge Instructions    Discharge patient   Complete by:  As directed    Discharge disposition:  01-Home or Self Care   Discharge patient date:  01/13/2019      Discharge Diagnosis:  DVT (deep venous thrombosis) (HCC) [I82.409]  Secondary Diagnosis: Patient Active Problem List   Diagnosis Date Noted  . DVT (deep venous thrombosis) ([place]) 01/12/2019  . PE (pulmonary thromboembolism) ([place]) 04/08/2018  . Pulmonary embolism ([place]) 04/07/2018  . Endometriosis 04/07/2018  . Chest pain of uncertain etiology 38/93/7342  . Asthma, mild intermittent, well-controlled 03/24/2014  . Seasonal and perennial allergic rhinitis 03/24/2014   Past Medical History:  Diagnosis Date  . Allergy   . Anxiety   . Anxiety   . Asthma   . Celiac disease   . Celiac disease   . DVT (deep venous thrombosis) (HCC)    Of iliac vein of LLE  . DVT (deep  venous thrombosis) ([place]) 01/12/2019  . Endometriosis   . Endometriosis   . Environmental allergies   . Epigastric pain   . Family history of adverse reaction to anesthesia    SISTER HAD TROUBLE BREATHING  . GERD (gastroesophageal reflux disease)   . Headache   . Hx of migraine headaches   . Hx of migraine headaches   . Hypoglycemia   . Hypoglycemia   . Leukocytosis   . Low back pain   . Nausea   . PE (pulmonary thromboembolism) ([place]) 03/2018  . PE (pulmonary thromboembolism) ([place])   . PVC (premature ventricular contraction)   . Retinal edema   . Retinal edema   . RUQ abdominal pain   . Tinnitus   . Tinnitus      Allergies as of 01/13/2019      Reactions   Contrast Media [iodinated Diagnostic Agents]    wheezing   Gluten Meal Other (See Comments)   celiac disease      Medication List    TAKE these medications   acetaminophen 325 MG tablet Commonly known as:  TYLENOL Take 325 mg by mouth every 6 (six) hours as needed (for pain.).   AEROCHAMBER MV inhaler Use as instructed   albuterol 108 (90 Base) MCG/ACT inhaler Commonly known as:  PROAIR HFA 2 puffs every 6 hours as needed What changed:    how much to take  how to take this  when to take this  reasons to take this  additional instructions   ALPRAZolam 0.25 MG tablet Commonly known as:  XANAX Take 0.125 mg by mouth 2 (two) times daily as needed for anxiety.   apixaban 5 MG Tabs tablet Commonly known as:  ELIQUIS Take 1 tablet (5 mg total) by mouth 2 (two) times daily.   budesonide-formoterol 160-4.5 MCG/ACT inhaler Commonly known as:  SYMBICORT Inhale 2 puffs then rinse mouth, twice daily What changed:    how much to take  how to take this  when to take this  additional instructions   mometasone 50 MCG/ACT nasal spray Commonly known as:  NASONEX Place 2 sprays into the nose daily as needed (allergies).   multivitamin tablet Take 1 tablet by mouth daily.   oxyCODONE-acetaminophen  5-325 MG tablet Commonly known as:  PERCOCET Take 1 tablet by mouth every 6 (six) hours as needed for severe pain.         Instructions:  [facility]  Discharge Instructions  Lower Extremity Angiogram; Angioplasty/Stenting  Please refer to the following instructions for your post-procedure care. Your surgeon or physician assistant will discuss any changes with you.  Activity  Avoid lifting more than 8 pounds (1 gallons of milk) for 72 hours (3 days) after your procedure. You Sierra walk as much as you  can tolerate. It's OK to drive after 72 hours.  Bathing/Showering  You Sierra shower the day after your procedure. If you have a bandage, you Sierra remove it at 24- 48 hours. Clean your incision site with mild soap and water. Pat the area dry with a clean towel.  Diet  Resume your pre-procedure diet. There are no special food restrictions following this procedure. All patients with peripheral vascular disease should follow a low fat/low cholesterol diet. In order to heal from your surgery, it is CRITICAL to get adequate nutrition. Your body requires vitamins, minerals, and protein. Vegetables are the best source of vitamins and minerals. Vegetables also provide the perfect balance of protein. Processed food has little nutritional value, so try to avoid this.  Medications  Resume taking all of your medications unless your doctor tells you not to. If your incision is causing pain, you Sierra take over-the-counter pain relievers such as acetaminophen (Tylenol)  Follow Up  Follow up will be arranged at the time of your procedure. You Sierra have an office visit scheduled or Sierra be scheduled for surgery. Ask your surgeon if you have any questions.  Please call us immediately for any of the following conditions: .Severe or worsening pain your legs or feet at rest or with walking. .Increased pain, redness, drainage at your groin puncture site. .Fever of 101 degrees  or higher. .If you have any mild or slow bleeding from your puncture site: lie down, apply firm constant pressure over the area with a piece of gauze or a clean wash cloth for 30 minutes- no peeking!, call 911 right away if you are still bleeding after 30 minutes, or if the bleeding is heavy and unmanageable.  Reduce your risk factors of vascular disease:  . Stop smoking. If you would like help call [facility] at 1-800-QUIT-NOW (434)500-5950) or [place] at 517-756-5450. . Manage your cholesterol . Maintain a desired weight . Control your diabetes . Keep your blood pressure down .  If you have any questions, please call the office at [phone]  Prescriptions given: 1. Percocet 5-325, 1 tablet q6h prn. #10, no refill  Disposition: home  Patient's condition: is Good  Follow up: 1. Dr. Donzetta Matters in 4 weeks with IVC duplex   Leontine Locket, PA-C [facility] [phone] 01/13/2019  9:55 AM

## 2019-01-13 NOTE — Progress Notes (Signed)
  Progress Note    01/13/2019 10:55 AM 1 Day Post-Op  Subjective: Has minimal discomfort in left Elvis  Vitals:   01/13/19 0722 01/13/19 0848  BP: 106/61   Pulse: (!) 56   Resp: 13   Temp:  97.7 F (36.5 C)  SpO2: 97%     Physical Exam: Awake alert oriented Abdomen is soft Left groin soft without hematoma Left lower extremity without swelling  CBC    Component Value Date/Time   WBC 6.1 01/12/2019 1009   RBC 4.10 01/12/2019 1009   HGB 12.2 01/12/2019 1009   HCT 38.2 01/12/2019 1009   PLT 179 01/12/2019 1009   MCV 93.2 01/12/2019 1009   MCH 29.8 01/12/2019 1009   MCHC 31.9 01/12/2019 1009   RDW 12.6 01/12/2019 1009   LYMPHSABS 2.1 04/09/2018 0155   MONOABS 1.7 (H) 04/09/2018 0155   EOSABS 0.1 04/09/2018 0155   BASOSABS 0.0 04/09/2018 0155    BMET    Component Value Date/Time   NA 139 01/13/2019 0213   K 4.0 01/13/2019 0213   CL 109 01/13/2019 0213   CO2 22 01/13/2019 0213   GLUCOSE 90 01/13/2019 0213   BUN 12 01/13/2019 0213   CREATININE 0.82 01/13/2019 0213   CALCIUM 8.8 (L) 01/13/2019 0213   GFRNONAA >60 01/13/2019 0213   GFRAA >60 01/13/2019 0213    INR No results found for: INR   Intake/Output Summary (Last 24 hours) at 01/13/2019 1055 Last data filed at 01/13/2019 4097 Gross per 24 hour  Intake 1180 ml  Output -  Net 1180 ml     Assessment:  43 y.o. 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  Plan: She will continue Eliquis for at least 3 months.  Aspirin will be lifelong.  Activity as tolerated.  She will follow-up in 1 month with IVC iliac duplex unless she has issues before   Krystian Ferrentino C. Donzetta Matters, MD [facility] [place] Office: [phone] Pager: [phone]  01/13/2019 10:55 AM

## 2019-01-13 NOTE — Discharge Instructions (Signed)
° °  [facility]  Discharge Instructions  Lower Extremity Angiogram; Angioplasty/Stenting  Please refer to the following instructions for your post-procedure care. Your surgeon or physician assistant will discuss any changes with you.  Activity  Avoid lifting more than 8 pounds (1 gallons of milk) for 72 hours (3 days) after your procedure. You may walk as much as you can tolerate. It's OK to drive after 72 hours.  Wear compression socks daily. Elevate legs when not walking.  Bathing/Showering  You may shower the day after your procedure. If you have a bandage, you may remove it at 24- 48 hours. Clean your incision site with mild soap and water. Pat the area dry with a clean towel.  Diet  Resume your pre-procedure diet. There are no special food restrictions following this procedure. All patients with peripheral vascular disease should follow a low fat/low cholesterol diet. In order to heal from your surgery, it is CRITICAL to get adequate nutrition. Your body requires vitamins, minerals, and protein. Vegetables are the best source of vitamins and minerals. Vegetables also provide the perfect balance of protein. Processed food has little nutritional value, so try to avoid this.  Medications  Resume taking all of your medications unless your doctor tells you not to. If your incision is causing pain, you may take over-the-counter pain relievers such as acetaminophen (Tylenol)  Follow Up  Follow up will be arranged at the time of your procedure. You may have an office visit scheduled or may be scheduled for surgery. Ask your surgeon if you have any questions.  Please call us immediately for any of the following conditions: Severe or worsening pain your legs or feet at rest or with walking. Increased pain, redness, drainage at your groin puncture site. Fever of 101 degrees or higher. If you have any mild or slow bleeding from your puncture site: lie  down, apply firm constant pressure over the area with a piece of gauze or a clean wash cloth for 30 minutes- no peeking!, call 911 right away if you are still bleeding after 30 minutes, or if the bleeding is heavy and unmanageable.  Reduce your risk factors of vascular disease:   Stop smoking. If you would like help call [facility] at 1-800-QUIT-NOW 605-872-6795) or [place] at 501-144-8771.  Manage your cholesterol  Maintain a desired weight  Control your diabetes  Keep your blood pressure down   If you have any questions, please call the office at [phone]

## 2019-01-13 NOTE — Progress Notes (Signed)
Patient in a stable condition, discharge education reviewed with patient and her husband at bedside, they verbalized understanding, iv removed , tele dc ccmd notified, home meds returned to patient, patient belongings at bedside, patient to be transported home by her husband.

## 2019-01-14 ENCOUNTER — Ambulatory Visit: Payer: Managed Care, Other (non HMO)

## 2019-01-16 ENCOUNTER — Telehealth: Payer: Self-pay | Admitting: Vascular Surgery

## 2019-01-16 NOTE — Telephone Encounter (Signed)
sch appt spk to pt app in mychart per pt 02/13/2019 8am IVC iliac 9am p/o MD

## 2019-01-16 NOTE — Telephone Encounter (Signed)
-----   Message from Gabriel Earing, [place] sent at 01/13/2019  9:53 AM EST ----- S/P stenting left iliac vein 2/17. Follow up 1 month with Dr. Donzetta Matters with IVC duplex LEFT.

## 2019-02-11 ENCOUNTER — Other Ambulatory Visit: Payer: Self-pay

## 2019-02-11 DIAGNOSIS — I871 Compression of vein: Secondary | ICD-10-CM

## 2019-02-12 ENCOUNTER — Telehealth: Payer: Self-pay | Admitting: Vascular Surgery

## 2019-02-12 NOTE — Telephone Encounter (Signed)
Called patient and LMOM to call the office we need to R/S her appt for DOS 02/12/19

## 2019-02-13 ENCOUNTER — Ambulatory Visit (HOSPITAL_COMMUNITY): Payer: Managed Care, Other (non HMO)

## 2019-02-13 ENCOUNTER — Telehealth: Payer: Self-pay | Admitting: Internal Medicine

## 2019-02-13 ENCOUNTER — Encounter: Payer: Managed Care, Other (non HMO) | Admitting: Vascular Surgery

## 2019-02-13 NOTE — Telephone Encounter (Signed)
Unable to reach LMTCB 

## 2019-02-16 ENCOUNTER — Other Ambulatory Visit: Payer: Self-pay

## 2019-02-16 ENCOUNTER — Ambulatory Visit: Payer: Managed Care, Other (non HMO) | Admitting: Internal Medicine

## 2019-02-16 MED ORDER — BUDESONIDE-FORMOTEROL FUMARATE 160-4.5 MCG/ACT IN AERO: INHALATION_SPRAY | RESPIRATORY_TRACT | 6 refills | Status: DC

## 2019-02-16 NOTE — Telephone Encounter (Signed)
Patient called had appt with CY today, rescheduled for 05/23/19 In need of symbicort 160 refilled; placed order today with 6 refills Pt expressed and verbalized understanding Sent symb 160 to [facility] today Nothing further needed.

## 2019-02-16 NOTE — Telephone Encounter (Signed)
LVM for the patient to advise that asthma does not increase her chance of getting COVID 19, but her being around people who have it does.   Risk increases if she is not engaging in social distancing, not washing hands, not covering her mouth when she coughs.   Also advised in message that she only go out of the home when necessary and to limit visitors to the home and communicate by phone with everyone to reduce her risk.   Advised that asthma may make it harder for her to fight the virus if she contracted it, but she can also check [email or web address] for additional information.

## 2019-04-23 ENCOUNTER — Telehealth (HOSPITAL_COMMUNITY): Payer: Self-pay

## 2019-04-23 NOTE — Telephone Encounter (Signed)
The above patient or their representative was contacted and gave the following answers to these questions:         Do you have any of the following symptoms?  nNO  Fever                    Cough                   Shortness of breath  Do  you have any of the following other symptoms?    muscle pain         vomiting,        diarrhea        rash         weakness        red eye        abdominal pain         bruising          bruising or bleeding              joint pain           severe headache    Have you been in contact with someone who was or has been sick in the past 2 weeks?  NO  Yes                 Unsure                         Unable to assess   Does the person that you were in contact with have any of the following symptoms?   Cough         shortness of breath           muscle pain         vomiting,            diarrhea            rash            weakness           fever            red eye           abdominal pain           bruising  or  bleeding                joint pain                severe headache               Have you  or someone you have been in contact with traveled internationally in th last month?  NO       If yes, which countries?   Have you  or someone you have been in contact with traveled outside [place] in th last month?  NO       If yes, which state and city?   COMMENTS OR ACTION PLAN FOR THIS PATIENT:

## 2019-04-24 ENCOUNTER — Encounter: Payer: Self-pay | Admitting: Vascular Surgery

## 2019-04-24 ENCOUNTER — Ambulatory Visit (INDEPENDENT_AMBULATORY_CARE_PROVIDER_SITE_OTHER): Payer: Managed Care, Other (non HMO) | Admitting: Vascular Surgery

## 2019-04-24 ENCOUNTER — Other Ambulatory Visit: Payer: Self-pay

## 2019-04-24 ENCOUNTER — Ambulatory Visit (HOSPITAL_COMMUNITY)
Admission: RE | Admit: 2019-04-24 | Discharge: 2019-04-24 | Disposition: A | Discharge status: Home / Self Care | Payer: Managed Care, Other (non HMO) | Source: Ambulatory Visit | Attending: Vascular Surgery | Admitting: Vascular Surgery

## 2019-04-24 VITALS — BP 106/70 | HR 65 | Temp 97.6°F | Resp 18 | Ht 64.0 in | Wt 139.7 lb

## 2019-04-24 DIAGNOSIS — I871 Compression of vein: Secondary | ICD-10-CM

## 2019-04-24 NOTE — Progress Notes (Signed)
Patient ID: Sierra Olsen, female   DOB: 1976/10/25, 43 y.o.   MRN: [id number]  Reason for Consult: No chief complaint on file.   Referred by Marda Stalker, PA-C  Subjective:     HPI:  Sierra Olsen is a 43 y.o. female status post stenting for [name].  States that she has no swelling at this time.  Overall feeling well.  Does have occasional left lower back pain.  Pelvic pain resolved in 2 weeks.  She remains on Eliquis but is about to stop.  Had to stop aspirin with the Eliquis secondary to her gums bleeding.  She is back to full activity but is not working secondary to novel coronavirus.  Past Medical History:  Diagnosis Date  . Allergy   . Anxiety   . Anxiety   . Asthma   . Celiac disease   . Celiac disease   . DVT (deep venous thrombosis) (HCC)    Of iliac vein of LLE  . DVT (deep venous thrombosis) ([place]) 01/12/2019  . Endometriosis   . Endometriosis   . Environmental allergies   . Epigastric pain   . Family history of adverse reaction to anesthesia    SISTER HAD TROUBLE BREATHING  . GERD (gastroesophageal reflux disease)   . Headache   . Hx of migraine headaches   . Hx of migraine headaches   . Hypoglycemia   . Hypoglycemia   . Leukocytosis   . Low back pain   . Nausea   . PE (pulmonary thromboembolism) ([place]) 03/2018  . PE (pulmonary thromboembolism) ([place])   . PVC (premature ventricular contraction)   . Retinal edema   . Retinal edema   . RUQ abdominal pain   . Tinnitus   . Tinnitus    Family History  Problem Relation Age of Onset  . Asthma Mother   . Emphysema Maternal Grandmother        smoker  . Emphysema Maternal Grandfather   . Colon cancer Paternal Grandmother   . Heart disease Paternal Grandfather   . Heart attack Paternal Grandfather   . Allergies Other        both sides of family  . Asthma Brother   . Breast cancer Other        father side  . Migraines Sister   . Endometriosis Sister   . Allergies Sister   . Allergies  Brother    Past Surgical History:  Procedure Laterality Date  . DILATION AND CURETTAGE OF UTERUS    . INTRAVASCULAR ULTRASOUND/IVUS N/A 01/12/2019   Procedure: INTRAVASCULAR ULTRASOUND/IVUS;  Surgeon: Waynetta Sandy, MD;  Location: [facility];  Service: Cardiovascular;  Laterality: N/A;  . LOWER EXTREMITY VENOGRAPHY Left 01/12/2019   Procedure: LOWER EXTREMITY VENOGRAPHY;  Surgeon: Waynetta Sandy, MD;  Location: [facility];  Service: Cardiovascular;  Laterality: Left;  . PERIPHERAL VASCULAR INTERVENTION Left 01/12/2019   Procedure: PERIPHERAL VASCULAR INTERVENTION;  Surgeon: Waynetta Sandy, MD;  Location: [facility];  Service: Cardiovascular;  Laterality: Left;  common and external iliac venous Ivus and stent  . tubes in ears     age 74  . WISDOM TOOTH EXTRACTION      Short Social History:  Social History   Tobacco Use  . Smoking status: Former Smoker    Packs/day: 1.00    Years: 5.00    Pack years: 5.00    Types: Cigarettes    Last attempt to quit: 11/26/2001  Years since quitting: 17.4  . Smokeless tobacco: Never Used  Substance Use Topics  . Alcohol use: Yes    Alcohol/week: 7.0 standard drinks    Types: 7 Glasses of wine per week    Comment: weekly    Allergies  Allergen Reactions  . Contrast Media [Iodinated Diagnostic Agents]     wheezing  . Gluten Meal Other (See Comments)    celiac disease    Current Outpatient Medications  Medication Sig Dispense Refill  . acetaminophen (TYLENOL) 325 MG tablet Take 325 mg by mouth every 6 (six) hours as needed (for pain.).    Marland Kitchen albuterol (PROAIR HFA) 108 (90 Base) MCG/ACT inhaler 2 puffs every 6 hours as needed (Patient taking differently: Inhale 2 puffs into the lungs every 6 (six) hours as needed for wheezing. ) 3 Inhaler 3  . ALPRAZolam (XANAX) 0.25 MG tablet Take 0.125 mg by mouth 2 (two) times daily as needed for anxiety.     Marland Kitchen apixaban (ELIQUIS) 5 MG TABS tablet Take 1  tablet (5 mg total) by mouth 2 (two) times daily. 180 tablet 2  . budesonide-formoterol (SYMBICORT) 160-4.5 MCG/ACT inhaler Inhale 2 puffs then rinse mouth, twice daily 3 Inhaler 6  . mometasone (NASONEX) 50 MCG/ACT nasal spray Place 2 sprays into the nose daily as needed (allergies).     . Multiple Vitamin (MULTIVITAMIN) tablet Take 1 tablet by mouth daily.    Marland Kitchen oxyCODONE-acetaminophen (PERCOCET) 5-325 MG tablet Take 1 tablet by mouth every 6 (six) hours as needed for severe pain. 10 tablet 0  . Spacer/Aero-Holding Chambers (AEROCHAMBER MV) inhaler Use as instructed 1 each 0   No current facility-administered medications for this visit.     Review of Systems  Constitutional:  Constitutional negative. HENT: HENT negative.  Eyes: Eyes negative.  Respiratory: Respiratory negative.  Cardiovascular: Cardiovascular negative.  GI: Gastrointestinal negative.  Musculoskeletal: Positive for back pain.  Skin: Skin negative.  Neurological: Neurological negative. Hematologic: Hematologic/lymphatic negative.  Psychiatric: Psychiatric negative.        Objective:   Vitals:   04/24/19 0835  BP: 106/70  Pulse: 65  Resp: 18  Temp: 97.6 F (36.4 C)  SpO2: 96%    Physical Exam Constitutional:      Appearance: Normal appearance.  HENT:     Head: Normocephalic.     Nose: Nose normal.     Mouth/Throat:     Mouth: Mucous membranes are moist.  Eyes:     Pupils: Pupils are equal, round, and reactive to light.  Cardiovascular:     Rate and Rhythm: Normal rate.     Pulses:          Radial pulses are 2+ on the right side and 2+ on the left side.       Dorsalis pedis pulses are 2+ on the right side and 2+ on the left side.       Posterior tibial pulses are 2+ on the right side and 2+ on the left side.  Pulmonary:     Effort: Pulmonary effort is normal.  Abdominal:     General: Abdomen is flat.     Palpations: Abdomen is soft. There is no mass.  Musculoskeletal:     Right lower leg: No  edema.     Left lower leg: No edema.  Skin:    General: Skin is warm and dry.     Capillary Refill: Capillary refill takes less than 2 seconds.  Neurological:     Mental Status: She  is alert.  Psychiatric:        Mood and Affect: Mood normal.        Behavior: Behavior normal.        Thought Content: Thought content normal.        Judgment: Judgment normal.     Data: I have independently interpreted her IVC iliac duplex which demonstrates patent common and external iliac vein stents on the left.     Assessment/Plan:     43 year old female status post tenting for [name].  She is doing well now.  She will stop Eliquis and resume on aspirin.  Okay for activity as tolerated.  I have cautioned her to remain hydrated and have intermittent activity when traveling long distances particularly since she did have a trip planned to the [place] and will likely have this trip sometime next year after the virus has passed.  She will follow-up in 6 months with repeat IVC iliac duplex.     Waynetta Sandy MD [facility]

## 2019-05-25 ENCOUNTER — Encounter: Payer: Self-pay | Admitting: Internal Medicine

## 2019-05-25 ENCOUNTER — Other Ambulatory Visit: Payer: Self-pay

## 2019-05-25 ENCOUNTER — Ambulatory Visit (INDEPENDENT_AMBULATORY_CARE_PROVIDER_SITE_OTHER): Payer: Managed Care, Other (non HMO) | Admitting: Internal Medicine

## 2019-05-25 DIAGNOSIS — J4520 Mild intermittent asthma, uncomplicated: Secondary | ICD-10-CM | POA: Diagnosis not present

## 2019-05-25 DIAGNOSIS — I2699 Other pulmonary embolism without acute cor pulmonale: Secondary | ICD-10-CM

## 2019-05-25 NOTE — Assessment & Plan Note (Signed)
Continues to be well controlled on maintenance Symbicort with rare need for rescue inhaler No changes needed.

## 2019-05-25 NOTE — Progress Notes (Signed)
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 Had tubes in ears, otherwise no ENT surgery.  Celiac Positive- avoids gluten.Denies GERD Office Spirometry 02/10/18-WNL-FEV1 3.70/100%, FEV1 3.13/105%, ratio 1.85, FEF 25-75% 3.76/122%  -----------------------------------------------------------------------------------   02/10/18- 43 year old former smoker followed for Asthma, allergic rhinitis, complicated by celiac disease, endometriosis Feels stable. Occ forgets evening dose of Symbicort. Symbicort 160, Proair,  Has not been needing rescue inhaler at all.  No sleep disturbance.  Replaced carpet upstairs with hardwood and thinks that made a significant difference.  Much more stable.  Occasional palpitation relieved by walking-cardiology has seen..  We discussed availability of LAMA inhalers, Singulair and other approaches if asthma control conflicts with palpitation.  Spring pollen rhinitis has not yet flared. Office Spirometry 02/10/18-WNL-FEV1 3.70/100%, FEV1 3.13/105%, ratio 1.85, FEF 25-75% 3.76/122%  05/25/2019- 26 year old former smoker followed for Asthma, allergic rhinitis, complicated by celiac disease, endometriosis, PAD/ [name], DVT/ PE 04/07/18/ Eliquis Symbicort 160, Proair,  -----pt states asthma is at baseline, reports scar from pulmonary embolism in March 2019; takes Symbicort and states it is therapeutic for her About to come off Eliquis to lifelong aspirin. No hx ASA intolerance.  Asthma easily controlled w Symbicort. Rare need for rescue inhaler.  Discussed Covid risk.   ROS-see HPI  + = positive Constitutional:   No-   weight loss, night sweats, fevers, chills, fatigue, lassitude. HEENT:   No-  headaches, difficulty swallowing, tooth/dental problems, sore throat,       No-  sneezing, itching, ear ache, nasal  congestion, post nasal drip,  CV:   chest pain, orthopnea, PND, swelling in lower extremities, anasarca,                                                                   dizziness, +palpitations Resp: + shortness of breath with exertion or at rest.               productive cough,  + non-productive cough,  No- coughing up of blood.              No-   change in color of mucus.  + wheezing.   Skin: No-   rash or lesions. GI:  No-   heartburn, indigestion, abdominal pain, nausea, vomiting,  GU:  MS:  No-   joint pain or swelling.   Neuro-     nothing unusual Psych:  No- change in mood or affect. No depression or anxiety.  No memory loss.  OBJ- Physical Exam General- Alert, Oriented, Affect-appropriate, Distress- none acute, well appearing Skin- rash-none, lesions- none, excoriation- none Lymphadenopathy- none Head- atraumatic            Eyes- Gross vision intact, PERRLA, conjunctivae and secretions clear            Ears- Hearing, canals-normal            Nose-  clear, no-Septal dev, mucus, polyps, erosion, perforation             Throat- Mallampati II , mucosa clear , drainage- none, tonsils- atrophic Neck- flexible , trachea midline, no stridor , thyroid nl, carotid no bruit Chest - symmetrical  excursion , unlabored           Heart/CV- RRR , no murmur , no gallop  , no rub, nl s1 s2                           - JVD- none , edema- none, stasis changes- none, varices- none           Lung- clear to P&A, wheeze- none, cough- none , dullness-none, rub- none           Chest wall-  Abd-  Br/ Gen/ Rectal- Not done, not indicated Extrem- cyanosis- none, clubbing, none, atrophy- none, strength- nl Neuro- grossly intact to observation

## 2019-05-25 NOTE — Patient Instructions (Signed)
Ok to continue Symbicort, and to use the rescue inhaler if needed  Please call if we can help

## 2019-05-25 NOTE — Assessment & Plan Note (Signed)
VTE being managed elsewhere, about to switch from Eliquis to long-term aspirin. She as not had past reactions to aspirin.

## 2019-11-12 ENCOUNTER — Other Ambulatory Visit: Payer: Self-pay

## 2019-11-12 DIAGNOSIS — I871 Compression of vein: Secondary | ICD-10-CM

## 2019-11-13 ENCOUNTER — Other Ambulatory Visit: Payer: Self-pay

## 2019-11-13 ENCOUNTER — Ambulatory Visit (HOSPITAL_COMMUNITY)
Admission: RE | Admit: 2019-11-13 | Discharge: 2019-11-13 | Disposition: A | Discharge status: Home / Self Care | Payer: Managed Care, Other (non HMO) | Source: Ambulatory Visit | Attending: Vascular Surgery | Admitting: Vascular Surgery

## 2019-11-13 ENCOUNTER — Ambulatory Visit (INDEPENDENT_AMBULATORY_CARE_PROVIDER_SITE_OTHER): Payer: Managed Care, Other (non HMO) | Admitting: Vascular Surgery

## 2019-11-13 ENCOUNTER — Encounter: Payer: Self-pay | Admitting: Vascular Surgery

## 2019-11-13 VITALS — BP 101/65 | HR 59 | Temp 97.6°F | Resp 20 | Ht 64.0 in | Wt 138.0 lb

## 2019-11-13 DIAGNOSIS — I871 Compression of vein: Secondary | ICD-10-CM | POA: Diagnosis not present

## 2019-11-13 NOTE — Progress Notes (Signed)
Patient ID: Sierra Olsen, female   DOB: 1976-01-25, 43 y.o.   MRN: [id number]  Reason for Consult: Follow-up   Referred by Marda Stalker, PA-C  Subjective:     HPI:  Sierra Olsen is a 43 y.o. female status post stenting for May Thurner.  Previously had DVT with PE.  Now is off Eliquis taking aspirin only.  States systemically she feels better.  She has been active she is actually been running does have a little pain on her medial thigh but otherwise no varicosities.  Swelling is minimal.  Overall she is progressing well.  Past Medical History:  Diagnosis Date  . Allergy   . Anxiety   . Anxiety   . Asthma   . Celiac disease   . Celiac disease   . DVT (deep venous thrombosis) (HCC)    Of iliac vein of LLE  . DVT (deep venous thrombosis) ([place]) 01/12/2019  . Endometriosis   . Endometriosis   . Environmental allergies   . Epigastric pain   . Family history of adverse reaction to anesthesia    SISTER HAD TROUBLE BREATHING  . GERD (gastroesophageal reflux disease)   . Headache   . Hx of migraine headaches   . Hx of migraine headaches   . Hypoglycemia   . Hypoglycemia   . Leukocytosis   . Low back pain   . Nausea   . PE (pulmonary thromboembolism) ([place]) 03/2018  . PE (pulmonary thromboembolism) ([place])   . PVC (premature ventricular contraction)   . Retinal edema   . Retinal edema   . RUQ abdominal pain   . Tinnitus   . Tinnitus    Family History  Problem Relation Age of Onset  . Asthma Mother   . Emphysema Maternal Grandmother        smoker  . Emphysema Maternal Grandfather   . Colon cancer Paternal Grandmother   . Heart disease Paternal Grandfather   . Heart attack Paternal Grandfather   . Allergies Other        both sides of family  . Asthma Brother   . Breast cancer Other        father side  . Migraines Sister   . Endometriosis Sister   . Allergies Sister   . Allergies Brother    Past Surgical History:  Procedure Laterality Date  . DILATION AND  CURETTAGE OF UTERUS    . INTRAVASCULAR ULTRASOUND/IVUS N/A 01/12/2019   Procedure: INTRAVASCULAR ULTRASOUND/IVUS;  Surgeon: Waynetta Sandy, MD;  Location: [facility];  Service: Cardiovascular;  Laterality: N/A;  . LOWER EXTREMITY VENOGRAPHY Left 01/12/2019   Procedure: LOWER EXTREMITY VENOGRAPHY;  Surgeon: Waynetta Sandy, MD;  Location: [facility];  Service: Cardiovascular;  Laterality: Left;  . PERIPHERAL VASCULAR INTERVENTION Left 01/12/2019   Procedure: PERIPHERAL VASCULAR INTERVENTION;  Surgeon: Waynetta Sandy, MD;  Location: [facility];  Service: Cardiovascular;  Laterality: Left;  common and external iliac venous Ivus and stent  . tubes in ears     age 89  . WISDOM TOOTH EXTRACTION      Short Social History:  Social History   Tobacco Use  . Smoking status: Former Smoker    Packs/day: 1.00    Years: 5.00    Pack years: 5.00    Types: Cigarettes    Quit date: 11/26/2001    Years since quitting: 17.9  . Smokeless tobacco: Never Used  Substance Use Topics  . Alcohol use: Yes  Alcohol/week: 7.0 standard drinks    Types: 7 Glasses of wine per week    Comment: weekly    Allergies  Allergen Reactions  . Contrast Media [Iodinated Diagnostic Agents]     wheezing  . Gluten Meal Other (See Comments)    celiac disease    Current Outpatient Medications  Medication Sig Dispense Refill  . aspirin 325 MG tablet Take 325 mg by mouth daily.    Marland Kitchen acetaminophen (TYLENOL) 325 MG tablet Take 325 mg by mouth every 6 (six) hours as needed (for pain.).    Marland Kitchen albuterol (PROAIR HFA) 108 (90 Base) MCG/ACT inhaler 2 puffs every 6 hours as needed (Patient taking differently: Inhale 2 puffs into the lungs every 6 (six) hours as needed for wheezing. ) 3 Inhaler 3  . ALPRAZolam (XANAX) 0.25 MG tablet Take 0.125 mg by mouth 2 (two) times daily as needed for anxiety.     . budesonide-formoterol (SYMBICORT) 160-4.5 MCG/ACT inhaler Inhale 2 puffs then  rinse mouth, twice daily 3 Inhaler 6  . mometasone (NASONEX) 50 MCG/ACT nasal spray Place 2 sprays into the nose daily as needed (allergies).     Marland Kitchen Spacer/Aero-Holding Chambers (AEROCHAMBER MV) inhaler Use as instructed (Patient not taking: Reported on 05/25/2019) 1 each 0   No current facility-administered medications for this visit.    Review of Systems  Constitutional:  Constitutional negative. HENT: HENT negative.  Eyes: Eyes negative.  Respiratory: Respiratory negative.  Cardiovascular: Cardiovascular negative.  GI: Gastrointestinal negative.  Musculoskeletal: Positive for leg pain.  Skin: Skin negative.  Neurological: Neurological negative. Hematologic: Hematologic/lymphatic negative.  Psychiatric: Psychiatric negative.        Objective:  Objective   Vitals:   11/13/19 0830  BP: 101/65  Pulse: (!) 59  Resp: 20  Temp: 97.6 F (36.4 C)  SpO2: 96%  Weight: 138 lb (62.6 kg)  Height: 5' 4"  (1.626 m)   Body mass index is 23.69 kg/m.  Physical Exam HENT:     Head: Normocephalic.     Mouth/Throat:     Mouth: Mucous membranes are moist.  Eyes:     Pupils: Pupils are equal, round, and reactive to light.  Cardiovascular:     Rate and Rhythm: Normal rate.     Pulses:          Radial pulses are 2+ on the right side and 2+ on the left side.       Dorsalis pedis pulses are 2+ on the right side and 2+ on the left side.       Posterior tibial pulses are 2+ on the right side and 2+ on the left side.  Abdominal:     General: Abdomen is flat.     Palpations: Abdomen is soft. There is no mass.  Musculoskeletal:        General: No swelling.  Skin:    General: Skin is warm and dry.  Neurological:     General: No focal deficit present.     Mental Status: She is alert.  Psychiatric:        Mood and Affect: Mood normal.     Data: I have independently turbid her left sided iliac and IVC study which demonstrates patent common iliac and external leg vein stents.       Assessment/Plan:     43 year old female status post stenting of left common external leg veins for [name].  Now doing very well.  On aspirin only.  We will follow-up in 1 more  year with repeat IVC iliac duplex.  We have discussed the signs symptoms of stent malfunction she demonstrates good understanding we will see her in another year.     Waynetta Sandy MD [facility]

## 2019-11-30 ENCOUNTER — Other Ambulatory Visit: Payer: Self-pay | Admitting: *Deleted

## 2019-11-30 DIAGNOSIS — I871 Compression of vein: Secondary | ICD-10-CM

## 2020-02-12 ENCOUNTER — Telehealth: Payer: Self-pay | Admitting: Internal Medicine

## 2020-02-12 MED ORDER — ALBUTEROL SULFATE HFA 108 (90 BASE) MCG/ACT IN AERS: INHALATION_SPRAY | RESPIRATORY_TRACT | 5 refills | Status: DC

## 2020-02-12 NOTE — Telephone Encounter (Signed)
rx for albuterol sent to preferred pharmacy.  Pt aware.  Nothing further needed at this time- will close encounter.

## 2020-05-10 ENCOUNTER — Other Ambulatory Visit: Payer: Self-pay | Admitting: Vascular Surgery

## 2020-05-10 MED ORDER — CLOPIDOGREL BISULFATE 75 MG PO TABS: 75.0000 mg | ORAL_TABLET | Freq: Every day | ORAL | 6 refills | Status: DC

## 2020-05-24 ENCOUNTER — Ambulatory Visit: Payer: Managed Care, Other (non HMO) | Admitting: Internal Medicine

## 2020-06-24 ENCOUNTER — Encounter: Payer: Self-pay | Admitting: Internal Medicine

## 2020-06-24 ENCOUNTER — Ambulatory Visit (INDEPENDENT_AMBULATORY_CARE_PROVIDER_SITE_OTHER): Payer: Managed Care, Other (non HMO) | Admitting: Internal Medicine

## 2020-06-24 ENCOUNTER — Other Ambulatory Visit: Payer: Self-pay

## 2020-06-24 DIAGNOSIS — J302 Other seasonal allergic rhinitis: Secondary | ICD-10-CM | POA: Diagnosis not present

## 2020-06-24 DIAGNOSIS — J4520 Mild intermittent asthma, uncomplicated: Secondary | ICD-10-CM | POA: Diagnosis not present

## 2020-06-24 DIAGNOSIS — J3089 Other allergic rhinitis: Secondary | ICD-10-CM | POA: Diagnosis not present

## 2020-06-24 MED ORDER — BUDESONIDE-FORMOTEROL FUMARATE 160-4.5 MCG/ACT IN AERO: INHALATION_SPRAY | RESPIRATORY_TRACT | 4 refills | Status: DC

## 2020-06-24 NOTE — Progress Notes (Signed)
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 Had tubes in ears, otherwise no ENT surgery.  Celiac Positive- avoids gluten.Denies GERD Office Spirometry 02/10/18-WNL-FEV1 3.70/100%, FEV1 3.13/105%, ratio 1.85, FEF 25-75% 3.76/122%  -----------------------------------------------------------------------------------    05/25/2019- 44 year old former smoker followed for Asthma, allergic rhinitis, complicated by celiac disease, endometriosis, PAD/ [name], DVT/ PE 04/07/18/ Eliquis Symbicort 160, Proair,  -----pt states asthma is at baseline, reports scar from pulmonary embolism in March 2019; takes Symbicort and states it is therapeutic for her About to come off Eliquis to lifelong aspirin. No hx ASA intolerance.  Asthma easily controlled w Symbicort. Rare need for rescue inhaler.  Discussed Covid risk.   06/24/20- 72 year old former smoker followed for Asthma, allergic rhinitis, Aspirin Allergy, complicated by celiac disease, endometriosis, PAD/ [name], DVT/ PE 04/07/18/ Plavix Symbicort 160, Proair,  -----yearly follow up Had 2 Phizer Covax Aspirin triggered allergic conjunctivitis. Asthma associated with allergy to dogs, cats, horses, carpet9 dust). Little recent need for rescue inhaler. Symbicort 160, Proair hfa, Nasonex,    ROS-see HPI  + = positive Constitutional:   No-   weight loss, night sweats, fevers, chills, fatigue, lassitude. HEENT:   No-  headaches, difficulty swallowing, tooth/dental problems, sore throat,       No-  sneezing, itching, ear ache, nasal congestion, post nasal drip,  CV:   chest pain, orthopnea, PND, swelling in lower extremities, anasarca,                                                                   dizziness, +palpitations Resp: + shortness of breath with exertion or at rest.                productive cough,  + non-productive cough,  No- coughing up of blood.              No-   change in color of mucus.  + wheezing.   Skin: No-   rash or lesions. GI:  No-   heartburn, indigestion, abdominal pain, nausea, vomiting,  GU:  MS:  No-   joint pain or swelling.   Neuro-     nothing unusual Psych:  No- change in mood or affect. No depression or anxiety.  No memory loss.  OBJ- Physical Exam General- Alert, Oriented, Affect-appropriate, Distress- none acute, well appearing Skin- rash-none, lesions- none, excoriation- none Lymphadenopathy- none Head- atraumatic            Eyes- Gross vision intact, PERRLA, conjunctivae and secretions clear            Ears- Hearing, canals-normal            Nose-  clear, no-Septal dev, mucus, polyps, erosion, perforation             Throat- Mallampati II , mucosa clear , drainage- none, tonsils- atrophic Neck- flexible , trachea midline, no stridor , thyroid nl, carotid no bruit Chest - symmetrical excursion , unlabored           Heart/CV- RRR , no murmur , no gallop  , no rub, nl s1 s2                           -  JVD- none , edema- none, stasis changes- none, varices- none           Lung- clear to P&A, wheeze- none, cough- none , dullness-none, rub- none           Chest wall-  Abd-  Br/ Gen/ Rectal- Not done, not indicated Extrem- cyanosis- none, clubbing, none, atrophy- none, strength- nl Neuro- grossly intact to observation

## 2020-06-24 NOTE — Patient Instructions (Signed)
Refill sent for Symbicort  Please call if we can help

## 2020-07-06 ENCOUNTER — Encounter: Payer: Self-pay | Admitting: Internal Medicine

## 2020-07-06 NOTE — Assessment & Plan Note (Signed)
Mild uncomplicated Plan- refill Symbicort

## 2020-07-06 NOTE — Assessment & Plan Note (Signed)
Known atopy. Also note report of aspirin induced conjunctivitis- allergic Plan- avoid aspirin

## 2020-07-09 ENCOUNTER — Other Ambulatory Visit: Payer: Self-pay | Admitting: Vascular Surgery

## 2020-09-02 ENCOUNTER — Other Ambulatory Visit: Payer: Self-pay | Admitting: Family Medicine

## 2020-09-02 DIAGNOSIS — Z1231 Encounter for screening mammogram for malignant neoplasm of breast: Secondary | ICD-10-CM

## 2020-09-27 ENCOUNTER — Ambulatory Visit
Admission: RE | Admit: 2020-09-27 | Discharge: 2020-09-27 | Disposition: A | Discharge status: Home / Self Care | Payer: Managed Care, Other (non HMO) | Source: Ambulatory Visit | Attending: Family Medicine | Admitting: Family Medicine

## 2020-09-27 ENCOUNTER — Other Ambulatory Visit: Payer: Self-pay

## 2020-09-27 DIAGNOSIS — Z1231 Encounter for screening mammogram for malignant neoplasm of breast: Secondary | ICD-10-CM

## 2021-07-30 ENCOUNTER — Other Ambulatory Visit: Payer: Self-pay | Admitting: Vascular Surgery

## 2021-08-20 ENCOUNTER — Encounter (HOSPITAL_BASED_OUTPATIENT_CLINIC_OR_DEPARTMENT_OTHER): Payer: Self-pay | Admitting: Emergency Medicine

## 2021-08-20 ENCOUNTER — Other Ambulatory Visit: Payer: Self-pay

## 2021-08-20 ENCOUNTER — Emergency Department (HOSPITAL_BASED_OUTPATIENT_CLINIC_OR_DEPARTMENT_OTHER)
Admission: EM | Admit: 2021-08-20 | Discharge: 2021-08-20 | Disposition: A | Discharge status: Home / Self Care | Payer: Managed Care, Other (non HMO) | Attending: Emergency Medicine | Admitting: Emergency Medicine

## 2021-08-20 DIAGNOSIS — R300 Dysuria: Secondary | ICD-10-CM | POA: Diagnosis present

## 2021-08-20 DIAGNOSIS — Z87891 Personal history of nicotine dependence: Secondary | ICD-10-CM | POA: Insufficient documentation

## 2021-08-20 DIAGNOSIS — J4520 Mild intermittent asthma, uncomplicated: Secondary | ICD-10-CM | POA: Insufficient documentation

## 2021-08-20 DIAGNOSIS — Z7951 Long term (current) use of inhaled steroids: Secondary | ICD-10-CM | POA: Diagnosis not present

## 2021-08-20 LAB — URINALYSIS, ROUTINE W REFLEX MICROSCOPIC
Bilirubin Urine: NEGATIVE
Glucose, UA: NEGATIVE mg/dL
Hgb urine dipstick: NEGATIVE
Ketones, ur: NEGATIVE mg/dL
Leukocytes,Ua: NEGATIVE
Nitrite: NEGATIVE
Protein, ur: NEGATIVE mg/dL
Specific Gravity, Urine: 1.027 (ref 1.005–1.030)
pH: 5.5 (ref 5.0–8.0)

## 2021-08-20 NOTE — ED Provider Notes (Signed)
[facility] Provider Note   CSN: [id number] Arrival date & time: 08/20/21  1753     History Chief Complaint  Patient presents with   Dysuria    Sierra Olsen is a 45 y.o. female.   Dysuria Associated symptoms: no abdominal pain, no fever, no flank pain, no nausea, no vaginal discharge and no vomiting   Patient presents for dysuria for the past 2 days.  Surgical history is notable for total laparoscopic hysterectomy, bilateral salpingectomy, uterosacral vaginal vault suspension.  She has sling procedure in July.  She reports that, since this procedure, she has had improved ability to urinate.  At times, she does have to stay on the toilet for minute to fully void.  She has had UTIs in the past.  She states that the last time was likely several years ago.  UTIs in the past have resolved with outpatient antibiotics.  She has no known allergies to antibiotics.  She denies any symptoms other than the dysuria.    Past Medical History:  Diagnosis Date   Allergy    Anxiety    Anxiety    Asthma    Celiac disease    Celiac disease    DVT (deep venous thrombosis) (HCC)    Of iliac vein of LLE   DVT (deep venous thrombosis) (HCC) 01/12/2019   Endometriosis    Endometriosis    Environmental allergies    Epigastric pain    Family history of adverse reaction to anesthesia    SISTER HAD TROUBLE BREATHING   GERD (gastroesophageal reflux disease)    Headache    Hx of migraine headaches    Hx of migraine headaches    Hypoglycemia    Hypoglycemia    Leukocytosis    Low back pain    Nausea    PE (pulmonary thromboembolism) ([place]) 03/2018   PE (pulmonary thromboembolism) (HCC)    PVC (premature ventricular contraction)    Retinal edema    Retinal edema    RUQ abdominal pain    Tinnitus    Tinnitus     Patient Active Problem List   Diagnosis Date Noted   DVT (deep venous thrombosis) ([place]) 01/12/2019   PE (pulmonary thromboembolism) ([place]) 04/08/2018    Endometriosis 04/07/2018   Chest pain of uncertain etiology 56/97/9480   Asthma, mild intermittent, well-controlled 03/24/2014   Seasonal and perennial allergic rhinitis 03/24/2014    Past Surgical History:  Procedure Laterality Date   ABDOMINAL HYSTERECTOMY     DILATION AND CURETTAGE OF UTERUS     INTRAVASCULAR ULTRASOUND/IVUS N/A 01/12/2019   Procedure: INTRAVASCULAR ULTRASOUND/IVUS;  Surgeon: Waynetta Sandy, MD;  Location: [facility];  Service: Cardiovascular;  Laterality: N/A;   LOWER EXTREMITY VENOGRAPHY Left 01/12/2019   Procedure: LOWER EXTREMITY VENOGRAPHY;  Surgeon: Waynetta Sandy, MD;  Location: [facility];  Service: Cardiovascular;  Laterality: Left;   PERIPHERAL VASCULAR INTERVENTION Left 01/12/2019   Procedure: PERIPHERAL VASCULAR INTERVENTION;  Surgeon: Waynetta Sandy, MD;  Location: [facility];  Service: Cardiovascular;  Laterality: Left;  common and external iliac venous Ivus and stent   tubes in ears     age 65   WISDOM TOOTH EXTRACTION       OB History   No obstetric history on file.     Family History  Problem Relation Age of Onset   Asthma Mother    Emphysema Maternal Grandmother        smoker   Emphysema  Maternal Grandfather    Colon cancer Paternal Grandmother    Heart disease Paternal Grandfather    Heart attack Paternal Grandfather    Allergies Other        both sides of family   Asthma Brother    Breast cancer Other        father side   Migraines Sister    Endometriosis Sister    Allergies Sister    Allergies Brother    Breast cancer Paternal Aunt     Social History   Tobacco Use   Smoking status: Former    Packs/day: 1.00    Years: 5.00    Pack years: 5.00    Types: Cigarettes    Quit date: 11/26/2001    Years since quitting: 19.7   Smokeless tobacco: Never  Vaping Use   Vaping Use: Never used  Substance Use Topics   Alcohol use: Yes    Alcohol/week: 7.0 standard drinks     Types: 7 Glasses of wine per week    Comment: weekly   Drug use: No    Home Medications Prior to Admission medications   Medication Sig Start Date End Date Taking? Authorizing Provider  acetaminophen (TYLENOL) 325 MG tablet Take 325 mg by mouth every 6 (six) hours as needed (for pain.).    [provider]  albuterol (PROAIR HFA) 108 (90 Base) MCG/ACT inhaler 2 puffs every 6 hours as needed 02/12/20   Baird Lyons D, MD  ALPRAZolam Duanne Moron) 0.25 MG tablet Take 0.125 mg by mouth 2 (two) times daily as needed for anxiety.     [provider]  budesonide-formoterol [facility]) 160-4.5 MCG/ACT inhaler Inhale 2 puffs then rinse mouth, twice daily 06/24/20   Baird Lyons D, MD  clopidogrel (PLAVIX) 75 MG tablet TAKE 1 TABLET BY MOUTH EVERY DAY 08/01/21   Waynetta Sandy, MD  mometasone (NASONEX) 50 MCG/ACT nasal spray Place 2 sprays into the nose daily as needed (allergies).     [provider]  Spacer/Aero-Holding Chambers (AEROCHAMBER MV) inhaler Use as instructed 05/10/14   Baird Lyons D, MD    Allergies    Aspirin, Contrast media [iodinated diagnostic agents], and Gluten meal  Review of Systems   Review of Systems  Constitutional:  Negative for appetite change, chills, fatigue and fever.  HENT:  Negative for ear pain and sore throat.   Eyes:  Negative for pain and visual disturbance.  Respiratory:  Negative for cough and shortness of breath.   Cardiovascular:  Negative for chest pain and palpitations.  Gastrointestinal:  Negative for abdominal pain, diarrhea, nausea and vomiting.  Genitourinary:  Positive for dysuria. Negative for difficulty urinating, flank pain, frequency, hematuria, pelvic pain, urgency, vaginal bleeding and vaginal discharge.  Musculoskeletal:  Negative for arthralgias, back pain, myalgias and neck pain.  Skin:  Negative for color change and rash.  Neurological:  Negative for dizziness, seizures, syncope, light-headedness and  headaches.  All other systems reviewed and are negative.  Physical Exam Updated Vital Signs BP 112/73 (BP Location: Left Arm)   Pulse (!) 57   Temp 98.6 F (37 C) (Oral)   Resp 16   Ht 5' 4"  (1.626 m)   Wt 68 kg   LMP 03/25/2017 (Approximate)   SpO2 100%   BMI 25.75 kg/m   Physical Exam Vitals and nursing note reviewed.  Constitutional:      General: She is not in acute distress.    Appearance: Normal appearance. She is well-developed and normal weight. She  is not ill-appearing, toxic-appearing or diaphoretic.  HENT:     Head: Normocephalic and atraumatic.     Right Ear: External ear normal.     Left Ear: External ear normal.     Nose: Nose normal.  Eyes:     Conjunctiva/sclera: Conjunctivae normal.  Cardiovascular:     Rate and Rhythm: Normal rate and regular rhythm.     Heart sounds: No murmur heard. Pulmonary:     Effort: Pulmonary effort is normal. No respiratory distress.     Breath sounds: Normal breath sounds.  Abdominal:     Palpations: Abdomen is soft.     Tenderness: There is no abdominal tenderness. There is no right CVA tenderness or left CVA tenderness.  Musculoskeletal:        General: Normal range of motion.     Cervical back: Neck supple.     Right lower leg: No edema.     Left lower leg: No edema.  Skin:    General: Skin is warm and dry.     Capillary Refill: Capillary refill takes less than 2 seconds.     Coloration: Skin is not jaundiced or pale.  Neurological:     General: No focal deficit present.     Mental Status: She is alert and oriented to person, place, and time.     Cranial Nerves: No cranial nerve deficit.     Sensory: No sensory deficit.     Motor: No weakness.  Psychiatric:        Mood and Affect: Mood normal.        Behavior: Behavior normal.        Thought Content: Thought content normal.        Judgment: Judgment normal.    ED Results / Procedures / Treatments   Labs (all labs ordered are listed, but only abnormal results  are displayed) Labs Reviewed  URINE CULTURE  URINALYSIS, ROUTINE W REFLEX MICROSCOPIC    EKG None  Radiology No results found.  Procedures Procedures   Medications Ordered in ED Medications - No data to display  ED Course  I have reviewed the triage vital signs and the nursing notes.  Pertinent labs & imaging results that were available during my care of the patient were reviewed by me and considered in my medical decision making (see chart for details).    MDM Rules/Calculators/A&P                           Patient presents for 2 days of dysuria.  She denies any other symptoms.  Vital signs normal upon arrival.  Patient has no suprapubic or CVA tenderness.  Urine studies ordered.  Urinalysis was negative for any evidence of infection.  Patient was informed of this result.  She was also informed that dysfunctional elimination can cause symptoms that mimic UTI.  Patient was advised to follow-up with her surgical team to inform them of recent symptoms.  Patient was discharged in good condition.  Final Clinical Impression(s) / ED Diagnoses Final diagnoses:  Dysuria    Rx / DC Orders ED Discharge Orders     None        Godfrey Pick, MD 08/21/21 [phone]

## 2021-08-20 NOTE — ED Triage Notes (Signed)
Dysuria and frequency x 2 days. States she feels "uncomfortable". Also reports having a mesh sling placed in June.

## 2021-08-21 LAB — URINE CULTURE: Culture: <10000 — AB

## 2021-08-25 ENCOUNTER — Other Ambulatory Visit: Payer: Self-pay | Admitting: Internal Medicine

## 2021-10-14 ENCOUNTER — Emergency Department (HOSPITAL_BASED_OUTPATIENT_CLINIC_OR_DEPARTMENT_OTHER)
Admission: EM | Admit: 2021-10-14 | Discharge: 2021-10-14 | Disposition: A | Discharge status: Home / Self Care | Payer: Managed Care, Other (non HMO) | Attending: Student | Admitting: Student

## 2021-10-14 ENCOUNTER — Emergency Department (HOSPITAL_BASED_OUTPATIENT_CLINIC_OR_DEPARTMENT_OTHER): Payer: Managed Care, Other (non HMO)

## 2021-10-14 ENCOUNTER — Encounter (HOSPITAL_BASED_OUTPATIENT_CLINIC_OR_DEPARTMENT_OTHER): Payer: Self-pay

## 2021-10-14 DIAGNOSIS — S8991XA Unspecified injury of right lower leg, initial encounter: Secondary | ICD-10-CM | POA: Diagnosis present

## 2021-10-14 DIAGNOSIS — W1840XA Slipping, tripping and stumbling without falling, unspecified, initial encounter: Secondary | ICD-10-CM | POA: Diagnosis not present

## 2021-10-14 DIAGNOSIS — J4520 Mild intermittent asthma, uncomplicated: Secondary | ICD-10-CM | POA: Diagnosis not present

## 2021-10-14 DIAGNOSIS — Z87891 Personal history of nicotine dependence: Secondary | ICD-10-CM | POA: Diagnosis not present

## 2021-10-14 DIAGNOSIS — Y9389 Activity, other specified: Secondary | ICD-10-CM | POA: Diagnosis not present

## 2021-10-14 DIAGNOSIS — S86811A Strain of other muscle(s) and tendon(s) at lower leg level, right leg, initial encounter: Secondary | ICD-10-CM | POA: Insufficient documentation

## 2021-10-14 DIAGNOSIS — Z7951 Long term (current) use of inhaled steroids: Secondary | ICD-10-CM | POA: Insufficient documentation

## 2021-10-14 DIAGNOSIS — S86111A Strain of other muscle(s) and tendon(s) of posterior muscle group at lower leg level, right leg, initial encounter: Secondary | ICD-10-CM

## 2021-10-14 MED ORDER — ACETAMINOPHEN 500 MG PO TABS
1000.0000 mg | ORAL_TABLET | Freq: Once | ORAL | Status: AC
  Administered 2021-10-14: 1000 mg via ORAL
  Filled 2021-10-14: qty 2

## 2021-10-14 NOTE — ED Triage Notes (Signed)
She states she was tripped while moving a heavy trash bin. She c/o sudden pain in her right calf, which persists. She arives using crutches.

## 2021-10-14 NOTE — ED Provider Notes (Signed)
[facility] EMERGENCY DEPT Provider Note   CSN: [id number] Arrival date & time: 10/14/21  1657     History Chief Complaint  Patient presents with   Leg Injury    Sierra Olsen is a 45 y.o. female with PMH celiac disease, DVT, endometriosis currently on Plavix who presents the emergency department for evaluation of right calf pain.  Patient states that she was attempting to move a piece of concrete and to a heavy trash bin when she stopped suddenly, heard a pop in her calf on the right, had an associated vagal event with this acute pain and came to the emergency department for evaluation.  She is currently unable to bear weight on the leg.  She arrives with her own crutches.  Denies numbness, tingling, weakness to lower extremity.  HPI     Past Medical History:  Diagnosis Date   Allergy    Anxiety    Anxiety    Asthma    Celiac disease    Celiac disease    DVT (deep venous thrombosis) (HCC)    Of iliac vein of LLE   DVT (deep venous thrombosis) (HCC) 01/12/2019   Endometriosis    Endometriosis    Environmental allergies    Epigastric pain    Family history of adverse reaction to anesthesia    SISTER HAD TROUBLE BREATHING   GERD (gastroesophageal reflux disease)    Headache    Hx of migraine headaches    Hx of migraine headaches    Hypoglycemia    Hypoglycemia    Leukocytosis    Low back pain    Nausea    PE (pulmonary thromboembolism) ([place]) 03/2018   PE (pulmonary thromboembolism) (HCC)    PVC (premature ventricular contraction)    Retinal edema    Retinal edema    RUQ abdominal pain    Tinnitus    Tinnitus     Patient Active Problem List   Diagnosis Date Noted   DVT (deep venous thrombosis) ([place]) 01/12/2019   PE (pulmonary thromboembolism) ([place]) 04/08/2018   Endometriosis 04/07/2018   Chest pain of uncertain etiology 58/07/9832   Asthma, mild intermittent, well-controlled 03/24/2014   Seasonal and perennial allergic rhinitis 03/24/2014     Past Surgical History:  Procedure Laterality Date   ABDOMINAL HYSTERECTOMY     DILATION AND CURETTAGE OF UTERUS     INTRAVASCULAR ULTRASOUND/IVUS N/A 01/12/2019   Procedure: INTRAVASCULAR ULTRASOUND/IVUS;  Surgeon: Waynetta Sandy, MD;  Location: [facility];  Service: Cardiovascular;  Laterality: N/A;   LOWER EXTREMITY VENOGRAPHY Left 01/12/2019   Procedure: LOWER EXTREMITY VENOGRAPHY;  Surgeon: Waynetta Sandy, MD;  Location: [facility];  Service: Cardiovascular;  Laterality: Left;   PERIPHERAL VASCULAR INTERVENTION Left 01/12/2019   Procedure: PERIPHERAL VASCULAR INTERVENTION;  Surgeon: Waynetta Sandy, MD;  Location: [facility];  Service: Cardiovascular;  Laterality: Left;  common and external iliac venous Ivus and stent   tubes in ears     age 42   WISDOM TOOTH EXTRACTION       OB History   No obstetric history on file.     Family History  Problem Relation Age of Onset   Asthma Mother    Emphysema Maternal Grandmother        smoker   Emphysema Maternal Grandfather    Colon cancer Paternal Grandmother    Heart disease Paternal Grandfather    Heart attack Paternal Grandfather    Allergies Other  both sides of family   Asthma Brother    Breast cancer Other        father side   Migraines Sister    Endometriosis Sister    Allergies Sister    Allergies Brother    Breast cancer Paternal Aunt     Social History   Tobacco Use   Smoking status: Former    Packs/day: 1.00    Years: 5.00    Pack years: 5.00    Types: Cigarettes    Quit date: 11/26/2001    Years since quitting: 19.8   Smokeless tobacco: Never  Vaping Use   Vaping Use: Never used  Substance Use Topics   Alcohol use: Yes    Alcohol/week: 7.0 standard drinks    Types: 7 Glasses of wine per week    Comment: weekly   Drug use: No    Home Medications Prior to Admission medications   Medication Sig Start Date End Date Taking? Authorizing  Provider  acetaminophen (TYLENOL) 325 MG tablet Take 325 mg by mouth every 6 (six) hours as needed (for pain.).    [provider]  albuterol (PROAIR HFA) 108 (90 Base) MCG/ACT inhaler 2 puffs every 6 hours as needed 02/12/20   Baird Lyons D, MD  ALPRAZolam Duanne Moron) 0.25 MG tablet Take 0.125 mg by mouth 2 (two) times daily as needed for anxiety.     [provider]  budesonide-formoterol [facility]) 160-4.5 MCG/ACT inhaler Inhale 2 puffs then rinse mouth, twice daily 06/24/20   Baird Lyons D, MD  clopidogrel (PLAVIX) 75 MG tablet TAKE 1 TABLET BY MOUTH EVERY DAY 08/01/21   Waynetta Sandy, MD  mometasone (NASONEX) 50 MCG/ACT nasal spray Place 2 sprays into the nose daily as needed (allergies).     [provider]  Spacer/Aero-Holding Chambers (AEROCHAMBER MV) inhaler Use as instructed 05/10/14   Baird Lyons D, MD    Allergies    Aspirin, Contrast media [iodinated diagnostic agents], and Gluten meal  Review of Systems   Review of Systems  Constitutional:  Negative for chills and fever.  HENT:  Negative for ear pain and sore throat.   Eyes:  Negative for pain and visual disturbance.  Respiratory:  Negative for cough and shortness of breath.   Cardiovascular:  Negative for chest pain and palpitations.  Gastrointestinal:  Negative for abdominal pain and vomiting.  Genitourinary:  Negative for dysuria and hematuria.  Musculoskeletal:  Positive for myalgias. Negative for arthralgias and back pain.  Skin:  Negative for color change and rash.  Neurological:  Negative for seizures and syncope.  All other systems reviewed and are negative.  Physical Exam Updated Vital Signs BP 114/70 (BP Location: Right Arm)   Pulse (!) 56   Temp 98.2 F (36.8 C)   Resp 20   LMP 03/25/2017 (Approximate)   SpO2 97%   Physical Exam Vitals and nursing note reviewed.  Constitutional:      General: She is not in acute distress.    Appearance: She is well-developed.   HENT:     Head: Normocephalic and atraumatic.  Eyes:     Conjunctiva/sclera: Conjunctivae normal.  Cardiovascular:     Rate and Rhythm: Normal rate and regular rhythm.     Heart sounds: No murmur heard. Pulmonary:     Effort: Pulmonary effort is normal. No respiratory distress.     Breath sounds: Normal breath sounds.  Abdominal:     Palpations: Abdomen is soft.     Tenderness: There is  no abdominal tenderness.  Musculoskeletal:        General: Tenderness (Right calf) present. No swelling.     Cervical back: Neck supple.  Skin:    General: Skin is warm and dry.     Capillary Refill: Capillary refill takes less than 2 seconds.  Neurological:     Mental Status: She is alert.  Psychiatric:        Mood and Affect: Mood normal.    ED Results / Procedures / Treatments   Labs (all labs ordered are listed, but only abnormal results are displayed) Labs Reviewed - No data to display  EKG None  Radiology DG Tibia/Fibula Right  Result Date: 10/14/2021 CLINICAL DATA:  Right lower extremity injury. EXAM: RIGHT TIBIA AND FIBULA - 2 VIEW COMPARISON:  None. FINDINGS: There is no evidence of fracture or other focal bone lesions. Soft tissues are unremarkable. IMPRESSION: No evidence of fractures. Electronically Signed   By: Telford Nab M.D.   On: 10/14/2021 21:05    Procedures Procedures   Medications Ordered in ED Medications  acetaminophen (TYLENOL) tablet 1,000 mg (1,000 mg Oral Given 10/14/21 2109)    ED Course  I have reviewed the triage vital signs and the nursing notes.  Pertinent labs & imaging results that were available during my care of the patient were reviewed by me and considered in my medical decision making (see chart for details).    MDM Rules/Calculators/A&P                           Patient seen the emergency department for evaluation of right calf pain.  Physical exam reveals tenderness at the insertion point of the gastrocnemius with an intact ability  to plantarflex.  No appreciable tenderness at the Achilles insertion point on the calcaneus.  X-ray with no fracture.  Patient presentation consistent with gastrocnemius strain or tear and she was placed in dorsiflexion in a walking boot and encouraged to take Tylenol for pain control and follow-up with an orthopedist.  She was encouraged to weight-bear as tolerated. Final Clinical Impression(s) / ED Diagnoses Final diagnoses:  Strain of right gastrocnemius muscle, initial encounter    Rx / DC Orders ED Discharge Orders     None        Merie Wulf, Debe Coder, MD 10/14/21 2329

## 2021-11-03 ENCOUNTER — Other Ambulatory Visit: Payer: Self-pay | Admitting: Family Medicine

## 2021-11-03 DIAGNOSIS — Z1231 Encounter for screening mammogram for malignant neoplasm of breast: Secondary | ICD-10-CM

## 2021-11-08 NOTE — Progress Notes (Signed)
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 Had tubes in ears, otherwise no ENT surgery.  Celiac Positive- avoids gluten.Denies GERD Office Spirometry 02/10/18-WNL-FEV1 3.70/100%, FEV1 3.13/105%, ratio 1.85, FEF 25-75% 3.76/122%  -----------------------------------------------------------------------------------    06/24/20- 79 year old former smoker followed for Asthma, allergic rhinitis, Aspirin Allergy, complicated by celiac disease, endometriosis, PAD/ [name], DVT/ PE 04/07/18/ Plavix Symbicort 160, Proair,  -----yearly follow up Had 2 Phizer Covax Aspirin triggered allergic conjunctivitis. Asthma associated with allergy to dogs, cats, horses, carpet dust). Little recent need for rescue inhaler. Symbicort 160, Proair hfa, Nasonex,   11/09/21- 45 year old former smoker followed for Asthma, allergic Rhinitis, Aspirin Allergy, complicated by Celiac disease, Endometriosis, PAD/ [name], DVT/ PE 04/07/18/ Plavix, -Symbicort 160, Proair, Nasonex, Covid vax- 3 Phizer Flu vax-had -----Patient needs refill on her rescue inhaler, overall feeling good ACT score- 24 She and family had had COVID infection in May.  Her illness was mild and resolved without specific treatment.  Since then her breathing has been comfortable.  Medications do help.  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  ROS-see HPI  + = positive Constitutional:   No-   weight loss, night sweats, fevers, chills, fatigue, lassitude. HEENT:   No-  headaches, difficulty swallowing, tooth/dental problems, sore throat,       No-  sneezing, itching, ear ache, nasal congestion, post nasal drip,  CV:   chest pain, orthopnea, PND, swelling in lower extremities, anasarca,               dizziness, +palpitations Resp: + shortness of breath with exertion or at rest.                productive cough,  + non-productive cough,  No- coughing up of blood.              No-   change in color of mucus.  + wheezing.   Skin: No-   rash or lesions. GI:  No-   heartburn, indigestion, abdominal pain, nausea, vomiting,  GU:  MS:  No-   joint pain or swelling.   Neuro-     nothing unusual Psych:  No- change in mood or affect. No depression or anxiety.  No memory loss.  OBJ- Physical Exam General- Alert, Oriented, Affect-appropriate, Distress- none acute, well appearing Skin- rash-none, lesions- none, excoriation- none Lymphadenopathy- none Head- atraumatic            Eyes- Gross vision intact, PERRLA, conjunctivae and secretions clear            Ears- Hearing, canals-normal            Nose-  clear, no-Septal dev, mucus, polyps, erosion, perforation             Throat- Mallampati II , mucosa clear , drainage- none, tonsils- atrophic Neck- flexible , trachea midline, no stridor , thyroid nl, carotid no bruit Chest - symmetrical excursion , unlabored           Heart/CV- RRR , no murmur , no gallop  , no rub, nl s1 s2                           - JVD- none , edema- none, stasis changes- none, varices- none           Lung- clear to P&A, wheeze-  none, cough- none , dullness-none, rub- none           Chest wall-  Abd-  Br/ Gen/ Rectal- Not done, not indicated Extrem- cyanosis- none, clubbing, none, atrophy- none, strength- nl Neuro- grossly intact to observation

## 2021-11-09 ENCOUNTER — Other Ambulatory Visit: Payer: Self-pay

## 2021-11-09 ENCOUNTER — Encounter: Payer: Self-pay | Admitting: Internal Medicine

## 2021-11-09 ENCOUNTER — Ambulatory Visit (INDEPENDENT_AMBULATORY_CARE_PROVIDER_SITE_OTHER): Payer: Managed Care, Other (non HMO) | Admitting: Internal Medicine

## 2021-11-09 DIAGNOSIS — J3089 Other allergic rhinitis: Secondary | ICD-10-CM | POA: Diagnosis not present

## 2021-11-09 DIAGNOSIS — J302 Other seasonal allergic rhinitis: Secondary | ICD-10-CM

## 2021-11-09 DIAGNOSIS — J4520 Mild intermittent asthma, uncomplicated: Secondary | ICD-10-CM

## 2021-11-09 MED ORDER — ALBUTEROL SULFATE HFA 108 (90 BASE) MCG/ACT IN AERS: INHALATION_SPRAY | RESPIRATORY_TRACT | 4 refills | Status: DC

## 2021-11-09 MED ORDER — BUDESONIDE-FORMOTEROL FUMARATE 160-4.5 MCG/ACT IN AERO: INHALATION_SPRAY | RESPIRATORY_TRACT | 4 refills | Status: DC

## 2021-11-09 NOTE — Assessment & Plan Note (Signed)
Seasonal exacerbations do respond to antihistamines and Nasonex when needed.  Currently she is comfortable.

## 2021-11-09 NOTE — Patient Instructions (Signed)
Refills sent for albuterol rescue inhaler and for Symbicort  Please call if we can help, and have a wonderful Christmas

## 2021-11-09 NOTE — Assessment & Plan Note (Signed)
She continues intermittent uncomplicated, doing well with current meds. Plan-refill Symbicort and albuterol inhaler.

## 2021-12-06 ENCOUNTER — Ambulatory Visit
Admission: RE | Admit: 2021-12-06 | Discharge: 2021-12-06 | Disposition: A | Discharge status: Home / Self Care | Payer: Managed Care, Other (non HMO) | Source: Ambulatory Visit

## 2021-12-06 DIAGNOSIS — Z1231 Encounter for screening mammogram for malignant neoplasm of breast: Secondary | ICD-10-CM

## 2022-03-27 ENCOUNTER — Ambulatory Visit (INDEPENDENT_AMBULATORY_CARE_PROVIDER_SITE_OTHER): Payer: Managed Care, Other (non HMO)

## 2022-03-27 ENCOUNTER — Encounter: Payer: Self-pay | Admitting: Interventional Cardiology

## 2022-03-27 ENCOUNTER — Ambulatory Visit (INDEPENDENT_AMBULATORY_CARE_PROVIDER_SITE_OTHER): Payer: Managed Care, Other (non HMO) | Admitting: Interventional Cardiology

## 2022-03-27 VITALS — BP 112/70 | HR 51 | Ht 64.0 in | Wt 155.0 lb

## 2022-03-27 DIAGNOSIS — R002 Palpitations: Secondary | ICD-10-CM

## 2022-03-27 DIAGNOSIS — I871 Compression of vein: Secondary | ICD-10-CM | POA: Diagnosis not present

## 2022-03-27 NOTE — Progress Notes (Signed)
?  ?Cardiology Office Note ? ? ?Date:  03/27/2022  ? ?ID:  Sierra Olsen, DOB Jan 18, 1976, MRN [id number] ? ?PCP:  Marda Stalker, PA-C  ? ? ?No chief complaint on file. ? ?palpitations ? ?Wt Readings from Last 3 Encounters:  ?03/27/22 155 lb (70.3 kg)  ?11/09/21 152 lb (68.9 kg)  ?08/20/21 150 lb (68 kg)  ?  ? ?  ?History of Present Illness: ?Sierra Olsen is a 46 y.o. female who is being seen today for the evaluation of palpitations at the request of Marda Stalker, [place].  ? ?She has had palpitations for years. Episodes felt like a skipped beat.  She had occasional racing heart fro a few minutes.  She wore a monitor a few years ago with Dr. 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  No beta-blocker or other rate slowing drugs.  Sx resolved  Resting HR was already slow. ? ?More recently the, steroids are a more prolonged racing with heart rates up to about 100 bpm.  Episodes last seconds to 10 minutes.  ? ?She has been increasing exercise.  She walked 10 miles a few days ago and felt fine.   ? ?Denies : Chest pain. Dizziness. Leg edema. Nitroglycerin use. Orthopnea. Paroxysmal nocturnal dyspnea. Syncope.   ? ?Feels some anxiety.  ? ?Past Medical History:  ?Diagnosis Date  ? Allergy   ? Anxiety   ? Anxiety   ? Asthma   ? Celiac disease   ? Celiac disease   ? DVT (deep venous thrombosis) ([place])   ? Of iliac vein of LLE  ? DVT (deep venous thrombosis) ([place]) 01/12/2019  ? Endometriosis   ? Endometriosis   ? Environmental allergies   ? Epigastric pain   ? Family history of adverse reaction to anesthesia   ? SISTER HAD TROUBLE BREATHING  ? GERD (gastroesophageal reflux disease)   ? Headache   ? Hx of migraine headaches   ? Hx of migraine headaches   ? Hypoglycemia   ? Hypoglycemia   ? Leukocytosis   ? Low back pain   ? Nausea   ? PE (pulmonary thromboembolism) ([place]) 03/2018  ? PE (pulmonary thromboembolism) ([place])   ? PVC (premature ventricular contraction)   ? Retinal edema   ? Retinal edema   ? RUQ abdominal  pain   ? Tinnitus   ? Tinnitus   ? ? ?Past Surgical History:  ?Procedure Laterality Date  ? ABDOMINAL HYSTERECTOMY    ? DILATION AND CURETTAGE OF UTERUS    ? INTRAVASCULAR ULTRASOUND/IVUS N/A 01/12/2019  ? Procedure: INTRAVASCULAR ULTRASOUND/IVUS;  Surgeon: Waynetta Sandy, MD;  Location: [facility];  Service: Cardiovascular;  Laterality: N/A;  ? LOWER EXTREMITY VENOGRAPHY Left 01/12/2019  ? Procedure: LOWER EXTREMITY VENOGRAPHY;  Surgeon: Waynetta Sandy, MD;  Location: [facility];  Service: Cardiovascular;  Laterality: Left;  ? PERIPHERAL VASCULAR INTERVENTION Left 01/12/2019  ? Procedure: PERIPHERAL VASCULAR INTERVENTION;  Surgeon: Waynetta Sandy, MD;  Location: [facility];  Service: Cardiovascular;  Laterality: Left;  common and external iliac venous Ivus and stent  ? tubes in ears    ? age 36  ? WISDOM TOOTH EXTRACTION    ? ? ? ?Current Outpatient Medications  ?Medication Sig Dispense Refill  ? acetaminophen (TYLENOL) 325 MG tablet Take 325 mg by mouth every 6 (six) hours as needed (for pain.).    ? albuterol (PROAIR HFA) 108 (90 Base) MCG/ACT inhaler 2 puffs every 6  hours as needed 54 g 4  ? ALPRAZolam (XANAX) 0.25 MG tablet Take 0.125 mg by mouth 2 (two) times daily as needed for anxiety.     ? budesonide-formoterol (SYMBICORT) 160-4.5 MCG/ACT inhaler Inhale 2 puffs then rinse mouth, twice daily 3 each 4  ? clopidogrel (PLAVIX) 75 MG tablet TAKE 1 TABLET BY MOUTH EVERY DAY 90 tablet 3  ? mometasone (NASONEX) 50 MCG/ACT nasal spray Place 2 sprays into the nose daily as needed (allergies).     ? sertraline (ZOLOFT) 25 MG tablet Patient takes 1/2 tablet everyday    ? Spacer/Aero-Holding Chambers (AEROCHAMBER MV) inhaler Use as instructed 1 each 0  ? ?No current facility-administered medications for this visit.  ? ? ?Allergies:   Aspirin, Contrast media [iodinated contrast media], and Gluten meal  ? ? ?Social History:  The patient  reports that she quit  smoking about 20 years ago. Her smoking use included cigarettes. She has a 5.00 pack-year smoking history. She has never used smokeless tobacco. She reports current alcohol use of about 7.0 standard drinks per week. She reports that she does not use drugs.  ? ?Family History:  The patient's family history includes Allergies in her brother, sister, and another family member; Asthma in her brother and mother; Breast cancer in her paternal aunt and another family member; Colon cancer in her paternal grandmother; Emphysema in her maternal grandfather and maternal grandmother; Endometriosis in her sister; Heart attack in her paternal grandfather; Heart disease in her paternal grandfather; Migraines in her sister.  ? ? ?ROS:  Please see the history of present illness.   Otherwise, review of systems are positive for palpitations.   All other systems are reviewed and negative.  ? ? ?PHYSICAL EXAM: ?VS:  BP 112/70   Pulse (!) 51   Ht 5' 4"  (1.626 m)   Wt 155 lb (70.3 kg)   LMP 03/25/2017 (Approximate)   SpO2 97%   BMI 26.61 kg/m?  , BMI Body mass index is 26.61 kg/m?. ?GEN: Well nourished, well developed, in no acute distress ?HEENT: normal ?Neck: no JVD, carotid bruits, or masses ?Cardiac: RRR; no murmurs, rubs, or gallops,no edema  ?Respiratory:  clear to auscultation bilaterally, normal work of breathing ?GI: soft, nontender, nondistended, + BS ?MS: no deformity or atrophy ?Skin: warm and dry, no rash ?Neuro:  Strength and sensation are intact ?Psych: euthymic mood, full affect ? ? ?EKG:   ?The ekg ordered today demonstrates sinus bradycardia, no ST changes ? ? ?Recent Labs: ?No results found for requested labs within last 8760 hours.  ? ?Lipid Panel ?No results found for: CHOL, TRIG, HDL, CHOLHDL, VLDL, LDLCALC, LDLDIRECT ?  ?Other studies Reviewed: ?Additional studies/ records that were reviewed today with results demonstrating: records reviewed- LDL 80 in 4/23. ? ? ?ASSESSMENT AND PLAN: ? ?Palpitations: 2 weeks  Zio patch to evaluate palpitations.  She has known PVCs.  Need to evaluate for a more prolonged, sustained SVT.  She has an Visual merchandiser but prefers to use the monitor.  No high risk features to er sx.  No syncope.  ?H/o PE: [name]- venous stent.  Lifelong plavix. Allergic to aspirin. ? ? ? ?Current medicines are reviewed at length with the patient today.  The patient concerns regarding her medicines were addressed. ? ?The following changes have been made:  No change ? ?Labs/ tests ordered today include: 2 weeek zio patch ?No orders of the defined types were placed in this encounter. ? ? ?Recommend 150 minutes/week  of aerobic exercise ?Low fat, low carb, high fiber diet recommended ? ?Disposition:   FU based on monitor results.;  she has a trip to [place] planned inJune 2023 ? ? ?Signed, ?Larae Grooms, MD  ?03/27/2022 2:59 PM    ?[place] ?[place], [place], [place]  [address] ?Phone: [phone]; Fax: [phone]  ? ?

## 2022-03-27 NOTE — Patient Instructions (Signed)
Medication Instructions:  ?Your physician recommends that you continue on your current medications as directed. Please refer to the Current Medication list given to you today. ? ?*If you need a refill on your cardiac medications before your next appointment, please call your pharmacy* ? ? ?Lab Work: ?none ?If you have labs (blood work) drawn today and your tests are completely normal, you will receive your results only by: ?MyChart Message (if you have MyChart) OR ?A paper copy in the mail ?If you have any lab test that is abnormal or we need to change your treatment, we will call you to review the results. ? ? ?Testing/Procedures: ?Dr Irish Lack recommends  you wear a 2 week zio patch ? ? ?Follow-Up: ?At [facility], you and your health needs are our priority.  As part of our continuing mission to provide you with exceptional heart care, we have created designated Provider Care Teams.  These Care Teams include your primary Cardiologist (physician) and Advanced Practice Providers (APPs -  Physician Assistants and Nurse Practitioners) who all work together to provide you with the care you need, when you need it. ? ?We recommend signing up for the patient portal called "MyChart".  Sign up information is provided on this After Visit Summary.  MyChart is used to connect with patients for Virtual Visits (Telemedicine).  Patients are able to view lab/test results, encounter notes, upcoming appointments, etc.  Non-urgent messages can be sent to your provider as well.   ?To learn more about what you can do with MyChart, go to [email or web address].   ? ?Your next appointment:   ?Based on results of monitor ? ?The format for your next appointment:   ?In Person ? ?Provider:   ?Larae Grooms, MD   ? ? ?Other Instructions ?ZIO XT- Long Term Monitor Instructions ? ?Your physician has requested you wear a ZIO patch monitor for 14 days.  ?This is a single patch monitor. [employer] supplies one patch monitor per enrollment.  Additional ?stickers are not available. Please do not apply patch if you will be having a Nuclear Stress Test,  ?Echocardiogram, Cardiac CT, MRI, or Chest Xray during the period you would be wearing the  ?monitor. The patch cannot be worn during these tests. You cannot remove and re-apply the  ?ZIO XT patch monitor.  ?Your ZIO patch monitor will be mailed 3 day USPS to your address on file. It may take 3-5 days  ?to receive your monitor after you have been enrolled.  ?Once you have received your monitor, please review the enclosed instructions. Your monitor  ?has already been registered assigning a specific monitor serial # to you. ? ?Billing and Patient Assistance Program Information ? ?We have supplied [employer] with any of your insurance information on file for billing purposes. ?[employer] offers a sliding scale Patient Assistance Program for patients that do not have  ?insurance, or whose insurance does not completely cover the cost of the ZIO monitor.  ?You must apply for the Patient Assistance Program to qualify for this discounted rate.  ?To apply, please call [employer] at [phone], select option 4, select option 2, ask to apply for  ?Patient Assistance Program. Theodore Demark will ask your household income, and how many people  ?are in your household. They will quote your out-of-pocket cost based on that information.  ?[employer] will also be able to set up a 53-month interest-free payment plan if needed. ? ?Applying the monitor ?  ?Shave hair from upper left chest.  ?Hold abrader disc by orange  tab. Rub abrader in 40 strokes over the upper left chest as  ?indicated in your monitor instructions.  ?Clean area with 4 enclosed alcohol pads. Let dry.  ?Apply patch as indicated in monitor instructions. Patch will be placed under collarbone on left  ?side of chest with arrow pointing upward.  ?Rub patch adhesive wings for 2 minutes. Remove white label marked "1". Remove the white  ?label marked "2". Rub patch adhesive wings  for 2 additional minutes.  ?While looking in a mirror, press and release button in center of patch. A small green light will  ?flash 3-4 times. This will be your only indicator that the monitor has been turned on.  ?Do not shower for the first 24 hours. You may shower after the first 24 hours.  ?Press the button if you feel a symptom. You will hear a small click. Record Date, Time and  ?Symptom in the Patient Logbook.  ?When you are ready to remove the patch, follow instructions on the last 2 pages of Patient  ?Logbook. Stick patch monitor onto the last page of Patient Logbook.  ?Place Patient Logbook in the blue and white box. Use locking tab on box and tape box closed  ?securely. The blue and white box has prepaid postage on it. Please place it in the mailbox as  ?soon as possible. Your physician should have your test results approximately 7 days after the  ?monitor has been mailed back to [facility].  ?Call [facility] at [phone] if you have questions regarding  ?your ZIO XT patch monitor. Call them immediately if you see an orange light blinking on your  ?monitor.  ?If your monitor falls off in less than 4 days, contact our Monitor department at [phone].  ?If your monitor becomes loose or falls off after 4 days call [employer] at [phone] for  ?suggestions on securing your monitor ? ? ?Important Information About Sugar ? ? ? ? ? ? ?

## 2022-03-27 NOTE — Progress Notes (Unsigned)
Enrolled patient for a 14 day Zio XT  monitor to be mailed to patients home  °

## 2022-03-29 DIAGNOSIS — R002 Palpitations: Secondary | ICD-10-CM | POA: Diagnosis not present

## 2022-04-25 ENCOUNTER — Other Ambulatory Visit: Payer: Self-pay | Admitting: *Deleted

## 2022-04-25 DIAGNOSIS — I493 Ventricular premature depolarization: Secondary | ICD-10-CM

## 2022-04-25 DIAGNOSIS — I498 Other specified cardiac arrhythmias: Secondary | ICD-10-CM

## 2022-04-25 DIAGNOSIS — I491 Atrial premature depolarization: Secondary | ICD-10-CM

## 2022-05-25 ENCOUNTER — Encounter: Payer: Self-pay | Admitting: Internal Medicine

## 2022-05-25 ENCOUNTER — Ambulatory Visit (INDEPENDENT_AMBULATORY_CARE_PROVIDER_SITE_OTHER): Payer: Managed Care, Other (non HMO) | Admitting: Internal Medicine

## 2022-05-25 DIAGNOSIS — R002 Palpitations: Secondary | ICD-10-CM | POA: Diagnosis not present

## 2022-05-25 MED ORDER — FLECAINIDE ACETATE 50 MG PO TABS: 50.0000 mg | ORAL_TABLET | Freq: Two times a day (BID) | ORAL | 3 refills | Status: DC

## 2022-05-25 NOTE — Progress Notes (Signed)
HPI Sierra Olsen is referred by Dr. Irish Lack for evaluation of palpitations due to occaisional PVC's and junctional rhythm in the 80-100 range. She is a pleasant 46 yo woman with the above problems. She wore a cardiac monitor and showed occaisional PVC's and junctional rhythm for which she is symptomatic.  Allergies  Allergen Reactions   Aspirin     Eye swelling   Contrast Media [Iodinated Contrast Media]     wheezing   Gluten Meal Other (See Comments)    celiac disease     Current Outpatient Medications  Medication Sig Dispense Refill   acetaminophen (TYLENOL) 325 MG tablet Take 325 mg by mouth every 6 (six) hours as needed (for pain.).     albuterol (PROAIR HFA) 108 (90 Base) MCG/ACT inhaler 2 puffs every 6 hours as needed 54 g 4   ALPRAZolam (XANAX) 0.25 MG tablet Take 0.125 mg by mouth 2 (two) times daily as needed for anxiety.      budesonide-formoterol (SYMBICORT) 160-4.5 MCG/ACT inhaler Inhale 2 puffs then rinse mouth, twice daily 3 each 4   clopidogrel (PLAVIX) 75 MG tablet TAKE 1 TABLET BY MOUTH EVERY DAY 90 tablet 3   flecainide (TAMBOCOR) 50 MG tablet Take 1 tablet (50 mg total) by mouth 2 (two) times daily. 60 tablet 3   mometasone (NASONEX) 50 MCG/ACT nasal spray Place 2 sprays into the nose daily as needed (allergies).      sertraline (ZOLOFT) 25 MG tablet Patient takes 1/2 tablet everyday     Spacer/Aero-Holding Chambers (AEROCHAMBER MV) inhaler Use as instructed 1 each 0   No current facility-administered medications for this visit.     Past Medical History:  Diagnosis Date   Allergy    Anxiety    Anxiety    Asthma    Celiac disease    Celiac disease    DVT (deep venous thrombosis) (HCC)    Of iliac vein of LLE   DVT (deep venous thrombosis) (HCC) 01/12/2019   Endometriosis    Endometriosis    Environmental allergies    Epigastric pain    Family history of adverse reaction to anesthesia    SISTER HAD TROUBLE BREATHING   GERD (gastroesophageal  reflux disease)    Headache    Hx of migraine headaches    Hx of migraine headaches    Hypoglycemia    Hypoglycemia    Leukocytosis    Low back pain    Nausea    PE (pulmonary thromboembolism) ([place]) 03/2018   PE (pulmonary thromboembolism) (HCC)    PVC (premature ventricular contraction)    Retinal edema    Retinal edema    RUQ abdominal pain    Tinnitus    Tinnitus     ROS:   All systems reviewed and negative except as noted in the HPI.   Past Surgical History:  Procedure Laterality Date   ABDOMINAL HYSTERECTOMY     DILATION AND CURETTAGE OF UTERUS     INTRAVASCULAR ULTRASOUND/IVUS N/A 01/12/2019   Procedure: INTRAVASCULAR ULTRASOUND/IVUS;  Surgeon: Waynetta Sandy, MD;  Location: [facility];  Service: Cardiovascular;  Laterality: N/A;   LOWER EXTREMITY VENOGRAPHY Left 01/12/2019   Procedure: LOWER EXTREMITY VENOGRAPHY;  Surgeon: Waynetta Sandy, MD;  Location: [facility];  Service: Cardiovascular;  Laterality: Left;   PERIPHERAL VASCULAR INTERVENTION Left 01/12/2019   Procedure: PERIPHERAL VASCULAR INTERVENTION;  Surgeon: Waynetta Sandy, MD;  Location: [facility];  Service: Cardiovascular;  Laterality: Left;  common and external iliac venous Ivus and stent   tubes in ears     age 12   WISDOM 51 EXTRACTION       Family History  Problem Relation Age of Onset   Asthma Mother    Emphysema Maternal Grandmother        smoker   Emphysema Maternal Grandfather    Colon cancer Paternal Grandmother    Heart disease Paternal Grandfather    Heart attack Paternal Grandfather    Allergies Other        both sides of family   Asthma Brother    Breast cancer Other        father side   Migraines Sister    Endometriosis Sister    Allergies Sister    Allergies Brother    Breast cancer Paternal Aunt      Social History   Socioeconomic History   Marital status: Married    Spouse name: Not on file   Number of  children: 1   Years of education: Not on file   Highest education level: Not on file  Occupational History   Occupation: clergy  Tobacco Use   Smoking status: Former    Packs/day: 1.00    Years: 5.00    Total pack years: 5.00    Types: Cigarettes    Quit date: 11/26/2001    Years since quitting: 20.5   Smokeless tobacco: Never  Vaping Use   Vaping Use: Never used  Substance and Sexual Activity   Alcohol use: Yes    Alcohol/week: 7.0 standard drinks of alcohol    Types: 7 Glasses of wine per week    Comment: weekly   Drug use: No   Sexual activity: Not on file  Other Topics Concern   Not on file  Social History Narrative   Not on file   Social Determinants of Health   Financial Resource Strain: Not on file  Food Insecurity: Not on file  Transportation Needs: Not on file  Physical Activity: Not on file  Stress: Not on file  Social Connections: Not on file  Intimate Partner Violence: Not on file     BP 106/68   Pulse (!) 49   Ht 5' 4"  (1.626 m)   Wt 151 lb 9.6 oz (68.8 kg)   LMP 03/25/2017 (Approximate)   SpO2 98%   BMI 26.02 kg/m   Physical Exam:  Well appearing 46 yo woman, NAD HEENT: Unremarkable Neck:  No JVD, no thyromegally Lymphatics:  No adenopathy Back:  No CVA tenderness Lungs:  Clear with no wheezes HEART:  Regular rate rhythm, no murmurs, no rubs, no clicks Abd:  soft, positive bowel sounds, no organomegally, no rebound, no guarding Ext:  2 plus pulses, no edema, no cyanosis, no clubbing Skin:  No rashes no nodules Neuro:  CN II through XII intact, motor grossly intact   Assess/Plan:  Palpitations - I discussed the etiology of her symptoms as well as their benign nature. I offered her watchful waiting, a beta blocker or low dose flecainide. She would like to see how she does and will start flecainide in the next several weeks if the symptoms are not better. Hypercoagulable state - she is stable on plavix.  Sierra Olsen Sierra Gorniak,MD

## 2022-05-25 NOTE — Patient Instructions (Addendum)
Medication Instructions:  Your physician has recommended you make the following change in your medication:   Flecainide 50 mg-  You will take ONE tablet by mouth TWO times daily.    2.  Contact us through MyChart to let us know when you start your Flecainide 50 mg.     Labwork: None ordered.  Testing/Procedures: None ordered.  Follow-Up: Your physician wants you to follow-up based on medication usage.      Any Other Special Instructions Will Be Listed Below (If Applicable).  If you need a refill on your cardiac medications before your next appointment, please call your pharmacy.   Important Information About Sugar      Flecainide Tablets What is this medication? FLECAINIDE (FLEK a nide) prevents and treats a fast or irregular heartbeat (arrhythmia). It is often used to treat a type of arrhythmia known as AFib (atrial fibrillation). It works by slowing down overactive electric signals in the heart, which stabilizes your heart rhythm. It belongs to a group of medications called antiarrhythmics. This medicine may be used for other purposes; ask your health care provider or pharmacist if you have questions. COMMON BRAND NAME(S): Tambocor What should I tell my care team before I take this medication? They need to know if you have any of these conditions: Abnormal levels of potassium in the blood Heart disease including heart rhythm and heart rate problems Kidney or liver disease Recent heart attack An unusual or allergic reaction to flecainide, local anesthetics, other medications, foods, dyes, or preservatives Pregnant or trying to get pregnant Breast-feeding How should I use this medication? Take this medication by mouth with a glass of water. Follow the directions on the prescription label. You can take this medication with or without food. Take your doses at regular intervals. Do not take your medication more often than directed. Do not stop taking this medication suddenly.  This may cause serious, heart-related side effects. If your care team wants you to stop the medication, the dose may be slowly lowered over time to avoid any side effects. Talk to your care team regarding the use of this medication in children. While this medication may be prescribed for children as Ipek Westra as 1 year of age for selected conditions, precautions do apply. Overdosage: If you think you have taken too much of this medicine contact a poison control center or emergency room at once. NOTE: This medicine is only for you. Do not share this medicine with others. What if I miss a dose? If you miss a dose, take it as soon as you can. If it is almost time for your next dose, take only that dose. Do not take double or extra doses. What may interact with this medication? Do not take this medication with any of the following: Amoxapine Arsenic trioxide Certain antibiotics like clarithromycin, erythromycin, gatifloxacin, gemifloxacin, levofloxacin, moxifloxacin, sparfloxacin, or troleandomycin Certain antidepressants called tricyclic antidepressants like amitriptyline, imipramine, or nortriptyline Certain medications to control heart rhythm like disopyramide, encainide, moricizine, procainamide, propafenone, and quinidine Cisapride Delavirdine Droperidol Haloperidol [place] Imatinib Levomethadyl Maprotiline Medications for malaria like chloroquine and halofantrine Pentamidine Phenothiazines like chlorpromazine, mesoridazine, prochlorperazine, thioridazine Pimozide Quinine Ranolazine Ritonavir Sertindole This medication may also interact with the following: Cimetidine Dofetilide Medications for angina or high blood pressure Medications to control heart rhythm like amiodarone and digoxin Ziprasidone This list may not describe all possible interactions. Give your health care provider a list of all the medicines, herbs, non-prescription drugs, or dietary supplements you use. Also tell  them if you smoke, drink alcohol, or use illegal drugs. Some items may interact with your medicine. What should I watch for while using this medication? Visit your care team for regular checks on your progress. Because your condition and the use of this medication carries some risk, it is a good idea to carry an identification card, necklace or bracelet with details of your condition, medications, and care team. Check your blood pressure and pulse rate regularly. Ask your care team what your blood pressure and pulse rate should be, and when you should contact them. Your care team also may schedule regular blood tests and electrocardiograms to check your progress. You may get drowsy or dizzy. Do not drive, use machinery, or do anything that needs mental alertness until you know how this medication affects you. Do not stand or sit up quickly, especially if you are an older patient. This reduces the risk of dizzy or fainting spells. Alcohol can make you more dizzy, increase flushing and rapid heartbeats. Avoid alcoholic drinks. What side effects may I notice from receiving this medication? Side effects that you should report to your care team as soon as possible: Allergic reactions--skin rash, itching, hives, swelling of the face, lips, tongue, or throat Heart failure--shortness of breath, swelling of the ankles, feet, or hands, sudden weight gain, unusual weakness or fatigue Heart rhythm changes--fast or irregular heartbeat, dizziness, feeling faint or lightheaded, chest pain, trouble breathing Liver injury--right upper belly pain, loss of appetite, nausea, light-colored stool, dark yellow or brown urine, yellowing skin or eyes, unusual weakness or fatigue Side effects that usually do not require medical attention (report to your care team if they continue or are bothersome): Blurry vision Constipation Dizziness Fatigue Headache Nausea Tremors or shaking This list may not describe all possible side  effects. Call your doctor for medical advice about side effects. You may report side effects to FDA at 1-800-FDA-1088. Where should I keep my medication? Keep out of the reach of children and pets. Store at room temperature between 15 and 30 degrees C (59 and 86 degrees F). Protect from light. Keep container tightly closed. Throw away any unused medication after the expiration date. NOTE: This sheet is a summary. It may not cover all possible information. If you have questions about this medicine, talk to your doctor, pharmacist, or health care provider.  2023 Elsevier/Gold Standard (2021-01-06 00:00:00)

## 2022-08-10 ENCOUNTER — Other Ambulatory Visit: Payer: Self-pay | Admitting: Vascular Surgery

## 2022-08-10 ENCOUNTER — Other Ambulatory Visit: Payer: Self-pay

## 2022-08-10 MED ORDER — CLOPIDOGREL BISULFATE 75 MG PO TABS: 75.0000 mg | ORAL_TABLET | Freq: Every day | ORAL | 3 refills | Status: DC

## 2022-08-21 ENCOUNTER — Telehealth: Payer: Self-pay

## 2022-08-21 ENCOUNTER — Other Ambulatory Visit (HOSPITAL_COMMUNITY): Payer: Self-pay

## 2022-08-21 NOTE — Telephone Encounter (Signed)
Received notification from [employer] regarding a prior authorization for Symbicort 160-4.5MCG/ACT aerosol.   This request has been approved using information available on the patient's profile. Case [id number] Status:Approved Review Type:Prior Auth Coverage Start Date:07/22/2022 Coverage End Date:08/21/2023

## 2022-08-21 NOTE — Telephone Encounter (Signed)
PA submitted through [employer] for Symbicort 160-4.5MCG/ACT aerosol.  Awaiting determination and or additional questions.   Key: [id number]

## 2022-08-31 IMAGING — MG MM DIGITAL SCREENING BILAT W/ TOMO AND CAD
6 of 10 series · 6 of 30 positions shown · non-contrast
Comparison: Previous exam(s).

CLINICAL DATA: Screening.

EXAM:
DIGITAL SCREENING BILATERAL MAMMOGRAM WITH TOMOSYNTHESIS AND CAD
TECHNIQUE: Bilateral screening digital craniocaudal and mediolateral oblique
mammograms were obtained. Bilateral screening digital breast
tomosynthesis was performed. The images were evaluated with
computer-aided detection.

[L CC synth-2D]
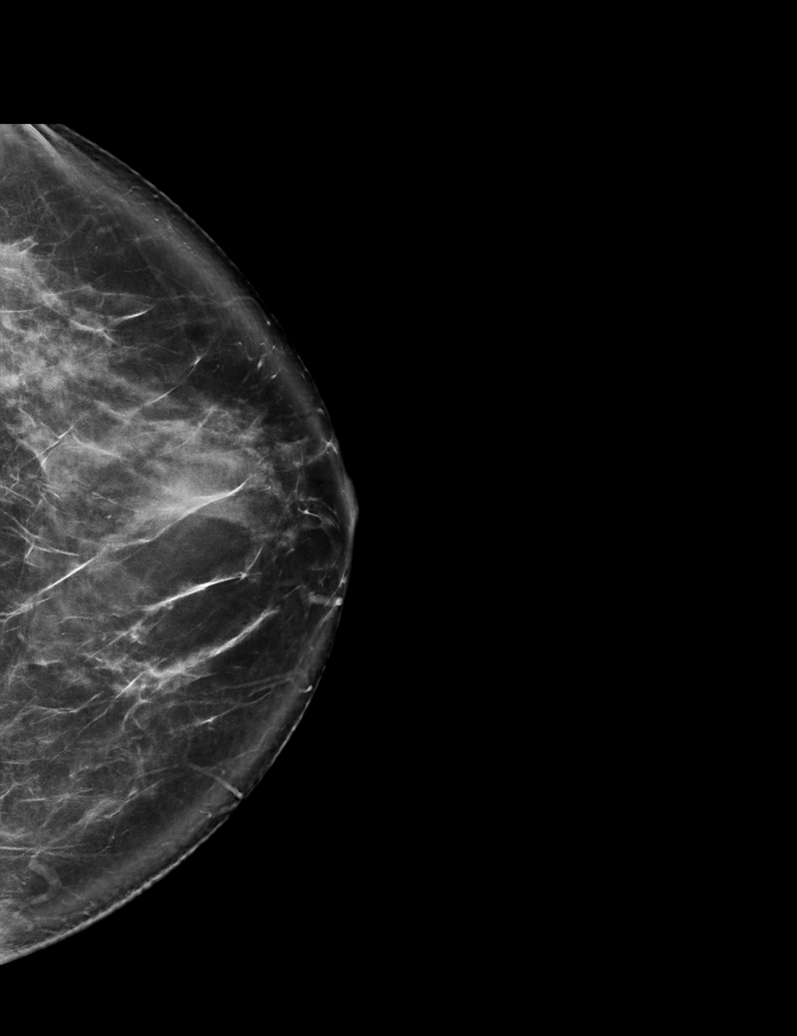

[R CC synth-2D]
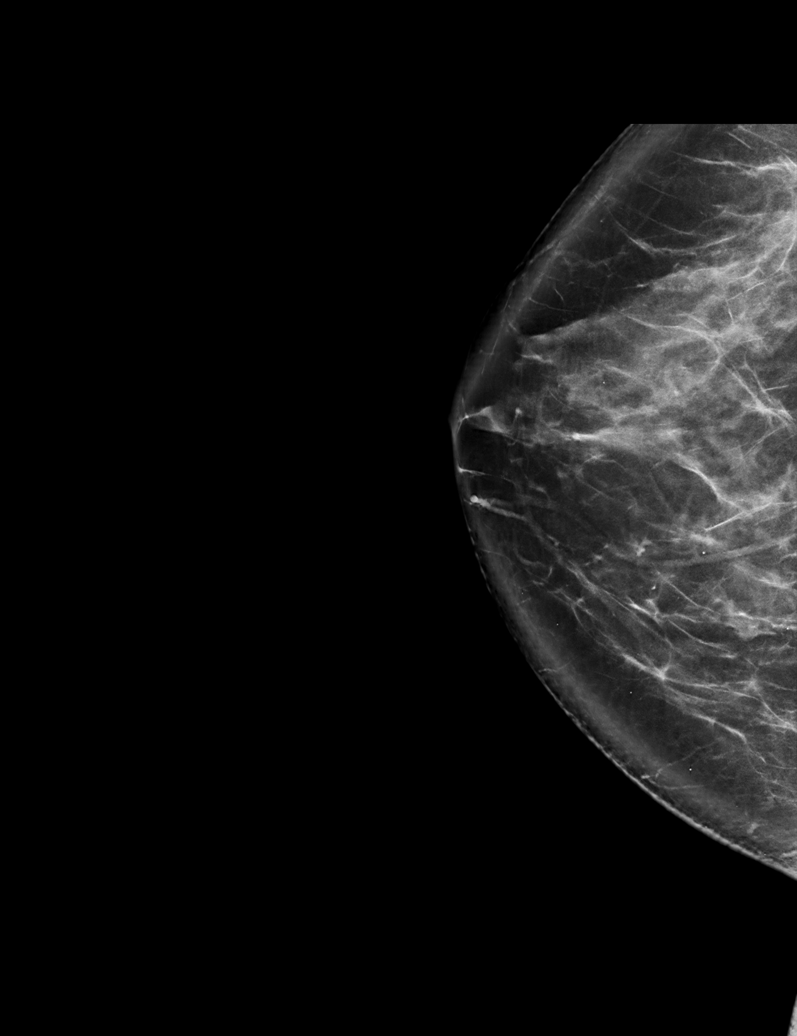

[R MLO synth-2D (1 of 2)]
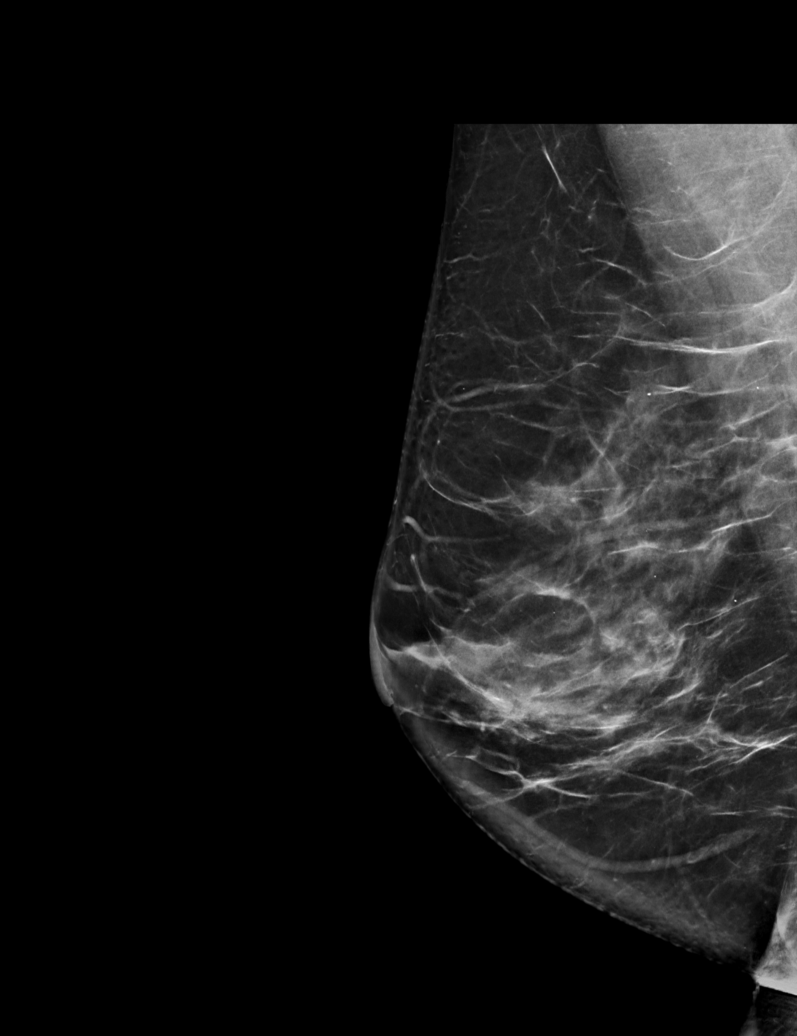

[L MLO synth-2D]
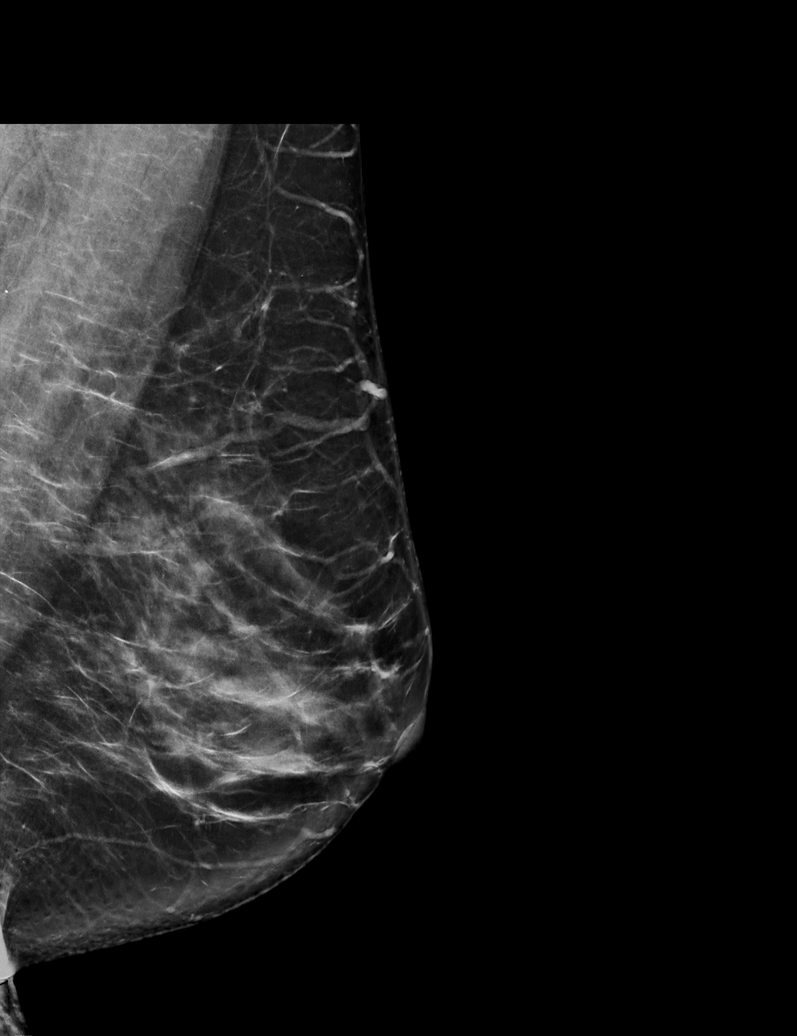

[R MLO synth-2D (2 of 2)]
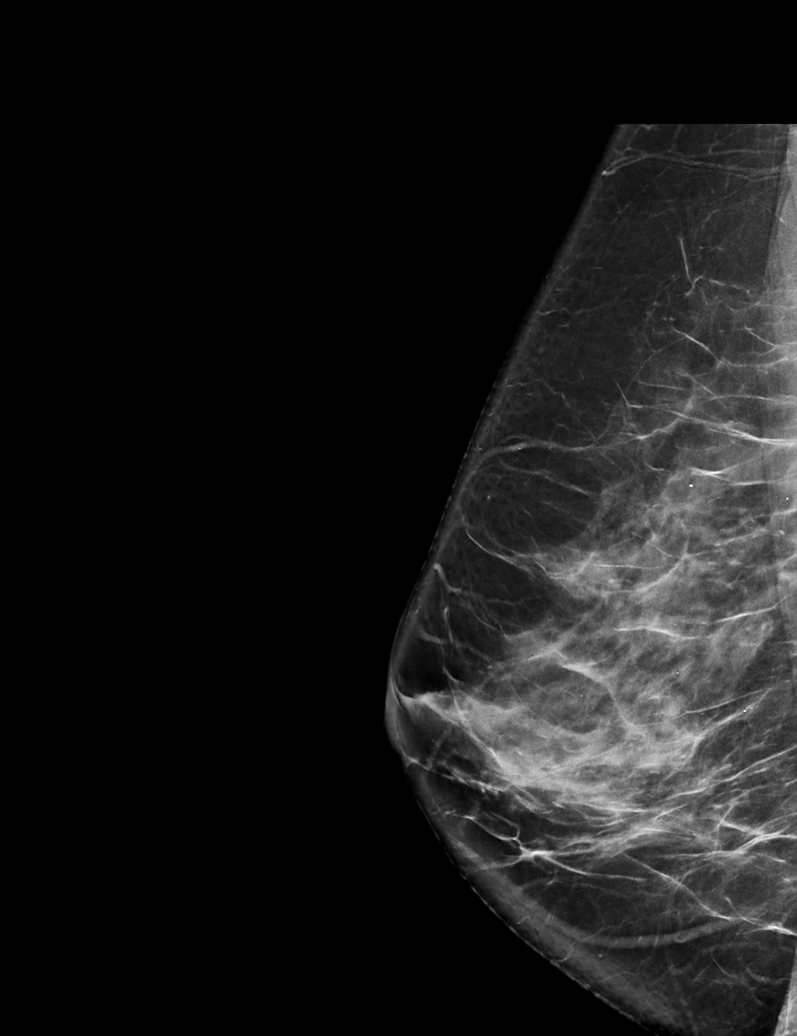

[R MLO tomo · tomo slice 42/83.0]
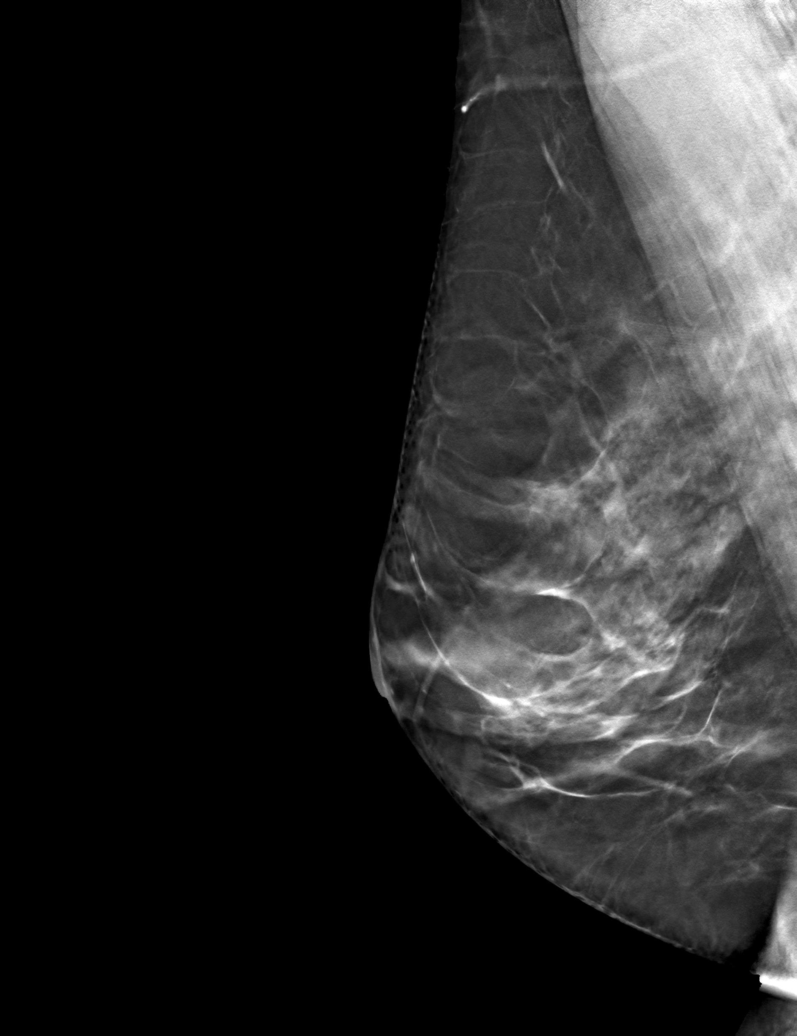

[6 of 30 positions shown; findings below may reference images not displayed]

ACR Breast Density Category c: The breast tissue is heterogeneously
dense, which may obscure small masses.
FINDINGS: There are no findings suspicious for malignancy.
IMPRESSION: No mammographic evidence of malignancy. A result letter of this
screening mammogram will be mailed directly to the patient.

RECOMMENDATION:
Screening mammogram in one year. (Code:Q3-W-BC3)

BI-RADS CATEGORY  1: Negative.

## 2022-10-08 DIAGNOSIS — D229 Melanocytic nevi, unspecified: Secondary | ICD-10-CM | POA: Diagnosis not present

## 2022-10-08 DIAGNOSIS — H6991 Unspecified Eustachian tube disorder, right ear: Secondary | ICD-10-CM | POA: Diagnosis not present

## 2022-10-08 DIAGNOSIS — M25511 Pain in right shoulder: Secondary | ICD-10-CM | POA: Diagnosis not present

## 2022-10-09 DIAGNOSIS — D2261 Melanocytic nevi of right upper limb, including shoulder: Secondary | ICD-10-CM | POA: Diagnosis not present

## 2022-10-09 DIAGNOSIS — L819 Disorder of pigmentation, unspecified: Secondary | ICD-10-CM | POA: Diagnosis not present

## 2022-11-07 NOTE — Progress Notes (Signed)
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 Had tubes in ears, otherwise no ENT surgery.  Celiac Positive- avoids gluten.Denies GERD Office Spirometry 02/10/18-WNL-FEV1 3.70/100%, FEV1 3.13/105%, ratio 1.85, FEF 25-75% 3.76/122%  -----------------------------------------------------------------------------------    11/09/21- 89 year old former smoker followed for Asthma, allergic Rhinitis, Aspirin Allergy, complicated by Celiac disease, Endometriosis, PAD/ [name], DVT/ PE 04/07/18/ Plavix, -Symbicort 160, Proair, Nasonex, Covid vax- 3 Phizer Flu vax-had -----Patient needs refill on her rescue inhaler, overall feeling good ACT score- 24 She and family had had COVID infection in May.  Her illness was mild and resolved without specific treatment.  Since then her breathing has been comfortable.  Medications do help.  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  11/09/22- 46 year old former smoker followed for Asthma, allergic Rhinitis, Aspirin Allergy, complicated by Celiac disease, Endometriosis, PAD/ [name], DVT/ PE 04/07/18/ Plavix, -Symbicort 160, Proair, Nasonex, Covid vax- 3 Phizer, 1 Moderna Flu vax-had ACT score- 25 She resolved COVID infection 2 weeks ago without antiviral therapy.  She was in [place] with a tour group and now feels fully recovered.  No significant asthma or rhinitis exacerbations with current meds.  Asked refills of Symbicort and ProAir. Has been evaluated for palpitation.  ROS-see HPI  + = positive Constitutional:   No-   weight loss, night sweats, fevers, chills, fatigue, lassitude. HEENT:   No-  headaches, difficulty swallowing, tooth/dental problems, sore throat,       No-  sneezing, itching, ear ache, nasal congestion, post nasal drip,  CV:   chest pain, orthopnea, PND, swelling in lower  extremities, anasarca,               dizziness, +palpitations Resp: + shortness of breath with exertion or at rest.               productive cough,  + non-productive cough,  No- coughing up of blood.              No-   change in color of mucus.  + wheezing.   Skin: No-   rash or lesions. GI:  No-   heartburn, indigestion, abdominal pain, nausea, vomiting,  GU:  MS:  No-   joint pain or swelling.   Neuro-     nothing unusual Psych:  No- change in mood or affect. No depression or anxiety.  No memory loss.  OBJ- Physical Exam General- Alert, Oriented, Affect-appropriate, Distress- none acute, well appearing Skin- rash-none, lesions- none, excoriation- none Lymphadenopathy- none Head- atraumatic            Eyes- Gross vision intact, PERRLA, conjunctivae and secretions clear            Ears- Hearing, canals-normal            Nose-  clear, no-Septal dev, mucus, polyps, erosion, perforation             Throat- Mallampati II , mucosa clear , drainage- none, tonsils- atrophic Neck- flexible , trachea midline, no stridor , thyroid nl, carotid no bruit Chest - symmetrical excursion , unlabored           Heart/CV-+ RRR/occasional extra beat, no murmur , no gallop  , no rub, nl s1 s2                           -  JVD- none , edema- none, stasis changes- none, varices- none           Lung- clear to P&A, wheeze- none, cough- none , dullness-none, rub- none           Chest wall-  Abd-  Br/ Gen/ Rectal- Not done, not indicated Extrem- cyanosis- none, clubbing, none, atrophy- none, strength- nl Neuro- grossly intact to observation

## 2022-11-09 ENCOUNTER — Encounter: Payer: Self-pay | Admitting: Internal Medicine

## 2022-11-09 ENCOUNTER — Ambulatory Visit (INDEPENDENT_AMBULATORY_CARE_PROVIDER_SITE_OTHER): Payer: BC Managed Care – PPO | Admitting: Internal Medicine

## 2022-11-09 VITALS — BP 110/68 | HR 59 | Ht 64.0 in | Wt 153.6 lb

## 2022-11-09 DIAGNOSIS — J3089 Other allergic rhinitis: Secondary | ICD-10-CM | POA: Diagnosis not present

## 2022-11-09 DIAGNOSIS — J4520 Mild intermittent asthma, uncomplicated: Secondary | ICD-10-CM | POA: Diagnosis not present

## 2022-11-09 DIAGNOSIS — J302 Other seasonal allergic rhinitis: Secondary | ICD-10-CM

## 2022-11-09 MED ORDER — BUDESONIDE-FORMOTEROL FUMARATE 160-4.5 MCG/ACT IN AERO: INHALATION_SPRAY | RESPIRATORY_TRACT | 4 refills | Status: DC

## 2022-11-09 MED ORDER — ALBUTEROL SULFATE HFA 108 (90 BASE) MCG/ACT IN AERS: INHALATION_SPRAY | RESPIRATORY_TRACT | 4 refills | Status: DC

## 2022-11-09 NOTE — Assessment & Plan Note (Signed)
Steroid and antihistamine sufficient when needed.

## 2022-11-09 NOTE — Assessment & Plan Note (Signed)
Intermittent and uncomplicated, doing well. Plan-refill inhalers.

## 2022-11-09 NOTE — Patient Instructions (Signed)
Meds refilled  Please call if we can help

## 2022-11-27 ENCOUNTER — Ambulatory Visit: Payer: BC Managed Care – PPO | Attending: Family Medicine | Admitting: Physical Therapy

## 2022-11-27 ENCOUNTER — Encounter: Payer: Self-pay | Admitting: Physical Therapy

## 2022-11-27 DIAGNOSIS — G8929 Other chronic pain: Secondary | ICD-10-CM | POA: Diagnosis not present

## 2022-11-27 DIAGNOSIS — M25511 Pain in right shoulder: Secondary | ICD-10-CM | POA: Insufficient documentation

## 2022-11-27 DIAGNOSIS — M6281 Muscle weakness (generalized): Secondary | ICD-10-CM | POA: Diagnosis not present

## 2022-11-27 DIAGNOSIS — R293 Abnormal posture: Secondary | ICD-10-CM | POA: Insufficient documentation

## 2022-11-27 NOTE — Patient Instructions (Signed)

## 2022-11-27 NOTE — Therapy (Signed)
OUTPATIENT PHYSICAL THERAPY SHOULDER EVALUATION   Patient Name: Sierra Olsen MRN: [id number] DOB:[birth year], 47 y.o., female Today's Date: 11/27/2022  END OF SESSION:  PT End of Session - 11/27/22 0852     Visit Number 1    Date for PT Re-Evaluation 01/22/23    Authorization Type BCBS    PT Start Time [phone]    PT Stop Time 0930    PT Time Calculation (min) 41 min    Activity Tolerance Patient tolerated treatment well    Behavior During Therapy [facility] for tasks assessed/performed             Past Medical History:  Diagnosis Date   Allergy    Anxiety    Anxiety    Asthma    Celiac disease    Celiac disease    DVT (deep venous thrombosis) (HCC)    Of iliac vein of LLE   DVT (deep venous thrombosis) ([place]) 01/12/2019   Endometriosis    Endometriosis    Environmental allergies    Epigastric pain    Family history of adverse reaction to anesthesia    SISTER HAD TROUBLE BREATHING   GERD (gastroesophageal reflux disease)    Headache    Hx of migraine headaches    Hx of migraine headaches    Hypoglycemia    Hypoglycemia    Leukocytosis    Low back pain    Nausea    PE (pulmonary thromboembolism) ([place]) 03/2018   PE (pulmonary thromboembolism) (HCC)    PVC (premature ventricular contraction)    Retinal edema    Retinal edema    RUQ abdominal pain    Tinnitus    Tinnitus    Past Surgical History:  Procedure Laterality Date   ABDOMINAL HYSTERECTOMY     DILATION AND CURETTAGE OF UTERUS     INTRAVASCULAR ULTRASOUND/IVUS N/A 01/12/2019   Procedure: INTRAVASCULAR ULTRASOUND/IVUS;  Surgeon: Waynetta Sandy, MD;  Location: [place] [facility];  Service: Cardiovascular;  Laterality: N/A;   LOWER EXTREMITY VENOGRAPHY Left 01/12/2019   Procedure: LOWER EXTREMITY VENOGRAPHY;  Surgeon: Waynetta Sandy, MD;  Location: [place] [facility];  Service: Cardiovascular;  Laterality: Left;   PERIPHERAL VASCULAR INTERVENTION Left 01/12/2019   Procedure: PERIPHERAL  VASCULAR INTERVENTION;  Surgeon: Waynetta Sandy, MD;  Location: [place] [facility];  Service: Cardiovascular;  Laterality: Left;  common and external iliac venous Ivus and stent   tubes in ears     age 76   WISDOM TOOTH EXTRACTION     Patient Active Problem List   Diagnosis Date Noted   Palpitations 05/25/2022   DVT (deep venous thrombosis) ([place]) 01/12/2019   PE (pulmonary thromboembolism) ([place]) 04/08/2018   Endometriosis 04/07/2018   Chest pain of uncertain etiology [id number]   Asthma, mild intermittent, well-controlled 03/24/2014   Seasonal and perennial allergic rhinitis 03/24/2014    PCP: Marda Stalker, PA-C  REFERRING PROVIDER: Marda Stalker, PA-C  REFERRING DIAG: M25.511 (ICD-10-CM) - Pain in right shoulder  THERAPY DIAG:  Chronic right shoulder pain - Plan: PT plan of care cert/re-cert  Abnormal posture - Plan: PT plan of care cert/re-cert  Muscle weakness (generalized) - Plan: PT plan of care cert/re-cert  Rationale for Evaluation and Treatment: Rehabilitation  ONSET DATE: 5 years on/off, flared Sept 2023  SUBJECTIVE:  SUBJECTIVE STATEMENT: Pain in Rt shoulder flared a few months ago, has hurt on/off x 5 years.  Can radiate to elbow and hurts to extend elbow all the way.  Pain with sleeping on Rt side and now hurts laying on back too. Pain with weight bearing and sometimes with lifting.  Pain with exercise and rotating shoulder.  It grinds. Pt is Rt handed.  PERTINENT HISTORY: PMH: Plavix with Hx of DVT/PE, asthma, allergies, LBP  PAIN:  PAIN:  Are you having pain? Yes NPRS scale: 1-5/10 Pain location: Rt shoulder to elbow Pain orientation: Right  PAIN TYPE: deep Pain description: intermittent  Aggravating factors: sleeping, exercise, lifting, weight bearing,  rolling into shoulder circles Relieving factors: rest, holding arm at side   PRECAUTIONS: None  WEIGHT BEARING RESTRICTIONS: No  FALLS:  Has patient fallen in last 6 months? No   OCCUPATION: Full time, priest  PLOF: Independent  PATIENT GOALS: pain reduction, more control of symptoms, continue free weights  NEXT MD VISIT: as needed  OBJECTIVE:   DIAGNOSTIC FINDINGS:  none  PATIENT SURVEYS:  FOTO 75% goal 76%  COGNITION: Overall cognitive status: Within functional limits for tasks assessed     SENSATION: WFL  POSTURE: Rt scapula abducted from spine, slightly rounded  UPPER EXTREMITY ROM:   Pt has full ROM in all planes bil   UPPER EXTREMITY MMT:  MMT Right eval Left eval  Shoulder flexion 4 5  Shoulder extension    Shoulder abduction 4 5  Shoulder adduction    Shoulder internal rotation    Shoulder external rotation    Middle trapezius 4/ 4  Lower trapezius 4- 4  Elbow flexion    Elbow extension    Wrist flexion    Wrist extension    Wrist ulnar deviation    Wrist radial deviation    Wrist pronation    Wrist supination    Grip strength (lbs)    (Blank rows = not tested)  SHOULDER SPECIAL TESTS: Impingement tests: Hawkins/Kennedy impingement test: positive  and Painful arc test: negative SLAP lesions: Clunk test: negative Instability tests: Apprehension test: negative Rotator cuff assessment: Empty can test: negative Biceps assessment: Speed's test: negative  JOINT MOBILITY TESTING:  Slight stiffness present for posterior and inferior glides Rt GH joint Limited ribcage springing on Rt T3-T7  PALPATION:  Signif increase in resting tone with TP present: Rt upper trap, levator infraspinatus, teres minor and major, posterior deltoid, lat, rhomboids   TODAY'S TREATMENT:                                                                                                                                         DATE: 11/27/22 Intro to DN with  handout Initiated HEP Discussion of finding and plan (release, then train)   PATIENT EDUCATION: Education details: HEP and DN handout Person educated: Patient Education method: Explanation, Demonstration, Verbal cues, and Handouts  Education comprehension: verbalized understanding and returned demonstration  HOME EXERCISE PROGRAM: Access Code: [email or web address] URL: [email or web address] Date: 11/27/2022 Prepared by: Venetia Night Mechille Varghese  Exercises - Seated Upper Trapezius Stretch  - 1 x daily - 7 x weekly - 1 sets - 2 reps - 20 hold - Seated Levator Scapulae Stretch  - 1 x daily - 7 x weekly - 1 sets - 2 reps - 20 hold - Seated Rhomboid Stretch  - 1 x daily - 7 x weekly - 1 sets - 2 reps - 20 hold  ASSESSMENT:  CLINICAL IMPRESSION: Patient is a 47 y.o. female who was seen today for physical therapy evaluation and treatment for Rt shoulder pain.  Pain has been intermittent x 5 years without injury and worsened in Sept 2023.  She reports pain is deep and she reports it grinds.  She has full ROM of Rt shoulder and strength of 4-5/5 throughout Rt UE.  She does not have any dramatically positive special tests suggesting specific damage or injury to shoulder structures.  She has significant muscle tension around posterior shoulder so as those are released some ongoing assessment of deep shoulder pain source will be important.  Muscle tightness is affecting resting posture and mechanics of Rt upper quadrant. She will benefit from manual techniques to release posterior shoulder, intrascapular region and cervical soft tissue with strength training to uptrain lower/middle trap and RC.    OBJECTIVE IMPAIRMENTS: decreased coordination, decreased strength, increased muscle spasms, impaired flexibility, postural dysfunction, and pain.   ACTIVITY LIMITATIONS: lifting, sleeping, reach over head, and exercise  PARTICIPATION LIMITATIONS: cleaning, laundry, community activity, and exercise  PERSONAL  FACTORS: Time since onset of injury/illness/exacerbation are also affecting patient's functional outcome.   REHAB POTENTIAL: Excellent  CLINICAL DECISION MAKING: Stable/uncomplicated  EVALUATION COMPLEXITY: Low   GOALS: Goals reviewed with patient? Yes  SHORT TERM GOALS: Target date: 12/25/22  Pt will be ind with initial HEP Baseline: Goal status: INITIAL  2.  Pt will report at least 20% less pain with functional use of Rt UE. Baseline:  Goal status: INITIAL  3.  Pt will achieve reduced tightness and trigger point presence by at least 50% along posterior shoulder. Baseline:  Goal status: INITIAL  4.  Pt will report improved sleep by at least 20% Baseline:  Goal status: INITIAL    LONG TERM GOALS: Target date: 01/22/23  Pt will be ind with advanced HEP and understand how to avoid exacerbation of symptoms. Baseline:  Goal status: INITIAL  2.  Pt will improve FOTO score to at least 76% to demo improved function. Baseline: 75% Goal status: INITIAL  3.  Pt will be able to sleep on Rt side and back without pain Baseline:  Goal status: INITIAL  4.  Pt will report improved Rt shoulder pain by at least 70% with daily activities Baseline:  Goal status: INITIAL  5.  Pt will be able to perform functional reaching and lifting tasks with Rt UE without pain Baseline:  Goal status: INITIAL    PLAN:  PT FREQUENCY: 1-2x/week  PT DURATION: 8 weeks  PLANNED INTERVENTIONS: Therapeutic exercises, Therapeutic activity, Neuromuscular re-education, Patient/Family education, Self Care, Joint mobilization, Dry Needling, Electrical stimulation, Spinal mobilization, Cryotherapy, Moist heat, Taping, Vasopneumatic device, Ionotophoresis 4mg /ml Dexamethasone, and Manual therapy  PLAN FOR NEXT SESSION: UBE, review HEP, DN if seeing PT, manual techniques to address tension and TP in Rt upper trap, posterior shoulder, thoracic ext over chair and open books for thoracic mobility, tband for  row  Kinslee Dalpe, PT 11/27/22 12:23 PM

## 2022-11-30 ENCOUNTER — Other Ambulatory Visit: Payer: Self-pay | Admitting: Family Medicine

## 2022-11-30 DIAGNOSIS — Z1231 Encounter for screening mammogram for malignant neoplasm of breast: Secondary | ICD-10-CM

## 2022-12-03 ENCOUNTER — Ambulatory Visit: Payer: BC Managed Care – PPO | Admitting: Physical Therapy

## 2022-12-03 ENCOUNTER — Encounter: Payer: Self-pay | Admitting: Physical Therapy

## 2022-12-03 DIAGNOSIS — R293 Abnormal posture: Secondary | ICD-10-CM | POA: Diagnosis not present

## 2022-12-03 DIAGNOSIS — G8929 Other chronic pain: Secondary | ICD-10-CM | POA: Diagnosis not present

## 2022-12-03 DIAGNOSIS — M25511 Pain in right shoulder: Secondary | ICD-10-CM | POA: Diagnosis not present

## 2022-12-03 DIAGNOSIS — M6281 Muscle weakness (generalized): Secondary | ICD-10-CM | POA: Diagnosis not present

## 2022-12-03 NOTE — Therapy (Signed)
OUTPATIENT PHYSICAL THERAPY TREATMENT NOTE   Patient Name: Sierra Olsen MRN: [id number] DOB:13-May-1976, 47 y.o., female Today's Date: 12/03/2022  PCP: Marda Stalker, PA-C  REFERRING PROVIDER: Marda Stalker, PA-C   END Of SESSION:   PT End of Session - 12/03/22 0845     Visit Number 2    Date for PT Re-Evaluation 01/22/23    Authorization Type BCBS    PT Start Time 0845    PT Stop Time 0926    PT Time Calculation (min) 41 min    Activity Tolerance Patient tolerated treatment well    Behavior During Therapy [facility] for tasks assessed/performed             Past Medical History:  Diagnosis Date   Allergy    Anxiety    Anxiety    Asthma    Celiac disease    Celiac disease    DVT (deep venous thrombosis) (HCC)    Of iliac vein of LLE   DVT (deep venous thrombosis) ([place]) 01/12/2019   Endometriosis    Endometriosis    Environmental allergies    Epigastric pain    Family history of adverse reaction to anesthesia    SISTER HAD TROUBLE BREATHING   GERD (gastroesophageal reflux disease)    Headache    Hx of migraine headaches    Hx of migraine headaches    Hypoglycemia    Hypoglycemia    Leukocytosis    Low back pain    Nausea    PE (pulmonary thromboembolism) ([place]) 03/2018   PE (pulmonary thromboembolism) ([place])    PVC (premature ventricular contraction)    Retinal edema    Retinal edema    RUQ abdominal pain    Tinnitus    Tinnitus    Past Surgical History:  Procedure Laterality Date   ABDOMINAL HYSTERECTOMY     DILATION AND CURETTAGE OF UTERUS     INTRAVASCULAR ULTRASOUND/IVUS N/A 01/12/2019   Procedure: INTRAVASCULAR ULTRASOUND/IVUS;  Surgeon: Waynetta Sandy, MD;  Location: [place] [facility];  Service: Cardiovascular;  Laterality: N/A;   LOWER EXTREMITY VENOGRAPHY Left 01/12/2019   Procedure: LOWER EXTREMITY VENOGRAPHY;  Surgeon: Waynetta Sandy, MD;  Location: [place] [facility];  Service: Cardiovascular;  Laterality: Left;    PERIPHERAL VASCULAR INTERVENTION Left 01/12/2019   Procedure: PERIPHERAL VASCULAR INTERVENTION;  Surgeon: Waynetta Sandy, MD;  Location: [place] [facility];  Service: Cardiovascular;  Laterality: Left;  common and external iliac venous Ivus and stent   tubes in ears     age 30   WISDOM TOOTH EXTRACTION     Patient Active Problem List   Diagnosis Date Noted   Palpitations 05/25/2022   DVT (deep venous thrombosis) ([place]) 01/12/2019   PE (pulmonary thromboembolism) ([place]) 04/08/2018   Endometriosis 04/07/2018   Chest pain of uncertain etiology [id number]   Asthma, mild intermittent, well-controlled 03/24/2014   Seasonal and perennial allergic rhinitis 03/24/2014    REFERRING DIAG: M25.511 (ICD-10-CM) - Pain in right shoulder   THERAPY DIAG:  Chronic right shoulder pain  Abnormal posture  Muscle weakness (generalized)  Rationale for Evaluation and Treatment Rehabilitation  PERTINENT HISTORY: PMH: Plavix with Hx of DVT/PE, asthma, allergies, LBP   PRECAUTIONS: None   SUBJECTIVE:  SUBJECTIVE STATEMENT:  Doing my HEP but I have questions.    PAIN:  Are you having pain? Yes: NPRS scale: 3/10 Pain location: Shoulder Pain description: Sore Aggravating factors: Sleeping on it Relieving factors: Not sure, rest   OBJECTIVE: (objective measures completed at initial evaluation unless otherwise dated)  DIAGNOSTIC FINDINGS:  none   PATIENT SURVEYS:  FOTO 75% goal 76%   COGNITION: Overall cognitive status: Within functional limits for tasks assessed                                  SENSATION: WFL   POSTURE: Rt scapula abducted from spine, slightly rounded   UPPER EXTREMITY ROM:    Pt has full ROM in all planes bil     UPPER EXTREMITY MMT:   MMT Right eval Left eval  Shoulder  flexion 4 5  Shoulder extension      Shoulder abduction 4 5  Shoulder adduction      Shoulder internal rotation      Shoulder external rotation      Middle trapezius 4/ 4  Lower trapezius 4- 4  Elbow flexion      Elbow extension      Wrist flexion      Wrist extension      Wrist ulnar deviation      Wrist radial deviation      Wrist pronation      Wrist supination      Grip strength (lbs)      (Blank rows = not tested)   SHOULDER SPECIAL TESTS: Impingement tests: Hawkins/Kennedy impingement test: positive  and Painful arc test: negative SLAP lesions: Clunk test: negative Instability tests: Apprehension test: negative Rotator cuff assessment: Empty can test: negative Biceps assessment: Speed's test: negative   JOINT MOBILITY TESTING:  Slight stiffness present for posterior and inferior glides Rt GH joint Limited ribcage springing on Rt T3-T7   PALPATION:  Signif increase in resting tone with TP present: Rt upper trap, levator infraspinatus, teres minor and major, posterior deltoid, lat, rhomboids             TODAY'S TREATMENT:   12/03/21: Arm bike L1 2x2 with PTA present to discuss current status Seated RT Upper trap stretch 3x 3 slow breaths, VC to pay attention to her posture and stay soft in her neck. Seated levator stretch as above Seated rhomboid stretch as above Sidelying open book: 10x Bil, some pain stretching with the Rt arm in long lever position Quadruped Yoga thread the needle stretch RTUE Standing yellow tband lat pull with 3 sec hold to increase "feel" of her mid back muscles Bank robbers facing wall 5x2, VC/TC to feel mid back muscles awakening and keeping upper traps relaxed  DATE: 11/27/22 Intro to DN with handout Initiated HEP Discussion of finding and plan (release, then train)     PATIENT EDUCATION: Education  details: HEP and DN handout Person educated: Patient Education method: Explanation, Demonstration, Verbal cues, and Handouts Education comprehension: verbalized understanding and returned demonstration   HOME EXERCISE PROGRAM: Access Code: [email or web address] URL: [email or web address] Date: 11/27/2022 Prepared by: Venetia Night Beuhring   Exercises - Seated Upper Trapezius Stretch  - 1 x daily - 7 x weekly - 1 sets - 2 reps - 20 hold - Seated Levator Scapulae Stretch  - 1 x daily - 7 x weekly - 1 sets - 2 reps - 20 hold - Seated Rhomboid Stretch  - 1 x daily - 7 x weekly - 1 sets - 2 reps - 20 hold   ASSESSMENT:   CLINICAL IMPRESSION: Pt arrives to PT with some light Rt shoulder soreness from awakening. Reviewed HEP, Pt needing minor verbal cuing for technique on stretches. Pt participated in a few exercises to increase neuromuscular awareness of mid back.   OBJECTIVE IMPAIRMENTS: decreased coordination, decreased strength, increased muscle spasms, impaired flexibility, postural dysfunction, and pain.    ACTIVITY LIMITATIONS: lifting, sleeping, reach over head, and exercise   PARTICIPATION LIMITATIONS: cleaning, laundry, community activity, and exercise   PERSONAL FACTORS: Time since onset of injury/illness/exacerbation are also affecting patient's functional outcome.    REHAB POTENTIAL: Excellent   CLINICAL DECISION MAKING: Stable/uncomplicated   EVALUATION COMPLEXITY: Low     GOALS: Goals reviewed with patient? Yes   SHORT TERM GOALS: Target date: 12/25/22   Pt will be ind with initial HEP Baseline: Goal status: INITIAL   2.  Pt will report at least 20% less pain with functional use of Rt UE. Baseline:  Goal status: INITIAL   3.  Pt will achieve reduced tightness and trigger point presence by at least 50% along posterior shoulder. Baseline:  Goal status: INITIAL   4.  Pt will report improved sleep by at least 20% Baseline:  Goal status: INITIAL       LONG TERM  GOALS: Target date: 01/22/23   Pt will be ind with advanced HEP and understand how to avoid exacerbation of symptoms. Baseline:  Goal status: INITIAL   2.  Pt will improve FOTO score to at least 76% to demo improved function. Baseline: 75% Goal status: INITIAL   3.  Pt will be able to sleep on Rt side and back without pain Baseline:  Goal status: INITIAL   4.  Pt will report improved Rt shoulder pain by at least 70% with daily activities Baseline:  Goal status: INITIAL   5.  Pt will be able to perform functional reaching and lifting tasks with Rt UE without pain Baseline:  Goal status: INITIAL       PLAN:   PT FREQUENCY: 1-2x/week   PT DURATION: 8 weeks   PLANNED INTERVENTIONS: Therapeutic exercises, Therapeutic activity, Neuromuscular re-education, Patient/Family education, Self Care, Joint mobilization, Dry Needling, Electrical stimulation, Spinal mobilization, Cryotherapy, Moist heat, Taping, Vasopneumatic device, Ionotophoresis 4mg /ml Dexamethasone, and Manual therapy   PLAN FOR NEXT SESSION: Pt would like to try dry needling next session: review thread the needle stretch we could not find one on [place], PTA 12/03/2022, 9:27 AM

## 2022-12-06 ENCOUNTER — Ambulatory Visit: Payer: BC Managed Care – PPO | Admitting: Physical Therapy

## 2022-12-06 DIAGNOSIS — G8929 Other chronic pain: Secondary | ICD-10-CM | POA: Diagnosis not present

## 2022-12-06 DIAGNOSIS — M25511 Pain in right shoulder: Secondary | ICD-10-CM | POA: Diagnosis not present

## 2022-12-06 DIAGNOSIS — R293 Abnormal posture: Secondary | ICD-10-CM | POA: Diagnosis not present

## 2022-12-06 DIAGNOSIS — M6281 Muscle weakness (generalized): Secondary | ICD-10-CM

## 2022-12-06 NOTE — Therapy (Signed)
OUTPATIENT PHYSICAL THERAPY TREATMENT NOTE   Patient Name: Sierra Olsen MRN: [id number] DOB:1976/06/29, 47 y.o., female Today's Date: 12/06/2022  PCP: Jarrett Soho, PA-C  REFERRING PROVIDER: Jarrett Soho, PA-C   END Of SESSION:   PT End of Session - 12/06/22 1449     Visit Number 3    Date for PT Re-Evaluation 01/22/23    Authorization Type BCBS    PT Start Time 1404    PT Stop Time 1446    PT Time Calculation (min) 42 min    Activity Tolerance Patient tolerated treatment well    Behavior During Therapy WFL for tasks assessed/performed              Past Medical History:  Diagnosis Date   Allergy    Anxiety    Anxiety    Asthma    Celiac disease    Celiac disease    DVT (deep venous thrombosis) (HCC)    Of iliac vein of LLE   DVT (deep venous thrombosis) (HCC) 01/12/2019   Endometriosis    Endometriosis    Environmental allergies    Epigastric pain    Family history of adverse reaction to anesthesia    SISTER HAD TROUBLE BREATHING   GERD (gastroesophageal reflux disease)    Headache    Hx of migraine headaches    Hx of migraine headaches    Hypoglycemia    Hypoglycemia    Leukocytosis    Low back pain    Nausea    PE (pulmonary thromboembolism) (HCC) 03/2018   PE (pulmonary thromboembolism) (HCC)    PVC (premature ventricular contraction)    Retinal edema    Retinal edema    RUQ abdominal pain    Tinnitus    Tinnitus    Past Surgical History:  Procedure Laterality Date   ABDOMINAL HYSTERECTOMY     DILATION AND CURETTAGE OF UTERUS     INTRAVASCULAR ULTRASOUND/IVUS N/A 01/12/2019   Procedure: INTRAVASCULAR ULTRASOUND/IVUS;  Surgeon: Maeola Harman, MD;  Location: [facility] INVASIVE CV LAB;  Service: Cardiovascular;  Laterality: N/A;   LOWER EXTREMITY VENOGRAPHY Left 01/12/2019   Procedure: LOWER EXTREMITY VENOGRAPHY;  Surgeon: Maeola Harman, MD;  Location: [facility] INVASIVE CV LAB;  Service: Cardiovascular;  Laterality: Left;    PERIPHERAL VASCULAR INTERVENTION Left 01/12/2019   Procedure: PERIPHERAL VASCULAR INTERVENTION;  Surgeon: Maeola Harman, MD;  Location: [facility] INVASIVE CV LAB;  Service: Cardiovascular;  Laterality: Left;  common and external iliac venous Ivus and stent   tubes in ears     age 54   WISDOM TOOTH EXTRACTION     Patient Active Problem List   Diagnosis Date Noted   Palpitations 05/25/2022   DVT (deep venous thrombosis) (HCC) 01/12/2019   PE (pulmonary thromboembolism) (HCC) 04/08/2018   Endometriosis 04/07/2018   Chest pain of uncertain etiology 04/09/2017   Asthma, mild intermittent, well-controlled 03/24/2014   Seasonal and perennial allergic rhinitis 03/24/2014    REFERRING DIAG: M25.511 (ICD-10-CM) - Pain in right shoulder   THERAPY DIAG:  Chronic right shoulder pain  Abnormal posture  Muscle weakness (generalized)  Rationale for Evaluation and Treatment Rehabilitation  PERTINENT HISTORY: PMH: Plavix with Hx of DVT/PE, asthma, allergies, LBP   PRECAUTIONS: None   SUBJECTIVE:  SUBJECTIVE STATEMENT:  I was sore after last session, but more of a muscle fatigue.    PAIN:  Are you having pain? Yes: NPRS scale: 3/10 Pain location: Shoulder Pain description: Sore Aggravating factors: Sleeping on it Relieving factors: Not sure, rest   OBJECTIVE: (objective measures completed at initial evaluation unless otherwise dated)  DIAGNOSTIC FINDINGS:  none   PATIENT SURVEYS:  FOTO 75% goal 76%   COGNITION: Overall cognitive status: Within functional limits for tasks assessed                                  SENSATION: WFL   POSTURE: Rt scapula abducted from spine, slightly rounded   UPPER EXTREMITY ROM:    Pt has full ROM in all planes bil     UPPER EXTREMITY MMT:   MMT  Right eval Left eval  Shoulder flexion 4 5  Shoulder extension      Shoulder abduction 4 5  Shoulder adduction      Shoulder internal rotation      Shoulder external rotation      Middle trapezius 4/ 4  Lower trapezius 4- 4  Elbow flexion      Elbow extension      Wrist flexion      Wrist extension      Wrist ulnar deviation      Wrist radial deviation      Wrist pronation      Wrist supination      Grip strength (lbs)      (Blank rows = not tested)   SHOULDER SPECIAL TESTS: Impingement tests: Hawkins/Kennedy impingement test: positive  and Painful arc test: negative SLAP lesions: Clunk test: negative Instability tests: Apprehension test: negative Rotator cuff assessment: Empty can test: negative Biceps assessment: Speed's test: negative   JOINT MOBILITY TESTING:  Slight stiffness present for posterior and inferior glides Rt GH joint Limited ribcage springing on Rt T3-T7   PALPATION:  Signif increase in resting tone with TP present: Rt upper trap, levator infraspinatus, teres minor and major, posterior deltoid, lat, rhomboids             TODAY'S TREATMENT:  12/06/2022 Arm bike L1 2x2 with PTA present to discuss current status Trigger Point Dry-Needling  Treatment instructions: Expect mild to moderate muscle soreness. S/S of pneumothorax if dry needled over a lung field, and to seek immediate medical attention should they occur. Patient verbalized understanding of these instructions and education.  Patient Consent Given: Yes Education handout provided: Yes Muscles treated: R thoracic paraspinals  Electrical stimulation performed: No Parameters: N/A Treatment response/outcome: Pt reports twitches with muscles twitches.  Prone scap retraction x10 with 5 sec hold.  Seated RT Upper trap stretch 3x 3 slow breaths, VC to pay attention to her posture and stay soft in her neck. Seated levator stretch as above Seated rhomboid stretch/ doorway stretch Standing open book: 10x  Bil,  Standing yellow tband lat pull/ rows   12/03/21: Arm bike L1 2x2 with PTA present to discuss current status Seated RT Upper trap stretch 3x 3 slow breaths, VC to pay attention to her posture and stay soft in her neck. Seated levator stretch as above Seated rhomboid stretch as above Sidelying open book: 10x Bil, some pain stretching with the Rt arm in long lever position Quadruped Yoga thread the needle stretch RTUE Standing yellow tband lat pull with 3 sec hold to increase "feel" of her  mid back muscles Bank robbers facing wall 5x2, VC/TC to feel mid back muscles awakening and keeping upper traps relaxed                                                                                                                                           DATE: 11/27/22 Intro to DN with handout Initiated HEP Discussion of finding and plan (release, then train)     PATIENT EDUCATION: Education details: HEP and DN handout Person educated: Patient Education method: Consulting civil engineer, Demonstration, Verbal cues, and Handouts Education comprehension: verbalized understanding and returned demonstration   HOME EXERCISE PROGRAM: Access Code: [email or web address] URL: [email or web address] Date: 11/27/2022 Prepared by: Venetia Night Beuhring   Exercises - Seated Upper Trapezius Stretch  - 1 x daily - 7 x weekly - 1 sets - 2 reps - 20 hold - Seated Levator Scapulae Stretch  - 1 x daily - 7 x weekly - 1 sets - 2 reps - 20 hold - Seated Rhomboid Stretch  - 1 x daily - 7 x weekly - 1 sets - 2 reps - 20 hold   ASSESSMENT:   CLINICAL IMPRESSION: Pt arrives to PT with no current pain, but reports fatigue after last session. TPDN to thoracic paraspinals today to continued knot reported in back. Pt responded very well with no adverse effects or pain following. She will benefit from continued DN to rhomboids and UT's. Added in a few exercises targeting paraspinals with reports of fatigue and noted overuse of UT's with  rows today. Pt responds well to verbal cues. Pt will continue to benefit from skilled PT to address continued deficits.     OBJECTIVE IMPAIRMENTS: decreased coordination, decreased strength, increased muscle spasms, impaired flexibility, postural dysfunction, and pain.    ACTIVITY LIMITATIONS: lifting, sleeping, reach over head, and exercise   PARTICIPATION LIMITATIONS: cleaning, laundry, community activity, and exercise   PERSONAL FACTORS: Time since onset of injury/illness/exacerbation are also affecting patient's functional outcome.    REHAB POTENTIAL: Excellent   CLINICAL DECISION MAKING: Stable/uncomplicated   EVALUATION COMPLEXITY: Low     GOALS: Goals reviewed with patient? Yes   SHORT TERM GOALS: Target date: 12/25/22   Pt will be ind with initial HEP Baseline: Goal status: INITIAL   2.  Pt will report at least 20% less pain with functional use of Rt UE. Baseline:  Goal status: INITIAL   3.  Pt will achieve reduced tightness and trigger point presence by at least 50% along posterior shoulder. Baseline:  Goal status: INITIAL   4.  Pt will report improved sleep by at least 20% Baseline:  Goal status: INITIAL       LONG TERM GOALS: Target date: 01/22/23   Pt will be ind with advanced HEP and understand how to avoid exacerbation of symptoms. Baseline:  Goal status: INITIAL   2.  Pt will improve FOTO score to at least 76%  to demo improved function. Baseline: 75% Goal status: INITIAL   3.  Pt will be able to sleep on Rt side and back without pain Baseline:  Goal status: INITIAL   4.  Pt will report improved Rt shoulder pain by at least 70% with daily activities Baseline:  Goal status: INITIAL   5.  Pt will be able to perform functional reaching and lifting tasks with Rt UE without pain Baseline:  Goal status: INITIAL       PLAN:   PT FREQUENCY: 1-2x/week   PT DURATION: 8 weeks   PLANNED INTERVENTIONS: Therapeutic exercises, Therapeutic activity,  Neuromuscular re-education, Patient/Family education, Self Care, Joint mobilization, Dry Needling, Electrical stimulation, Spinal mobilization, Cryotherapy, Moist heat, Taping, Vasopneumatic device, Ionotophoresis 4mg /ml Dexamethasone, and Manual therapy   PLAN FOR NEXT SESSION: Continue with TPDN and strengthening.    , PT 12/06/2022, 2:50 PM

## 2022-12-07 DIAGNOSIS — J310 Chronic rhinitis: Secondary | ICD-10-CM | POA: Diagnosis not present

## 2022-12-07 DIAGNOSIS — J342 Deviated nasal septum: Secondary | ICD-10-CM | POA: Diagnosis not present

## 2022-12-07 DIAGNOSIS — H9201 Otalgia, right ear: Secondary | ICD-10-CM | POA: Diagnosis not present

## 2022-12-07 DIAGNOSIS — J343 Hypertrophy of nasal turbinates: Secondary | ICD-10-CM | POA: Diagnosis not present

## 2022-12-07 DIAGNOSIS — H6981 Other specified disorders of Eustachian tube, right ear: Secondary | ICD-10-CM | POA: Diagnosis not present

## 2022-12-10 ENCOUNTER — Encounter: Payer: Self-pay | Admitting: Physical Therapy

## 2022-12-10 ENCOUNTER — Ambulatory Visit: Payer: BC Managed Care – PPO | Admitting: Physical Therapy

## 2022-12-10 DIAGNOSIS — R293 Abnormal posture: Secondary | ICD-10-CM

## 2022-12-10 DIAGNOSIS — M25511 Pain in right shoulder: Secondary | ICD-10-CM | POA: Diagnosis not present

## 2022-12-10 DIAGNOSIS — G8929 Other chronic pain: Secondary | ICD-10-CM

## 2022-12-10 DIAGNOSIS — M6281 Muscle weakness (generalized): Secondary | ICD-10-CM | POA: Diagnosis not present

## 2022-12-10 NOTE — Therapy (Signed)
OUTPATIENT PHYSICAL THERAPY TREATMENT NOTE   Patient Name: Sierra Olsen MRN: [id number] DOB:1976-06-13, 47 y.o., female Today's Date: 12/10/2022  PCP: Marda Stalker, PA-C  REFERRING PROVIDER: Marda Stalker, PA-C   END Of SESSION:   PT End of Session - 12/10/22 1105     Visit Number 4    Date for PT Re-Evaluation 01/22/23    Authorization Type BCBS    PT Start Time 1105    PT Stop Time 1140    PT Time Calculation (min) 35 min    Activity Tolerance Patient tolerated treatment well    Behavior During Therapy WFL for tasks assessed/performed               Past Medical History:  Diagnosis Date   Allergy    Anxiety    Anxiety    Asthma    Celiac disease    Celiac disease    DVT (deep venous thrombosis) (HCC)    Of iliac vein of LLE   DVT (deep venous thrombosis) ([place]) 01/12/2019   Endometriosis    Endometriosis    Environmental allergies    Epigastric pain    Family history of adverse reaction to anesthesia    SISTER HAD TROUBLE BREATHING   GERD (gastroesophageal reflux disease)    Headache    Hx of migraine headaches    Hx of migraine headaches    Hypoglycemia    Hypoglycemia    Leukocytosis    Low back pain    Nausea    PE (pulmonary thromboembolism) ([place]) 03/2018   PE (pulmonary thromboembolism) (HCC)    PVC (premature ventricular contraction)    Retinal edema    Retinal edema    RUQ abdominal pain    Tinnitus    Tinnitus    Past Surgical History:  Procedure Laterality Date   ABDOMINAL HYSTERECTOMY     DILATION AND CURETTAGE OF UTERUS     INTRAVASCULAR ULTRASOUND/IVUS N/A 01/12/2019   Procedure: INTRAVASCULAR ULTRASOUND/IVUS;  Surgeon: Waynetta Sandy, MD;  Location: [place] CV LAB;  Service: Cardiovascular;  Laterality: N/A;   LOWER EXTREMITY VENOGRAPHY Left 01/12/2019   Procedure: LOWER EXTREMITY VENOGRAPHY;  Surgeon: Waynetta Sandy, MD;  Location: [place] CV LAB;  Service: Cardiovascular;  Laterality: Left;    PERIPHERAL VASCULAR INTERVENTION Left 01/12/2019   Procedure: PERIPHERAL VASCULAR INTERVENTION;  Surgeon: Waynetta Sandy, MD;  Location: [place] CV LAB;  Service: Cardiovascular;  Laterality: Left;  common and external iliac venous Ivus and stent   tubes in ears     age 53   WISDOM TOOTH EXTRACTION     Patient Active Problem List   Diagnosis Date Noted   Palpitations 05/25/2022   DVT (deep venous thrombosis) ([place]) 01/12/2019   PE (pulmonary thromboembolism) ([place]) 04/08/2018   Endometriosis 04/07/2018   Chest pain of uncertain etiology [id number]   Asthma, mild intermittent, well-controlled 03/24/2014   Seasonal and perennial allergic rhinitis 03/24/2014    REFERRING DIAG: M25.511 (ICD-10-CM) - Pain in right shoulder   THERAPY DIAG:  Chronic right shoulder pain  Abnormal posture  Muscle weakness (generalized)  Rationale for Evaluation and Treatment Rehabilitation  PERTINENT HISTORY: PMH: Plavix with Hx of DVT/PE, asthma, allergies, LBP   PRECAUTIONS: None   SUBJECTIVE:    I am 50% improved. My neck turns easier, still tight but better. DN was great. I had less pain during 3 church services yesterday.  SUBJECTIVE STATEMENT:  PAIN:  Are you having pain? Not right now.   OBJECTIVE: (objective measures completed at initial evaluation unless otherwise dated)  DIAGNOSTIC FINDINGS:  none   PATIENT SURVEYS:  FOTO 75% goal 76%   COGNITION: Overall cognitive status: Within functional limits for tasks assessed                                  SENSATION: WFL   POSTURE: Rt scapula abducted from spine, slightly rounded   UPPER EXTREMITY ROM:    Pt has full ROM in all planes bil     UPPER EXTREMITY MMT:   MMT Right eval Left eval  Shoulder flexion 4 5  Shoulder extension       Shoulder abduction 4 5  Shoulder adduction      Shoulder internal rotation      Shoulder external rotation      Middle trapezius 4/ 4  Lower trapezius 4- 4  Elbow flexion      Elbow extension      Wrist flexion      Wrist extension      Wrist ulnar deviation      Wrist radial deviation      Wrist pronation      Wrist supination      Grip strength (lbs)      (Blank rows = not tested)   SHOULDER SPECIAL TESTS: Impingement tests: Hawkins/Kennedy impingement test: positive  and Painful arc test: negative SLAP lesions: Clunk test: negative Instability tests: Apprehension test: negative Rotator cuff assessment: Empty can test: negative Biceps assessment: Speed's test: negative   JOINT MOBILITY TESTING:  Slight stiffness present for posterior and inferior glides Rt GH joint Limited ribcage springing on Rt T3-T7   PALPATION:  Signif increase in resting tone with TP present: Rt upper trap, levator infraspinatus, teres minor and major, posterior deltoid, lat, rhomboids             TODAY'S TREATMENT:   12/10/22: Arm bike L1.3 2x2 with PTA present to discuss current status Standing yellow band alternating lat pulls 10x, TC where to contract and where to keep soft. F/B rhomboid stretch 3 breaths Yellow band rows (easy) cuing gentle shoulder blade squeeze 10x f/b levator stretch 1x Bil Bank robbers 10x off wall Thread the needle stretch 3 breaths 1x Bil  Sidelying open the book stretch 10x Bil Serratus Punch:  10x2  Bil  Repeat rhomboid stretch into thoracic extension: holding stretch 2 breaths/ 3 series total   12/06/2022 Arm bike L1 2x2 with PTA present to discuss current status Trigger Point Dry-Needling  Treatment instructions: Expect mild to moderate muscle soreness. S/S of pneumothorax if dry needled over a lung field, and to seek immediate medical attention should they occur. Patient verbalized understanding of these instructions and education.  Patient Consent Given:  Yes Education handout provided: Yes Muscles treated: R thoracic paraspinals  Electrical stimulation performed: No Parameters: N/A Treatment response/outcome: Pt reports twitches with muscles twitches.  Prone scap retraction x10 with 5 sec hold.  Seated RT Upper trap stretch 3x 3 slow breaths, VC to pay attention to her posture and stay soft in her neck. Seated levator stretch as above Seated rhomboid stretch/ doorway stretch Standing open book: 10x Bil,  Standing yellow tband lat pull/ rows   12/03/21: Arm bike L1 2x2 with PTA present to discuss current status Seated RT Upper trap stretch 3x 3 slow breaths,  VC to pay attention to her posture and stay soft in her neck. Seated levator stretch as above Seated rhomboid stretch as above Sidelying open book: 10x Bil, some pain stretching with the Rt arm in long lever position Quadruped Yoga thread the needle stretch RTUE Standing yellow tband lat pull with 3 sec hold to increase "feel" of her mid back muscles Bank robbers facing wall 5x2, VC/TC to feel mid back muscles awakening and keeping upper traps relaxed                                                                                                                                            PATIENT EDUCATION: Education details: HEP and DN handout Person educated: Patient Education method: Explanation, Demonstration, Verbal cues, and Handouts Education comprehension: verbalized understanding and returned demonstration   HOME EXERCISE PROGRAM: Access Code: [email or web address] URL: [email or web address] Date: 11/27/2022 Prepared by: Loistine Simas Beuhring   Exercises - Seated Upper Trapezius Stretch  - 1 x daily - 7 x weekly - 1 sets - 2 reps - 20 hold - Seated Levator Scapulae Stretch  - 1 x daily - 7 x weekly - 1 sets - 2 reps - 20 hold - Seated Rhomboid Stretch  - 1 x daily - 7 x weekly - 1 sets - 2 reps - 20 hold   ASSESSMENT:   CLINICAL IMPRESSION: Pt reports 50% improvement  since start of care. No pain today but definitely muscle fatigue with exercises. Pt felt positive about dry needling effects.    OBJECTIVE IMPAIRMENTS: decreased coordination, decreased strength, increased muscle spasms, impaired flexibility, postural dysfunction, and pain.    ACTIVITY LIMITATIONS: lifting, sleeping, reach over head, and exercise   PARTICIPATION LIMITATIONS: cleaning, laundry, community activity, and exercise   PERSONAL FACTORS: Time since onset of injury/illness/exacerbation are also affecting patient's functional outcome.    REHAB POTENTIAL: Excellent   CLINICAL DECISION MAKING: Stable/uncomplicated   EVALUATION COMPLEXITY: Low     GOALS: Goals reviewed with patient? Yes   SHORT TERM GOALS: Target date: 12/25/22   Pt will be ind with initial HEP Baseline: Goal status: Goal met 12/10/22   2.  Pt will report at least 20% less pain with functional use of Rt UE. Baseline:  Goal status: Goal met 12/10/22   3.  Pt will achieve reduced tightness and trigger point presence by at least 50% along posterior shoulder. Baseline:  Goal status: Goal met 12/10/22   4.  Pt will report improved sleep by at least 20% Baseline:  Goal status: No pain in shoulder but in upper back can still be painful and fingers can get tingly. Partially met      LONG TERM GOALS: Target date: 01/22/23   Pt will be ind with advanced HEP and understand how to avoid exacerbation of symptoms. Baseline:  Goal status: INITIAL   2.  Pt will improve FOTO score to  at least 76% to demo improved function. Baseline: 75% Goal status: INITIAL   3.  Pt will be able to sleep on Rt side and back without pain Baseline:  Goal status: INITIAL   4.  Pt will report improved Rt shoulder pain by at least 70% with daily activities Baseline:  Goal status: INITIAL   5.  Pt will be able to perform functional reaching and lifting tasks with Rt UE without pain Baseline:  Goal status: INITIAL       PLAN:    PT FREQUENCY: 1-2x/week   PT DURATION: 8 weeks   PLANNED INTERVENTIONS: Therapeutic exercises, Therapeutic activity, Neuromuscular re-education, Patient/Family education, Self Care, Joint mobilization, Dry Needling, Electrical stimulation, Spinal mobilization, Cryotherapy, Moist heat, Taping, Vasopneumatic device, Ionotophoresis 4mg /ml Dexamethasone, and Manual therapy   PLAN FOR NEXT SESSION: Continue with TPDN and strengthening.    Othello Sgroi, PTA 12/10/2022, 11:44 AM

## 2022-12-12 ENCOUNTER — Encounter: Payer: Self-pay | Admitting: Physical Therapy

## 2022-12-12 ENCOUNTER — Ambulatory Visit: Payer: BC Managed Care – PPO | Admitting: Physical Therapy

## 2022-12-12 DIAGNOSIS — G8929 Other chronic pain: Secondary | ICD-10-CM | POA: Diagnosis not present

## 2022-12-12 DIAGNOSIS — M6281 Muscle weakness (generalized): Secondary | ICD-10-CM

## 2022-12-12 DIAGNOSIS — R293 Abnormal posture: Secondary | ICD-10-CM

## 2022-12-12 DIAGNOSIS — M25511 Pain in right shoulder: Secondary | ICD-10-CM | POA: Diagnosis not present

## 2022-12-12 NOTE — Therapy (Signed)
OUTPATIENT PHYSICAL THERAPY TREATMENT NOTE   Patient Name: Sierra Olsen MRN: [id number] DOB:May 06, 1976, 47 y.o., female Today's Date: 12/12/2022  PCP: Marda Stalker, PA-C  REFERRING PROVIDER: Marda Stalker, PA-C   END Of SESSION:   PT End of Session - 12/12/22 0937     Visit Number 5    Date for PT Re-Evaluation 01/22/23    Authorization Type BCBS    PT Start Time [phone]    PT Stop Time [id number]    PT Time Calculation (min) 49 min    Activity Tolerance Patient tolerated treatment well    Behavior During Therapy [facility] for tasks assessed/performed                Past Medical History:  Diagnosis Date   Allergy    Anxiety    Anxiety    Asthma    Celiac disease    Celiac disease    DVT (deep venous thrombosis) (HCC)    Of iliac vein of LLE   DVT (deep venous thrombosis) ([place]) 01/12/2019   Endometriosis    Endometriosis    Environmental allergies    Epigastric pain    Family history of adverse reaction to anesthesia    SISTER HAD TROUBLE BREATHING   GERD (gastroesophageal reflux disease)    Headache    Hx of migraine headaches    Hx of migraine headaches    Hypoglycemia    Hypoglycemia    Leukocytosis    Low back pain    Nausea    PE (pulmonary thromboembolism) ([place]) 03/2018   PE (pulmonary thromboembolism) (HCC)    PVC (premature ventricular contraction)    Retinal edema    Retinal edema    RUQ abdominal pain    Tinnitus    Tinnitus    Past Surgical History:  Procedure Laterality Date   ABDOMINAL HYSTERECTOMY     DILATION AND CURETTAGE OF UTERUS     INTRAVASCULAR ULTRASOUND/IVUS N/A 01/12/2019   Procedure: INTRAVASCULAR ULTRASOUND/IVUS;  Surgeon: Waynetta Sandy, MD;  Location: [place] [facility];  Service: Cardiovascular;  Laterality: N/A;   LOWER EXTREMITY VENOGRAPHY Left 01/12/2019   Procedure: LOWER EXTREMITY VENOGRAPHY;  Surgeon: Waynetta Sandy, MD;  Location: [place] [facility];  Service: Cardiovascular;  Laterality:  Left;   PERIPHERAL VASCULAR INTERVENTION Left 01/12/2019   Procedure: PERIPHERAL VASCULAR INTERVENTION;  Surgeon: Waynetta Sandy, MD;  Location: [place] [facility];  Service: Cardiovascular;  Laterality: Left;  common and external iliac venous Ivus and stent   tubes in ears     age 68   WISDOM TOOTH EXTRACTION     Patient Active Problem List   Diagnosis Date Noted   Palpitations 05/25/2022   DVT (deep venous thrombosis) ([place]) 01/12/2019   PE (pulmonary thromboembolism) ([place]) 04/08/2018   Endometriosis 04/07/2018   Chest pain of uncertain etiology [id number]   Asthma, mild intermittent, well-controlled 03/24/2014   Seasonal and perennial allergic rhinitis 03/24/2014    REFERRING DIAG: M25.511 (ICD-10-CM) - Pain in right shoulder   THERAPY DIAG:  Chronic right shoulder pain  Abnormal posture  Muscle weakness (generalized)  Rationale for Evaluation and Treatment Rehabilitation  PERTINENT HISTORY: PMH: Plavix with Hx of DVT/PE, asthma, allergies, LBP   PRECAUTIONS: None   SUBJECTIVE:   I am having less shoulder pain at night.  I got very sore after doing the serratus punches.  I get very tired and sore after DN.  SUBJECTIVE STATEMENT:  PAIN:  Are you having pain? Not right  now.   OBJECTIVE: (objective measures completed at initial evaluation unless otherwise dated)  DIAGNOSTIC FINDINGS:  none   PATIENT SURVEYS:  FOTO 75% goal 76%   COGNITION: Overall cognitive status: Within functional limits for tasks assessed                                  SENSATION: WFL   POSTURE: Rt scapula abducted from spine, slightly rounded   UPPER EXTREMITY ROM:    Pt has full ROM in all planes bil     UPPER EXTREMITY MMT:   MMT Right eval Left eval  Shoulder flexion 4 5  Shoulder extension      Shoulder abduction 4 5  Shoulder adduction      Shoulder internal rotation      Shoulder external rotation      Middle trapezius 4/ 4  Lower trapezius 4- 4  Elbow  flexion      Elbow extension      Wrist flexion      Wrist extension      Wrist ulnar deviation      Wrist radial deviation      Wrist pronation      Wrist supination      Grip strength (lbs)      (Blank rows = not tested)   SHOULDER SPECIAL TESTS: Impingement tests: Hawkins/Kennedy impingement test: positive  and Painful arc test: negative SLAP lesions: Clunk test: negative Instability tests: Apprehension test: negative Rotator cuff assessment: Empty can test: negative Biceps assessment: Speed's test: negative   JOINT MOBILITY TESTING:  Slight stiffness present for posterior and inferior glides Rt GH joint Limited ribcage springing on Rt T3-T7   PALPATION:  Signif increase in resting tone with TP present: Rt upper trap, levator infraspinatus, teres minor and major, posterior deltoid, lat, rhomboids             TODAY'S TREATMENT:  12/12/22: Arm bike L2 2x2 PT present to discuss status Trigger Point Dry-Needling  Treatment instructions: Expect mild to moderate muscle soreness. S/S of pneumothorax if dry needled over a lung field, and to seek immediate medical attention should they occur. Patient verbalized understanding of these instructions and education.  Patient Consent Given: Yes Education handout provided: Previously provided Muscles treated: Rt upper trap Electrical stimulation performed: No Parameters: N/A Treatment response/outcome: twitch and elongation of muscle STM Rt upper trap after DN Prone focused shoulder blade retraction with depression 3x10" holds Seated review of Rt upper trap stretch Moist heat to Rt upper trap seated x 10' end of session  12/10/22: Arm bike L1.3 2x2 with PTA present to discuss current status Standing yellow band alternating lat pulls 10x, TC where to contract and where to keep soft. F/B rhomboid stretch 3 breaths Yellow band rows (easy) cuing gentle shoulder blade squeeze 10x f/b levator stretch 1x Bil Bank robbers 10x off wall Thread  the needle stretch 3 breaths 1x Bil  Sidelying open the book stretch 10x Bil Serratus Punch:  10x2  Bil  Repeat rhomboid stretch into thoracic extension: holding stretch 2 breaths/ 3 series total   12/06/2022 Arm bike L1 2x2 with PTA present to discuss current status Trigger Point Dry-Needling  Treatment instructions: Expect mild to moderate muscle soreness. S/S of pneumothorax if dry needled over a lung field, and to seek immediate medical attention should they occur. Patient verbalized understanding of these instructions and education.  Patient Consent Given: Yes Education  handout provided: Yes Muscles treated: R thoracic paraspinals  Electrical stimulation performed: No Parameters: N/A Treatment response/outcome: Pt reports twitches with muscles twitches.  Prone scap retraction x10 with 5 sec hold.  Seated RT Upper trap stretch 3x 3 slow breaths, VC to pay attention to her posture and stay soft in her neck. Seated levator stretch as above Seated rhomboid stretch/ doorway stretch Standing open book: 10x Bil,  Standing yellow tband lat pull/ rows                                                                                                                         PATIENT EDUCATION: Education details: HEP and DN handout Person educated: Patient Education method: Explanation, Demonstration, Verbal cues, and Handouts Education comprehension: verbalized understanding and returned demonstration   HOME EXERCISE PROGRAM: Access Code: [email or web address] URL: [email or web address] Date: 11/27/2022 Prepared by: Venetia Night Evalyn Shultis   Exercises - Seated Upper Trapezius Stretch  - 1 x daily - 7 x weekly - 1 sets - 2 reps - 20 hold - Seated Levator Scapulae Stretch  - 1 x daily - 7 x weekly - 1 sets - 2 reps - 20 hold - Seated Rhomboid Stretch  - 1 x daily - 7 x weekly - 1 sets - 2 reps - 20 hold   ASSESSMENT:   CLINICAL IMPRESSION: Pt reports she gets very tired and sore following  DN for 1-2 days, but does report some relief to date noted especially at night with improved sleep and during 3 church services last weekend.  TPDN targeting Rt upper trap today with significant twitch and release today through anterior band which left Pt very sore.  PT and Pt agreed to only focus on one muscle group at a time for now given Pt's sensitivity to the after affects.  She demos improving recruitment of scapular retraction and depression today in prone.  She will benefit from posterior shoulder DN if she agrees next time.   OBJECTIVE IMPAIRMENTS: decreased coordination, decreased strength, increased muscle spasms, impaired flexibility, postural dysfunction, and pain.    ACTIVITY LIMITATIONS: lifting, sleeping, reach over head, and exercise   PARTICIPATION LIMITATIONS: cleaning, laundry, community activity, and exercise   PERSONAL FACTORS: Time since onset of injury/illness/exacerbation are also affecting patient's functional outcome.    REHAB POTENTIAL: Excellent   CLINICAL DECISION MAKING: Stable/uncomplicated   EVALUATION COMPLEXITY: Low     GOALS: Goals reviewed with patient? Yes   SHORT TERM GOALS: Target date: 12/25/22   Pt will be ind with initial HEP Baseline: Goal status: Goal met 12/10/22   2.  Pt will report at least 20% less pain with functional use of Rt UE. Baseline:  Goal status: Goal met 12/10/22   3.  Pt will achieve reduced tightness and trigger point presence by at least 50% along posterior shoulder. Baseline:  Goal status: Goal met 12/10/22   4.  Pt will report improved sleep by at least 20% Baseline:  Goal status: No pain in shoulder  but in upper back can still be painful and fingers can get tingly. Partially met      LONG TERM GOALS: Target date: 01/22/23   Pt will be ind with advanced HEP and understand how to avoid exacerbation of symptoms. Baseline:  Goal status: INITIAL   2.  Pt will improve FOTO score to at least 76% to demo improved  function. Baseline: 75% Goal status: INITIAL   3.  Pt will be able to sleep on Rt side and back without pain Baseline:  Goal status: INITIAL   4.  Pt will report improved Rt shoulder pain by at least 70% with daily activities Baseline:  Goal status: INITIAL   5.  Pt will be able to perform functional reaching and lifting tasks with Rt UE without pain Baseline:  Goal status: INITIAL       PLAN:   PT FREQUENCY: 1-2x/week   PT DURATION: 8 weeks   PLANNED INTERVENTIONS: Therapeutic exercises, Therapeutic activity, Neuromuscular re-education, Patient/Family education, Self Care, Joint mobilization, Dry Needling, Electrical stimulation, Spinal mobilization, Cryotherapy, Moist heat, Taping, Vasopneumatic device, Ionotophoresis 4mg /ml Dexamethasone, and Manual therapy   PLAN FOR NEXT SESSION: Continue with TPDN and strengthening.    Ziyan Hillmer, PT 12/12/22 11:56 AM

## 2022-12-14 ENCOUNTER — Ambulatory Visit
Admission: RE | Admit: 2022-12-14 | Discharge: 2022-12-14 | Disposition: A | Discharge status: Home / Self Care | Payer: BC Managed Care – PPO | Source: Ambulatory Visit | Attending: Family Medicine | Admitting: Family Medicine

## 2022-12-14 DIAGNOSIS — Z1231 Encounter for screening mammogram for malignant neoplasm of breast: Secondary | ICD-10-CM

## 2022-12-17 ENCOUNTER — Encounter: Payer: Self-pay | Admitting: Physical Therapy

## 2022-12-17 ENCOUNTER — Ambulatory Visit: Payer: BC Managed Care – PPO | Admitting: Physical Therapy

## 2022-12-17 DIAGNOSIS — G8929 Other chronic pain: Secondary | ICD-10-CM | POA: Diagnosis not present

## 2022-12-17 DIAGNOSIS — R293 Abnormal posture: Secondary | ICD-10-CM | POA: Diagnosis not present

## 2022-12-17 DIAGNOSIS — M6281 Muscle weakness (generalized): Secondary | ICD-10-CM | POA: Diagnosis not present

## 2022-12-17 DIAGNOSIS — M25511 Pain in right shoulder: Secondary | ICD-10-CM | POA: Diagnosis not present

## 2022-12-17 NOTE — Therapy (Signed)
OUTPATIENT PHYSICAL THERAPY TREATMENT NOTE   Patient Name: Sierra Olsen MRN: [id number] DOB:1976-08-17, 47 y.o., female Today's Date: 12/17/2022  PCP: Marda Stalker, PA-C  REFERRING PROVIDER: Marda Stalker, PA-C   END Of SESSION:   PT End of Session - 12/17/22 1020     Visit Number 6    Date for PT Re-Evaluation 01/22/23    Authorization Type BCBS    PT Start Time 1016    PT Stop Time 1056    PT Time Calculation (min) 40 min    Activity Tolerance Patient tolerated treatment well    Behavior During Therapy WFL for tasks assessed/performed                 Past Medical History:  Diagnosis Date   Allergy    Anxiety    Anxiety    Asthma    Celiac disease    Celiac disease    DVT (deep venous thrombosis) (HCC)    Of iliac vein of LLE   DVT (deep venous thrombosis) ([place]) 01/12/2019   Endometriosis    Endometriosis    Environmental allergies    Epigastric pain    Family history of adverse reaction to anesthesia    SISTER HAD TROUBLE BREATHING   GERD (gastroesophageal reflux disease)    Headache    Hx of migraine headaches    Hx of migraine headaches    Hypoglycemia    Hypoglycemia    Leukocytosis    Low back pain    Nausea    PE (pulmonary thromboembolism) ([place]) 03/2018   PE (pulmonary thromboembolism) (HCC)    PVC (premature ventricular contraction)    Retinal edema    Retinal edema    RUQ abdominal pain    Tinnitus    Tinnitus    Past Surgical History:  Procedure Laterality Date   ABDOMINAL HYSTERECTOMY     DILATION AND CURETTAGE OF UTERUS     INTRAVASCULAR ULTRASOUND/IVUS N/A 01/12/2019   Procedure: INTRAVASCULAR ULTRASOUND/IVUS;  Surgeon: Waynetta Sandy, MD;  Location: [place] [facility];  Service: Cardiovascular;  Laterality: N/A;   LOWER EXTREMITY VENOGRAPHY Left 01/12/2019   Procedure: LOWER EXTREMITY VENOGRAPHY;  Surgeon: Waynetta Sandy, MD;  Location: [place] [facility];  Service: Cardiovascular;  Laterality:  Left;   PERIPHERAL VASCULAR INTERVENTION Left 01/12/2019   Procedure: PERIPHERAL VASCULAR INTERVENTION;  Surgeon: Waynetta Sandy, MD;  Location: [place] [facility];  Service: Cardiovascular;  Laterality: Left;  common and external iliac venous Ivus and stent   tubes in ears     age 47   WISDOM TOOTH EXTRACTION     Patient Active Problem List   Diagnosis Date Noted   Palpitations 05/25/2022   DVT (deep venous thrombosis) ([place]) 01/12/2019   PE (pulmonary thromboembolism) ([place]) 04/08/2018   Endometriosis 04/07/2018   Chest pain of uncertain etiology [id number]/95/6387   Asthma, mild intermittent, well-controlled 03/24/2014   Seasonal and perennial allergic rhinitis 03/24/2014    REFERRING DIAG: M25.511 (ICD-10-CM) - Pain in right shoulder   THERAPY DIAG:  Chronic right shoulder pain  Abnormal posture  Muscle weakness (generalized)  Rationale for Evaluation and Treatment Rehabilitation  PERTINENT HISTORY: PMH: Plavix with Hx of DVT/PE, asthma, allergies, LBP   PRECAUTIONS: None   SUBJECTIVE:   SUBJECTIVE STATEMENT: I did very well with the dry needling, not as sore but I fell off the wagon for 2 days with my exercises. I can tell how weak I am.  PAIN:  Are you having  pain? Not right now.   OBJECTIVE: (objective measures completed at initial evaluation unless otherwise dated)  DIAGNOSTIC FINDINGS:  none   PATIENT SURVEYS:  FOTO 75% goal 76%   COGNITION: Overall cognitive status: Within functional limits for tasks assessed                                  SENSATION: WFL   POSTURE: Rt scapula abducted from spine, slightly rounded   UPPER EXTREMITY ROM:    Pt has full ROM in all planes bil     UPPER EXTREMITY MMT:   MMT Right eval Left eval  Shoulder flexion 4 5  Shoulder extension      Shoulder abduction 4 5  Shoulder adduction      Shoulder internal rotation      Shoulder external rotation      Middle trapezius 4/ 4  Lower trapezius 4- 4  Elbow  flexion      Elbow extension      Wrist flexion      Wrist extension      Wrist ulnar deviation      Wrist radial deviation      Wrist pronation      Wrist supination      Grip strength (lbs)      (Blank rows = not tested)   SHOULDER SPECIAL TESTS: Impingement tests: Hawkins/Kennedy impingement test: positive  and Painful arc test: negative SLAP lesions: Clunk test: negative Instability tests: Apprehension test: negative Rotator cuff assessment: Empty can test: negative Biceps assessment: Speed's test: negative   JOINT MOBILITY TESTING:  Slight stiffness present for posterior and inferior glides Rt GH joint Limited ribcage springing on Rt T3-T7   PALPATION:  Signif increase in resting tone with TP present: Rt upper trap, levator infraspinatus, teres minor and major, posterior deltoid, lat, rhomboids             TODAY'S TREATMENT:   12/17/22: Arm bike: 1.3 2x2 with PTA present to discuss current status.  Cat/cow 10x, VC for segmental movement Yoga thread the Needle pose/stretch 3x 3 breaths Bil Supine serratus punches 10x Bil Supine horizontal abd with yellow TB: 2x5 focusing on rhomboid contractions without tensing her neck.  Supine Pilates breath with TA contraction and focusing on not tensing her upper traps. Standing yellow band rows 2x10, extensions alternating arms 10x  12/12/22: Arm bike L2 2x2 PT present to discuss status Trigger Point Dry-Needling  Treatment instructions: Expect mild to moderate muscle soreness. S/S of pneumothorax if dry needled over a lung field, and to seek immediate medical attention should they occur. Patient verbalized understanding of these instructions and education.  Patient Consent Given: Yes Education handout provided: Previously provided Muscles treated: Rt upper trap Electrical stimulation performed: No Parameters: N/A Treatment response/outcome: twitch and elongation of muscle STM Rt upper trap after DN Prone focused shoulder blade  retraction with depression 3x10" holds Seated review of Rt upper trap stretch Moist heat to Rt upper trap seated x 10' end of session  12/10/22: Arm bike L1.3 2x2 with PTA present to discuss current status Standing yellow band alternating lat pulls 10x, TC where to contract and where to keep soft. F/B rhomboid stretch 3 breaths Yellow band rows (easy) cuing gentle shoulder blade squeeze 10x f/b levator stretch 1x Bil Bank robbers 10x off wall Thread the needle stretch 3 breaths 1x Bil  Sidelying open the book stretch 10x Bil Serratus Punch:  10x2  Bil  Repeat rhomboid stretch into thoracic extension: holding stretch 2 breaths/ 3 series total                                                                                                                          PATIENT EDUCATION: Education details: HEP and DN handout Person educated: Patient Education method: Explanation, Demonstration, Verbal cues, and Handouts Education comprehension: verbalized understanding and returned demonstration   HOME EXERCISE PROGRAM: Access Code: [email or web address] URL: [email or web address] Date: 11/27/2022 Prepared by: Loistine Simas Beuhring   Exercises - Seated Upper Trapezius Stretch  - 1 x daily - 7 x weekly - 1 sets - 2 reps - 20 hold - Seated Levator Scapulae Stretch  - 1 x daily - 7 x weekly - 1 sets - 2 reps - 20 hold - Seated Rhomboid Stretch  - 1 x daily - 7 x weekly - 1 sets - 2 reps - 20 hold   ASSESSMENT:   CLINICAL IMPRESSION: Pt had no fatigue after dry needling. Pt had a few days with increased pain in her back but thinks it has to do a lot with "falling off the wagon" for 2 days and not doing any HEP. Pt had less soreness with serratus actions/movements than previously. Pt reports feeling more "movement" and now bc of that realizes how weak she is in those movements.      OBJECTIVE IMPAIRMENTS: decreased coordination, decreased strength, increased muscle spasms, impaired flexibility,  postural dysfunction, and pain.    ACTIVITY LIMITATIONS: lifting, sleeping, reach over head, and exercise   PARTICIPATION LIMITATIONS: cleaning, laundry, community activity, and exercise   PERSONAL FACTORS: Time since onset of injury/illness/exacerbation are also affecting patient's functional outcome.    REHAB POTENTIAL: Excellent   CLINICAL DECISION MAKING: Stable/uncomplicated   EVALUATION COMPLEXITY: Low     GOALS: Goals reviewed with patient? Yes   SHORT TERM GOALS: Target date: 12/25/22   Pt will be ind with initial HEP Baseline: Goal status: Goal met 12/10/22   2.  Pt will report at least 20% less pain with functional use of Rt UE. Baseline:  Goal status: Goal met 12/10/22   3.  Pt will achieve reduced tightness and trigger point presence by at least 50% along posterior shoulder. Baseline:  Goal status: Goal met 12/10/22   4.  Pt will report improved sleep by at least 20% Baseline:  Goal status: No pain in shoulder but in upper back can still be painful and fingers can get tingly. Partially met      LONG TERM GOALS: Target date: 01/22/23   Pt will be ind with advanced HEP and understand how to avoid exacerbation of symptoms. Baseline:  Goal status: INITIAL   2.  Pt will improve FOTO score to at least 76% to demo improved function. Baseline: 75% Goal status: INITIAL   3.  Pt will be able to sleep on Rt side and back without pain Baseline:  Goal status: INITIAL   4.  Pt will report improved Rt shoulder pain by at least 70% with daily activities Baseline:  Goal status: INITIAL   5.  Pt will be able to perform functional reaching and lifting tasks with Rt UE without pain Baseline:  Goal status: INITIAL       PLAN:   PT FREQUENCY: 1-2x/week   PT DURATION: 8 weeks   PLANNED INTERVENTIONS: Therapeutic exercises, Therapeutic activity, Neuromuscular re-education, Patient/Family education, Self Care, Joint mobilization, Dry Needling, Electrical stimulation,  Spinal mobilization, Cryotherapy, Moist heat, Taping, Vasopneumatic device, Ionotophoresis 4mg /ml Dexamethasone, and Manual therapy   PLAN FOR NEXT SESSION: Continue with TPDN and strengthening. Pt may be ready for band work to be included in her HEP?  Myrene Galas, PTA 12/17/22 10:57 AM

## 2022-12-19 ENCOUNTER — Encounter: Payer: Self-pay | Admitting: Physical Therapy

## 2022-12-19 ENCOUNTER — Ambulatory Visit: Payer: BC Managed Care – PPO | Admitting: Physical Therapy

## 2022-12-19 DIAGNOSIS — G8929 Other chronic pain: Secondary | ICD-10-CM

## 2022-12-19 DIAGNOSIS — M6281 Muscle weakness (generalized): Secondary | ICD-10-CM | POA: Diagnosis not present

## 2022-12-19 DIAGNOSIS — R293 Abnormal posture: Secondary | ICD-10-CM

## 2022-12-19 DIAGNOSIS — M25511 Pain in right shoulder: Secondary | ICD-10-CM | POA: Diagnosis not present

## 2022-12-19 NOTE — Therapy (Signed)
OUTPATIENT PHYSICAL THERAPY TREATMENT NOTE   Patient Name: Sierra Olsen MRN: [id number] DOB:[birth year], 47 y.o., female Today's Date: 12/19/2022  PCP: Marda Stalker, PA-C  REFERRING PROVIDER: Marda Stalker, PA-C   END Of SESSION:   PT End of Session - 12/19/22 0935     Visit Number 7    Date for PT Re-Evaluation 01/22/23    Authorization Type BCBS    PT Start Time 0930    PT Stop Time 1030    PT Time Calculation (min) 60 min    Activity Tolerance Patient tolerated treatment well    Behavior During Therapy WFL for tasks assessed/performed                  Past Medical History:  Diagnosis Date   Allergy    Anxiety    Anxiety    Asthma    Celiac disease    Celiac disease    DVT (deep venous thrombosis) (HCC)    Of iliac vein of LLE   DVT (deep venous thrombosis) ([place]) 01/12/2019   Endometriosis    Endometriosis    Environmental allergies    Epigastric pain    Family history of adverse reaction to anesthesia    SISTER HAD TROUBLE BREATHING   GERD (gastroesophageal reflux disease)    Headache    Hx of migraine headaches    Hx of migraine headaches    Hypoglycemia    Hypoglycemia    Leukocytosis    Low back pain    Nausea    PE (pulmonary thromboembolism) ([place]) 03/2018   PE (pulmonary thromboembolism) (HCC)    PVC (premature ventricular contraction)    Retinal edema    Retinal edema    RUQ abdominal pain    Tinnitus    Tinnitus    Past Surgical History:  Procedure Laterality Date   ABDOMINAL HYSTERECTOMY     DILATION AND CURETTAGE OF UTERUS     INTRAVASCULAR ULTRASOUND/IVUS N/A 01/12/2019   Procedure: INTRAVASCULAR ULTRASOUND/IVUS;  Surgeon: Waynetta Sandy, MD;  Location: [facility];  Service: Cardiovascular;  Laterality: N/A;   LOWER EXTREMITY VENOGRAPHY Left 01/12/2019   Procedure: LOWER EXTREMITY VENOGRAPHY;  Surgeon: Waynetta Sandy, MD;  Location: [facility];  Service: Cardiovascular;  Laterality:  Left;   PERIPHERAL VASCULAR INTERVENTION Left 01/12/2019   Procedure: PERIPHERAL VASCULAR INTERVENTION;  Surgeon: Waynetta Sandy, MD;  Location: [facility];  Service: Cardiovascular;  Laterality: Left;  common and external iliac venous Ivus and stent   tubes in ears     age 56   WISDOM TOOTH EXTRACTION     Patient Active Problem List   Diagnosis Date Noted   Palpitations 05/25/2022   DVT (deep venous thrombosis) ([place]) 01/12/2019   PE (pulmonary thromboembolism) ([place]) 04/08/2018   Endometriosis 04/07/2018   Chest pain of uncertain etiology 29/52/8413   Asthma, mild intermittent, well-controlled 03/24/2014   Seasonal and perennial allergic rhinitis 03/24/2014    REFERRING DIAG: M25.511 (ICD-10-CM) - Pain in right shoulder   THERAPY DIAG:  Chronic right shoulder pain  Abnormal posture  Muscle weakness (generalized)  Rationale for Evaluation and Treatment Rehabilitation  PERTINENT HISTORY: PMH: Plavix with Hx of DVT/PE, asthma, allergies, LBP   PRECAUTIONS: None   SUBJECTIVE:    SUBJECTIVE STATEMENT:  I can tell I am moving more.  I can turn my head when driving further and the open book and hug yourself stretch are going further.  I used the theracane and  felt a pop in the upper back - no pain, felt good.  PAIN:  Are you having pain? 1/10 behind the shoulder and shoulder blade   OBJECTIVE: (objective measures completed at initial evaluation unless otherwise dated)  DIAGNOSTIC FINDINGS:  none   PATIENT SURVEYS:  FOTO 75% goal 76%   COGNITION: Overall cognitive status: Within functional limits for tasks assessed                                  SENSATION: WFL   POSTURE: Rt scapula abducted from spine, slightly rounded   UPPER EXTREMITY ROM:    Pt has full ROM in all planes bil     UPPER EXTREMITY MMT:   MMT Right eval Left eval  Shoulder flexion 4 5  Shoulder extension      Shoulder abduction 4 5  Shoulder adduction      Shoulder  internal rotation      Shoulder external rotation      Middle trapezius 4/ 4  Lower trapezius 4- 4  Elbow flexion      Elbow extension      Wrist flexion      Wrist extension      Wrist ulnar deviation      Wrist radial deviation      Wrist pronation      Wrist supination      Grip strength (lbs)      (Blank rows = not tested)   SHOULDER SPECIAL TESTS: Impingement tests: Hawkins/Kennedy impingement test: positive  and Painful arc test: negative SLAP lesions: Clunk test: negative Instability tests: Apprehension test: negative Rotator cuff assessment: Empty can test: negative Biceps assessment: Speed's test: negative   JOINT MOBILITY TESTING:  Slight stiffness present for posterior and inferior glides Rt GH joint Limited ribcage springing on Rt T3-T7   PALPATION:  Signif increase in resting tone with TP present: Rt upper trap, levator infraspinatus, teres minor and major, posterior deltoid, lat, rhomboids             TODAY'S TREATMENT:  12/19/22: UBE L2 2x2 PT present to discuss status and plan Trigger Point Dry-Needling  Treatment instructions: Expect mild to moderate muscle soreness. S/S of pneumothorax if dry needled over a lung field, and to seek immediate medical attention should they occur. Patient verbalized understanding of these instructions and education.  Patient Consent Given: Yes Education handout provided: Previously provided Muscles treated: Rt lat, infraspinatus, teres minor, teres major, upper trap Electrical stimulation performed: No Parameters: N/A Treatment response/outcome: cramp and elongation STM to posterior shoulder and upper trap after DN, ribcage springing Rt, thoracic broadening and elongation of Rt paraspinal Red tband row and bil shoulder ext 1x10 (progressed from yellow), TC and VC for pelvic alignment under trunk for stacking ribcage and engaging deep core in standing Yellow tband bil shoulder ER x10 (gave single arm for HEP) Standing horiz  shoulder abd yellow band x 10 (HEP) Moist heat Rt shoulder x 10' end of session  12/17/22: Arm bike: 1.3 2x2 with PTA present to discuss current status.  Cat/cow 10x, VC for segmental movement Yoga thread the Needle pose/stretch 3x 3 breaths Bil Supine serratus punches 10x Bil Supine horizontal abd with yellow TB: 2x5 focusing on rhomboid contractions without tensing her neck.  Supine Pilates breath with TA contraction and focusing on not tensing her upper traps. Standing yellow band rows 2x10, extensions alternating arms 10x  12/12/22: Arm bike  L2 2x2 PT present to discuss status Trigger Point Dry-Needling  Treatment instructions: Expect mild to moderate muscle soreness. S/S of pneumothorax if dry needled over a lung field, and to seek immediate medical attention should they occur. Patient verbalized understanding of these instructions and education.  Patient Consent Given: Yes Education handout provided: Previously provided Muscles treated: Rt upper trap Electrical stimulation performed: No Parameters: N/A Treatment response/outcome: twitch and elongation of muscle STM Rt upper trap after DN Prone focused shoulder blade retraction with depression 3x10" holds Seated review of Rt upper trap stretch Moist heat to Rt upper trap seated x 10' end of session                                                                                                       PATIENT EDUCATION: Education details: HEP and DN handout Person educated: Patient Education method: Explanation, Demonstration, Verbal cues, and Handouts Education comprehension: verbalized understanding and returned demonstration   HOME EXERCISE PROGRAM: Access Code: [email or web address] URL: [email or web address] Date: 12/19/2022 Prepared by: Venetia Night Nickolus Wadding  Exercises - Seated Upper Trapezius Stretch  - 1 x daily - 7 x weekly - 1 sets - 2 reps - 20 hold - Seated Levator Scapulae Stretch  - 1 x daily - 7 x weekly - 1 sets  - 2 reps - 20 hold - Seated Rhomboid Stretch  - 1 x daily - 7 x weekly - 1 sets - 2 reps - 20 hold - Standing Shoulder Row with Anchored Resistance  - 1 x daily - 7 x weekly - 2 sets - 10 reps - Shoulder extension with resistance - Neutral  - 1 x daily - 7 x weekly - 2 sets - 10 reps - Standing Thoracic Open Book at [place]  - 1 x daily - 7 x weekly - 3 sets - 10 reps - Shoulder External Rotation and Scapular Retraction with Resistance  - 1 x daily - 7 x weekly - 2 sets - 10 reps - Shoulder External Rotation with Anchored Resistance  - 1 x daily - 7 x weekly - 2 sets - 10 reps - Standing Shoulder Horizontal Abduction with Resistance  - 1 x daily - 7 x weekly - 2 sets - 10 reps   ASSESSMENT:   CLINICAL IMPRESSION: Pt reports increased movement for head turns when driving and trunk rotation within stretches and A/ROM therex.  She has less pain during church services.  She was agreeable to new muscle groups for DN targets today so PT focused on posterior shoulder and revisited Rt upper trap TP with good tolerance by Pt.  She reported greater ease and less painful ROM following manual techniques.  Progressed row and ext to red tband with cueing for proper trunk alignment of ribcage and pelvis in standing within therex.  Added yellow band shoulder ER and horiz abd with cueing for technique and muscle recruitment targets.      OBJECTIVE IMPAIRMENTS: decreased coordination, decreased strength, increased muscle spasms, impaired flexibility, postural dysfunction, and pain.    ACTIVITY LIMITATIONS: lifting, sleeping, reach over head, and  exercise   PARTICIPATION LIMITATIONS: cleaning, laundry, community activity, and exercise   PERSONAL FACTORS: Time since onset of injury/illness/exacerbation are also affecting patient's functional outcome.    REHAB POTENTIAL: Excellent   CLINICAL DECISION MAKING: Stable/uncomplicated   EVALUATION COMPLEXITY: Low     GOALS: Goals reviewed with patient? Yes   SHORT  TERM GOALS: Target date: 12/25/22   Pt will be ind with initial HEP Baseline: Goal status: Goal met 12/10/22   2.  Pt will report at least 20% less pain with functional use of Rt UE. Baseline:  Goal status: Goal met 12/10/22   3.  Pt will achieve reduced tightness and trigger point presence by at least 50% along posterior shoulder. Baseline:  Goal status: Goal met 12/10/22   4.  Pt will report improved sleep by at least 20% Baseline:  Goal status: No pain in shoulder but in upper back can still be painful and fingers can get tingly. Partially met      LONG TERM GOALS: Target date: 01/22/23   Pt will be ind with advanced HEP and understand how to avoid exacerbation of symptoms. Baseline:  Goal status: INITIAL   2.  Pt will improve FOTO score to at least 76% to demo improved function. Baseline: 75% Goal status: INITIAL   3.  Pt will be able to sleep on Rt side and back without pain Baseline:  Goal status: INITIAL   4.  Pt will report improved Rt shoulder pain by at least 70% with daily activities Baseline:  Goal status: INITIAL   5.  Pt will be able to perform functional reaching and lifting tasks with Rt UE without pain Baseline:  Goal status: INITIAL       PLAN:   PT FREQUENCY: 1-2x/week   PT DURATION: 8 weeks   PLANNED INTERVENTIONS: Therapeutic exercises, Therapeutic activity, Neuromuscular re-education, Patient/Family education, Self Care, Joint mobilization, Dry Needling, Electrical stimulation, Spinal mobilization, Cryotherapy, Moist heat, Taping, Vasopneumatic device, Ionotophoresis 4mg /ml Dexamethasone, and Manual therapy   PLAN FOR NEXT SESSION: Continue with TPDN and strengthening. F/U on tband progression and watch for pelvic alignment and ribcage position in standing therex, progress middle and lower trap strength as tol, Rt shoulder A/ROM or with light resistance dumbbell on mat for flexion, T  Sante Biedermann, PT 12/19/22 10:32 AM

## 2022-12-21 DIAGNOSIS — F4010 Social phobia, unspecified: Secondary | ICD-10-CM | POA: Diagnosis not present

## 2022-12-23 NOTE — Therapy (Unsigned)
OUTPATIENT PHYSICAL THERAPY TREATMENT NOTE   Patient Name: Sierra Olsen MRN: [id number] DOB:July 23, 1976, 47 y.o., female Today's Date: 12/24/2022  PCP: Marda Stalker, PA-C  REFERRING PROVIDER: Marda Stalker, PA-C   END Of SESSION:   PT End of Session - 12/24/22 1049     Visit Number 8    Date for PT Re-Evaluation 01/22/23    Authorization Type BCBS    PT Start Time 1018    PT Stop Time 1056    PT Time Calculation (min) 38 min    Activity Tolerance Patient tolerated treatment well    Behavior During Therapy WFL for tasks assessed/performed                   Past Medical History:  Diagnosis Date   Allergy    Anxiety    Anxiety    Asthma    Celiac disease    Celiac disease    DVT (deep venous thrombosis) (HCC)    Of iliac vein of LLE   DVT (deep venous thrombosis) ([place]) 01/12/2019   Endometriosis    Endometriosis    Environmental allergies    Epigastric pain    Family history of adverse reaction to anesthesia    SISTER HAD TROUBLE BREATHING   GERD (gastroesophageal reflux disease)    Headache    Hx of migraine headaches    Hx of migraine headaches    Hypoglycemia    Hypoglycemia    Leukocytosis    Low back pain    Nausea    PE (pulmonary thromboembolism) ([place]) 03/2018   PE (pulmonary thromboembolism) (HCC)    PVC (premature ventricular contraction)    Retinal edema    Retinal edema    RUQ abdominal pain    Tinnitus    Tinnitus    Past Surgical History:  Procedure Laterality Date   ABDOMINAL HYSTERECTOMY     DILATION AND CURETTAGE OF UTERUS     INTRAVASCULAR ULTRASOUND/IVUS N/A 01/12/2019   Procedure: INTRAVASCULAR ULTRASOUND/IVUS;  Surgeon: Waynetta Sandy, MD;  Location: [facility];  Service: Cardiovascular;  Laterality: N/A;   LOWER EXTREMITY VENOGRAPHY Left 01/12/2019   Procedure: LOWER EXTREMITY VENOGRAPHY;  Surgeon: Waynetta Sandy, MD;  Location: [facility];  Service: Cardiovascular;   Laterality: Left;   PERIPHERAL VASCULAR INTERVENTION Left 01/12/2019   Procedure: PERIPHERAL VASCULAR INTERVENTION;  Surgeon: Waynetta Sandy, MD;  Location: [facility];  Service: Cardiovascular;  Laterality: Left;  common and external iliac venous Ivus and stent   tubes in ears     age 3   WISDOM TOOTH EXTRACTION     Patient Active Problem List   Diagnosis Date Noted   Palpitations 05/25/2022   DVT (deep venous thrombosis) ([place]) 01/12/2019   PE (pulmonary thromboembolism) ([place]) 04/08/2018   Endometriosis 04/07/2018   Chest pain of uncertain etiology 16/60/6301   Asthma, mild intermittent, well-controlled 03/24/2014   Seasonal and perennial allergic rhinitis 03/24/2014    REFERRING DIAG: M25.511 (ICD-10-CM) - Pain in right shoulder   THERAPY DIAG:  Abnormal posture  Chronic right shoulder pain  Muscle weakness (generalized)  Rationale for Evaluation and Treatment Rehabilitation  PERTINENT HISTORY: PMH: Plavix with Hx of DVT/PE, asthma, allergies, LBP   PRECAUTIONS: None   SUBJECTIVE:    SUBJECTIVE STATEMENT: Needling was good, I am popping all over but I bent over to pick up a Barbie and twisted my back and strained my muscle. This is better today.  PAIN:  Are  you having pain? 1/10 behind the shoulder and shoulder blade   OBJECTIVE: (objective measures completed at initial evaluation unless otherwise dated)  DIAGNOSTIC FINDINGS:  none   PATIENT SURVEYS:  FOTO 75% goal 76%   COGNITION: Overall cognitive status: Within functional limits for tasks assessed                                  SENSATION: WFL   POSTURE: Rt scapula abducted from spine, slightly rounded   UPPER EXTREMITY ROM:    Pt has full ROM in all planes bil     UPPER EXTREMITY MMT:   MMT Right eval Left eval  Shoulder flexion 4 5  Shoulder extension      Shoulder abduction 4 5  Shoulder adduction      Shoulder internal rotation      Shoulder external rotation       Middle trapezius 4/ 4  Lower trapezius 4- 4  Elbow flexion      Elbow extension      Wrist flexion      Wrist extension      Wrist ulnar deviation      Wrist radial deviation      Wrist pronation      Wrist supination      Grip strength (lbs)      (Blank rows = not tested)   SHOULDER SPECIAL TESTS: Impingement tests: Hawkins/Kennedy impingement test: positive  and Painful arc test: negative SLAP lesions: Clunk test: negative Instability tests: Apprehension test: negative Rotator cuff assessment: Empty can test: negative Biceps assessment: Speed's test: negative   JOINT MOBILITY TESTING:  Slight stiffness present for posterior and inferior glides Rt GH joint Limited ribcage springing on Rt T3-T7   PALPATION:  Signif increase in resting tone with TP present: Rt upper trap, levator infraspinatus, teres minor and major, posterior deltoid, lat, rhomboids             TODAY'S TREATMENT:   12/24/22: UBE L2 2x2 PTA present to discuss status and plan Prone:T 2x6 0#, Y small ROM 2x6: thread needle stretch in between and childs pose at end Seated yellow band shoulder ER with towel rolls: 2x6, VC for open collar bones Serratus punch 1# 6x2 Bil Supine bridge with ball squeeze and TA focus 6x Cat/Cow 6x f/b quadruped alt UE/LE 3x Bil VC to not lock out elbows  12/19/22: UBE L2 2x2 PT present to discuss status and plan Trigger Point Dry-Needling  Treatment instructions: Expect mild to moderate muscle soreness. S/S of pneumothorax if dry needled over a lung field, and to seek immediate medical attention should they occur. Patient verbalized understanding of these instructions and education.  Patient Consent Given: Yes Education handout provided: Previously provided Muscles treated: Rt lat, infraspinatus, teres minor, teres major, upper trap Electrical stimulation performed: No Parameters: N/A Treatment response/outcome: cramp and elongation STM to posterior shoulder and upper trap  after DN, ribcage springing Rt, thoracic broadening and elongation of Rt paraspinal Red tband row and bil shoulder ext 1x10 (progressed from yellow), TC and VC for pelvic alignment under trunk for stacking ribcage and engaging deep core in standing Yellow tband bil shoulder ER x10 (gave single arm for HEP) Standing horiz shoulder abd yellow band x 10 (HEP) Moist heat Rt shoulder x 10' end of session  12/17/22: Arm bike: 1.3 2x2 with PTA present to discuss current status.  Cat/cow 10x, VC for segmental movement Yoga  thread the Needle pose/stretch 3x 3 breaths Bil Supine serratus punches 10x Bil Supine horizontal abd with yellow TB: 2x5 focusing on rhomboid contractions without tensing her neck.  Supine Pilates breath with TA contraction and focusing on not tensing her upper traps. Standing yellow band rows 2x10, extensions alternating arms 10x  12/12/22: Arm bike L2 2x2 PT present to discuss status Trigger Point Dry-Needling  Treatment instructions: Expect mild to moderate muscle soreness. S/S of pneumothorax if dry needled over a lung field, and to seek immediate medical attention should they occur. Patient verbalized understanding of these instructions and education.  Patient Consent Given: Yes Education handout provided: Previously provided Muscles treated: Rt upper trap Electrical stimulation performed: No Parameters: N/A Treatment response/outcome: twitch and elongation of muscle STM Rt upper trap after DN Prone focused shoulder blade retraction with depression 3x10" holds Seated review of Rt upper trap stretch Moist heat to Rt upper trap seated x 10' end of session                                                                                                       PATIENT EDUCATION: Education details: HEP and DN handout Person educated: Patient Education method: Explanation, Demonstration, Verbal cues, and Handouts Education comprehension: verbalized understanding and returned  demonstration   HOME EXERCISE PROGRAM: Access Code: [email or web address] URL: [email or web address] Date: 12/19/2022 Prepared by: Venetia Night Beuhring  Exercises - Seated Upper Trapezius Stretch  - 1 x daily - 7 x weekly - 1 sets - 2 reps - 20 hold - Seated Levator Scapulae Stretch  - 1 x daily - 7 x weekly - 1 sets - 2 reps - 20 hold - Seated Rhomboid Stretch  - 1 x daily - 7 x weekly - 1 sets - 2 reps - 20 hold - Standing Shoulder Row with Anchored Resistance  - 1 x daily - 7 x weekly - 2 sets - 10 reps - Shoulder extension with resistance - Neutral  - 1 x daily - 7 x weekly - 2 sets - 10 reps - Standing Thoracic Open Book at [place]  - 1 x daily - 7 x weekly - 3 sets - 10 reps - Shoulder External Rotation and Scapular Retraction with Resistance  - 1 x daily - 7 x weekly - 2 sets - 10 reps - Shoulder External Rotation with Anchored Resistance  - 1 x daily - 7 x weekly - 2 sets - 10 reps - Standing Shoulder Horizontal Abduction with Resistance  - 1 x daily - 7 x weekly - 2 sets - 10 reps   ASSESSMENT:   CLINICAL IMPRESSION: Pt reports continued improvement with basically everything. Pt tolerating all her theraputic exercises well, less soreness reported. She did have a small set back with her back over the weekend when she bent over and twisted to pick up a toy. This has improved. Pt did very well with prone exercises but were not given for HEP yet.    OBJECTIVE IMPAIRMENTS: decreased coordination, decreased strength, increased muscle spasms, impaired flexibility, postural dysfunction, and pain.    ACTIVITY  LIMITATIONS: lifting, sleeping, reach over head, and exercise   PARTICIPATION LIMITATIONS: cleaning, laundry, community activity, and exercise   PERSONAL FACTORS: Time since onset of injury/illness/exacerbation are also affecting patient's functional outcome.    REHAB POTENTIAL: Excellent   CLINICAL DECISION MAKING: Stable/uncomplicated   EVALUATION COMPLEXITY: Low      GOALS: Goals reviewed with patient? Yes   SHORT TERM GOALS: Target date: 12/25/22   Pt will be ind with initial HEP Baseline: Goal status: Goal met 12/10/22   2.  Pt will report at least 20% less pain with functional use of Rt UE. Baseline:  Goal status: Goal met 12/10/22   3.  Pt will achieve reduced tightness and trigger point presence by at least 50% along posterior shoulder. Baseline:  Goal status: Goal met 12/10/22   4.  Pt will report improved sleep by at least 20% Baseline:  Goal status: No pain in shoulder but in upper back can still be painful and fingers can get tingly. Partially met      LONG TERM GOALS: Target date: 01/22/23   Pt will be ind with advanced HEP and understand how to avoid exacerbation of symptoms. Baseline:  Goal status: INITIAL   2.  Pt will improve FOTO score to at least 76% to demo improved function. Baseline: 75% Goal status: INITIAL   3.  Pt will be able to sleep on Rt side and back without pain Baseline:  Goal status: INITIAL   4.  Pt will report improved Rt shoulder pain by at least 70% with daily activities Baseline:  Goal status: INITIAL   5.  Pt will be able to perform functional reaching and lifting tasks with Rt UE without pain Baseline:  Goal status: INITIAL       PLAN:   PT FREQUENCY: 1-2x/week   PT DURATION: 8 weeks   PLANNED INTERVENTIONS: Therapeutic exercises, Therapeutic activity, Neuromuscular re-education, Patient/Family education, Self Care, Joint mobilization, Dry Needling, Electrical stimulation, Spinal mobilization, Cryotherapy, Moist heat, Taping, Vasopneumatic device, Ionotophoresis 4mg /ml Dexamethasone, and Manual therapy   PLAN FOR NEXT SESSION:DN, follow up with prone exercises  , PTA 12/24/22 10:57 AM

## 2022-12-24 ENCOUNTER — Encounter: Payer: Self-pay | Admitting: Physical Therapy

## 2022-12-24 ENCOUNTER — Ambulatory Visit: Payer: BC Managed Care – PPO | Admitting: Physical Therapy

## 2022-12-24 DIAGNOSIS — G8929 Other chronic pain: Secondary | ICD-10-CM | POA: Diagnosis not present

## 2022-12-24 DIAGNOSIS — M25511 Pain in right shoulder: Secondary | ICD-10-CM | POA: Diagnosis not present

## 2022-12-24 DIAGNOSIS — R293 Abnormal posture: Secondary | ICD-10-CM | POA: Diagnosis not present

## 2022-12-24 DIAGNOSIS — M6281 Muscle weakness (generalized): Secondary | ICD-10-CM

## 2022-12-26 ENCOUNTER — Encounter: Payer: Self-pay | Admitting: Physical Therapy

## 2022-12-26 ENCOUNTER — Ambulatory Visit: Payer: BC Managed Care – PPO | Admitting: Physical Therapy

## 2022-12-26 DIAGNOSIS — G8929 Other chronic pain: Secondary | ICD-10-CM | POA: Diagnosis not present

## 2022-12-26 DIAGNOSIS — R293 Abnormal posture: Secondary | ICD-10-CM | POA: Diagnosis not present

## 2022-12-26 DIAGNOSIS — M6281 Muscle weakness (generalized): Secondary | ICD-10-CM

## 2022-12-26 DIAGNOSIS — M25511 Pain in right shoulder: Secondary | ICD-10-CM | POA: Diagnosis not present

## 2022-12-26 NOTE — Therapy (Signed)
OUTPATIENT PHYSICAL THERAPY TREATMENT NOTE   Patient Name: Sierra Olsen MRN: [id number] DOB:[birth year], 47 y.o., female Today's Date: 12/26/2022  PCP: Jarrett Soho, PA-C  REFERRING PROVIDER: Jarrett Soho, PA-C   END Of SESSION:   PT End of Session - 12/26/22 1234     Visit Number 9    Date for PT Re-Evaluation 01/22/23    Authorization Type BCBS    PT Start Time 1232    PT Stop Time 1316    PT Time Calculation (min) 44 min    Activity Tolerance Patient tolerated treatment well    Behavior During Therapy WFL for tasks assessed/performed                    Past Medical History:  Diagnosis Date   Allergy    Anxiety    Anxiety    Asthma    Celiac disease    Celiac disease    DVT (deep venous thrombosis) (HCC)    Of iliac vein of LLE   DVT (deep venous thrombosis) (HCC) 01/12/2019   Endometriosis    Endometriosis    Environmental allergies    Epigastric pain    Family history of adverse reaction to anesthesia    SISTER HAD TROUBLE BREATHING   GERD (gastroesophageal reflux disease)    Headache    Hx of migraine headaches    Hx of migraine headaches    Hypoglycemia    Hypoglycemia    Leukocytosis    Low back pain    Nausea    PE (pulmonary thromboembolism) (HCC) 03/2018   PE (pulmonary thromboembolism) (HCC)    PVC (premature ventricular contraction)    Retinal edema    Retinal edema    RUQ abdominal pain    Tinnitus    Tinnitus    Past Surgical History:  Procedure Laterality Date   ABDOMINAL HYSTERECTOMY     DILATION AND CURETTAGE OF UTERUS     INTRAVASCULAR ULTRASOUND/IVUS N/A 01/12/2019   Procedure: INTRAVASCULAR ULTRASOUND/IVUS;  Surgeon: Maeola Harman, MD;  Location: [facility] INVASIVE CV LAB;  Service: Cardiovascular;  Laterality: N/A;   LOWER EXTREMITY VENOGRAPHY Left 01/12/2019   Procedure: LOWER EXTREMITY VENOGRAPHY;  Surgeon: Maeola Harman, MD;  Location: [facility] INVASIVE CV LAB;  Service: Cardiovascular;   Laterality: Left;   PERIPHERAL VASCULAR INTERVENTION Left 01/12/2019   Procedure: PERIPHERAL VASCULAR INTERVENTION;  Surgeon: Maeola Harman, MD;  Location: [facility] INVASIVE CV LAB;  Service: Cardiovascular;  Laterality: Left;  common and external iliac venous Ivus and stent   tubes in ears     age 39   WISDOM TOOTH EXTRACTION     Patient Active Problem List   Diagnosis Date Noted   Palpitations 05/25/2022   DVT (deep venous thrombosis) (HCC) 01/12/2019   PE (pulmonary thromboembolism) (HCC) 04/08/2018   Endometriosis 04/07/2018   Chest pain of uncertain etiology 04/09/2017   Asthma, mild intermittent, well-controlled 03/24/2014   Seasonal and perennial allergic rhinitis 03/24/2014    REFERRING DIAG: M25.511 (ICD-10-CM) - Pain in right shoulder   THERAPY DIAG:  Abnormal posture  Chronic right shoulder pain  Muscle weakness (generalized)  Rationale for Evaluation and Treatment Rehabilitation  PERTINENT HISTORY: PMH: Plavix with Hx of DVT/PE, asthma, allergies, LBP   PRECAUTIONS: None   SUBJECTIVE:    SUBJECTIVE STATEMENT: I am feeling much better overall.  No pain after last session.  I continue to have a single knot inside the Rt shoulder blade.  The theracane only helps that so  much.    PAIN:  Are you having pain? 0/10 behind the shoulder and shoulder blade   OBJECTIVE: (objective measures completed at initial evaluation unless otherwise dated)  DIAGNOSTIC FINDINGS:  none   PATIENT SURVEYS:  FOTO 75% goal 76%   COGNITION: Overall cognitive status: Within functional limits for tasks assessed                                  SENSATION: WFL   POSTURE: Rt scapula abducted from spine, slightly rounded   UPPER EXTREMITY ROM:    Pt has full ROM in all planes bil     UPPER EXTREMITY MMT:   MMT Right eval Left eval  Shoulder flexion 4 5  Shoulder extension      Shoulder abduction 4 5  Shoulder adduction      Shoulder internal rotation      Shoulder  external rotation      Middle trapezius 4/ 4  Lower trapezius 4- 4  Elbow flexion      Elbow extension      Wrist flexion      Wrist extension      Wrist ulnar deviation      Wrist radial deviation      Wrist pronation      Wrist supination      Grip strength (lbs)      (Blank rows = not tested)   SHOULDER SPECIAL TESTS: Impingement tests: Hawkins/Kennedy impingement test: positive  and Painful arc test: negative SLAP lesions: Clunk test: negative Instability tests: Apprehension test: negative Rotator cuff assessment: Empty can test: negative Biceps assessment: Speed's test: negative   JOINT MOBILITY TESTING:  Slight stiffness present for posterior and inferior glides Rt GH joint Limited ribcage springing on Rt T3-T7   PALPATION:  Signif increase in resting tone with TP present: Rt upper trap, levator infraspinatus, teres minor and major, posterior deltoid, lat, rhomboids             TODAY'S TREATMENT:  12/26/22: UBE L2.5 2x2 PT present to discuss status Gr V Rt costotransverse joint T3, followed by costotransverse joint mobs on Rt T2-T6 Gr II/III and STM to Rt intrascapular region, prone Review of prone Y - Pt using deltoids vs lower traps so d/c'd Lower trap setting off wall x 10, TC at scapulae for proper muscle recruitment Lower trap forearm slides along wall lat and upward diagonal x 5 each bil green loop band Bent over bil row standing on red tband x 10 Standing row green tband x 10 Standing bil shoulder ER and horiz abd green band (trial for progression of resistance) Pt education: pt has yellow, red and green bands for HEP.  PT encouraged Pt to focus on posture and proper muscle recruitment and if confident with that, can progress resistance Updated HEP: bent over red tband row and lower trap setting off wall  12/24/22: UBE L2 2x2 PTA present to discuss status and plan Prone:T 2x6 0#, Y small ROM 2x6: thread needle stretch in between and childs pose at end Seated  yellow band shoulder ER with towel rolls: 2x6, VC for open collar bones Serratus punch 1# 6x2 Bil Supine bridge with ball squeeze and TA focus 6x Cat/Cow 6x f/b quadruped alt UE/LE 3x Bil VC to not lock out elbows  12/19/22: UBE L2 2x2 PT present to discuss status and plan Trigger Point Dry-Needling  Treatment instructions: Expect mild to moderate muscle  soreness. S/S of pneumothorax if dry needled over a lung field, and to seek immediate medical attention should they occur. Patient verbalized understanding of these instructions and education.  Patient Consent Given: Yes Education handout provided: Previously provided Muscles treated: Rt lat, infraspinatus, teres minor, teres major, upper trap Electrical stimulation performed: No Parameters: N/A Treatment response/outcome: cramp and elongation STM to posterior shoulder and upper trap after DN, ribcage springing Rt, thoracic broadening and elongation of Rt paraspinal Red tband row and bil shoulder ext 1x10 (progressed from yellow), TC and VC for pelvic alignment under trunk for stacking ribcage and engaging deep core in standing Yellow tband bil shoulder ER x10 (gave single arm for HEP) Standing horiz shoulder abd yellow band x 10 (HEP) Moist heat Rt shoulder x 10' end of session                                                                                              PATIENT EDUCATION: Education details: HEP and DN handout Person educated: Patient Education method: Explanation, Demonstration, Verbal cues, and Handouts Education comprehension: verbalized understanding and returned demonstration   HOME EXERCISE PROGRAM: Access Code: [email or web address] URL: [email or web address] Date: 12/19/2022 Prepared by: Venetia Night Latalia Etzler  Exercises - Seated Upper Trapezius Stretch  - 1 x daily - 7 x weekly - 1 sets - 2 reps - 20 hold - Seated Levator Scapulae Stretch  - 1 x daily - 7 x weekly - 1 sets - 2 reps - 20 hold - Seated Rhomboid  Stretch  - 1 x daily - 7 x weekly - 1 sets - 2 reps - 20 hold - Standing Shoulder Row with Anchored Resistance  - 1 x daily - 7 x weekly - 2 sets - 10 reps - Shoulder extension with resistance - Neutral  - 1 x daily - 7 x weekly - 2 sets - 10 reps - Standing Thoracic Open Book at [place]  - 1 x daily - 7 x weekly - 3 sets - 10 reps - Shoulder External Rotation and Scapular Retraction with Resistance  - 1 x daily - 7 x weekly - 2 sets - 10 reps - Shoulder External Rotation with Anchored Resistance  - 1 x daily - 7 x weekly - 2 sets - 10 reps - Standing Shoulder Horizontal Abduction with Resistance  - 1 x daily - 7 x weekly - 2 sets - 10 reps   ASSESSMENT:   CLINICAL IMPRESSION:  Pt has had much less pain at work and is sleeping more comfortably.  Pt has been compliant with HEP and reports soreness of target muscles today.  She has hypomobility of Rt ribcage T2-T6 and in fact had a cavitation today with manipulation at T3 costotransverse joint.  She is working on finding her lower traps and was too dominant with posterior deltoid with prone Y so adjusted to off the wall lower trap setting therex.  Discussed progression of resistance for HEP tbands and added bent over row for functional lifting strength.   OBJECTIVE IMPAIRMENTS: decreased coordination, decreased strength, increased muscle spasms, impaired flexibility, postural dysfunction, and pain.    ACTIVITY LIMITATIONS:  lifting, sleeping, reach over head, and exercise   PARTICIPATION LIMITATIONS: cleaning, laundry, community activity, and exercise   PERSONAL FACTORS: Time since onset of injury/illness/exacerbation are also affecting patient's functional outcome.    REHAB POTENTIAL: Excellent   CLINICAL DECISION MAKING: Stable/uncomplicated   EVALUATION COMPLEXITY: Low     GOALS: Goals reviewed with patient? Yes   SHORT TERM GOALS: Target date: 12/25/22   Pt will be ind with initial HEP Baseline: Goal status: Goal met 12/10/22   2.   Pt will report at least 20% less pain with functional use of Rt UE. Baseline:  Goal status: Goal met 12/10/22   3.  Pt will achieve reduced tightness and trigger point presence by at least 50% along posterior shoulder. Baseline:  Goal status: Goal met 12/10/22   4.  Pt will report improved sleep by at least 20% Baseline:  Goal status: No pain in shoulder but in upper back can still be painful and fingers can get tingly. Partially met      LONG TERM GOALS: Target date: 01/22/23   Pt will be ind with advanced HEP and understand how to avoid exacerbation of symptoms. Baseline:  Goal status: INITIAL   2.  Pt will improve FOTO score to at least 76% to demo improved function. Baseline: 75% Goal status: INITIAL   3.  Pt will be able to sleep on Rt side and back without pain Baseline:  Goal status: ongoing   4.  Pt will report improved Rt shoulder pain by at least 70% with daily activities Baseline:  Goal status: INITIAL   5.  Pt will be able to perform functional reaching and lifting tasks with Rt UE without pain Baseline:  Goal status: INITIAL       PLAN:   PT FREQUENCY: 1-2x/week   PT DURATION: 8 weeks   PLANNED INTERVENTIONS: Therapeutic exercises, Therapeutic activity, Neuromuscular re-education, Patient/Family education, Self Care, Joint mobilization, Dry Needling, Electrical stimulation, Spinal mobilization, Cryotherapy, Moist heat, Taping, Vasopneumatic device, Ionotophoresis 4mg /ml Dexamethasone, and Manual therapy   PLAN FOR NEXT SESSION:DN, follow up with prone exercises  Hayato Guaman, PT 12/26/22 1:32 PM

## 2022-12-30 NOTE — Therapy (Unsigned)
OUTPATIENT PHYSICAL THERAPY TREATMENT NOTE   Patient Name: Sierra Olsen MRN: [id number] DOB:06/19/1976, 47 y.o., female Today's Date: 12/31/2022  PCP: Marda Stalker, PA-C  REFERRING PROVIDER: Marda Stalker, PA-C   END Of SESSION:   PT End of Session - 12/31/22 0934     Visit Number 10    Date for PT Re-Evaluation 01/22/23    Authorization Type BCBS    PT Start Time [phone]    PT Stop Time 1010    PT Time Calculation (min) 39 min    Activity Tolerance Patient tolerated treatment well    Behavior During Therapy [facility] for tasks assessed/performed                     Past Medical History:  Diagnosis Date   Allergy    Anxiety    Anxiety    Asthma    Celiac disease    Celiac disease    DVT (deep venous thrombosis) (HCC)    Of iliac vein of LLE   DVT (deep venous thrombosis) ([place]) 01/12/2019   Endometriosis    Endometriosis    Environmental allergies    Epigastric pain    Family history of adverse reaction to anesthesia    SISTER HAD TROUBLE BREATHING   GERD (gastroesophageal reflux disease)    Headache    Hx of migraine headaches    Hx of migraine headaches    Hypoglycemia    Hypoglycemia    Leukocytosis    Low back pain    Nausea    PE (pulmonary thromboembolism) ([place]) 03/2018   PE (pulmonary thromboembolism) (HCC)    PVC (premature ventricular contraction)    Retinal edema    Retinal edema    RUQ abdominal pain    Tinnitus    Tinnitus    Past Surgical History:  Procedure Laterality Date   ABDOMINAL HYSTERECTOMY     DILATION AND CURETTAGE OF UTERUS     INTRAVASCULAR ULTRASOUND/IVUS N/A 01/12/2019   Procedure: INTRAVASCULAR ULTRASOUND/IVUS;  Surgeon: Waynetta Sandy, MD;  Location: [place] CV LAB;  Service: Cardiovascular;  Laterality: N/A;   LOWER EXTREMITY VENOGRAPHY Left 01/12/2019   Procedure: LOWER EXTREMITY VENOGRAPHY;  Surgeon: Waynetta Sandy, MD;  Location: [place] CV LAB;  Service: Cardiovascular;   Laterality: Left;   PERIPHERAL VASCULAR INTERVENTION Left 01/12/2019   Procedure: PERIPHERAL VASCULAR INTERVENTION;  Surgeon: Waynetta Sandy, MD;  Location: [place] CV LAB;  Service: Cardiovascular;  Laterality: Left;  common and external iliac venous Ivus and stent   tubes in ears     age 63   WISDOM TOOTH EXTRACTION     Patient Active Problem List   Diagnosis Date Noted   Palpitations 05/25/2022   DVT (deep venous thrombosis) ([place]) 01/12/2019   PE (pulmonary thromboembolism) ([place]) 04/08/2018   Endometriosis 04/07/2018   Chest pain of uncertain etiology [id number]/7619   Asthma, mild intermittent, well-controlled 03/24/2014   Seasonal and perennial allergic rhinitis 03/24/2014    REFERRING DIAG: M25.511 (ICD-10-CM) - Pain in right shoulder   THERAPY DIAG:  Chronic right shoulder pain  Abnormal posture  Muscle weakness (generalized)  Rationale for Evaluation and Treatment Rehabilitation  PERTINENT HISTORY: PMH: Plavix with Hx of DVT/PE, asthma, allergies, LBP   PRECAUTIONS: None   SUBJECTIVE:    SUBJECTIVE STATEMENT: My rib popping was good. Not very sore, just a little. I feel looser and stronger.   PAIN:  Are you having pain? 0/10 behind the shoulder and shoulder blade  OBJECTIVE: (objective measures completed at initial evaluation unless otherwise dated)  DIAGNOSTIC FINDINGS:  none   PATIENT SURVEYS:  FOTO 75% goal 76%   COGNITION: Overall cognitive status: Within functional limits for tasks assessed                                  SENSATION: WFL   POSTURE: Rt scapula abducted from spine, slightly rounded   UPPER EXTREMITY ROM:    Pt has full ROM in all planes bil     UPPER EXTREMITY MMT:   MMT Right eval Left eval  Shoulder flexion 4 5  Shoulder extension      Shoulder abduction 4 5  Shoulder adduction      Shoulder internal rotation      Shoulder external rotation      Middle trapezius 4/ 4  Lower trapezius 4- 4  Elbow flexion       Elbow extension      Wrist flexion      Wrist extension      Wrist ulnar deviation      Wrist radial deviation      Wrist pronation      Wrist supination      Grip strength (lbs)      (Blank rows = not tested)   SHOULDER SPECIAL TESTS: Impingement tests: Hawkins/Kennedy impingement test: positive  and Painful arc test: negative SLAP lesions: Clunk test: negative Instability tests: Apprehension test: negative Rotator cuff assessment: Empty can test: negative Biceps assessment: Speed's test: negative   JOINT MOBILITY TESTING:  Slight stiffness present for posterior and inferior glides Rt GH joint Limited ribcage springing on Rt T3-T7   PALPATION:  Signif increase in resting tone with TP present: Rt upper trap, levator infraspinatus, teres minor and major, posterior deltoid, lat, rhomboids             TODAY'S TREATMENT:   12/31/22: Arm bike 1.3 for tissue prep, warm up 2x2 with PTA present to discuss current status. V slides up wall for ROM10x, then pull off the wall 3 sec hol d10x Prone: T with 1# 2x10, Y 0# single arm 6x2 Bil Supine serratus punch 2# Bil 10x Supine bridge with emphasis on core 3 sec hold 10x Counter top push ups 6x2 Bounce the green ball overhead on the wall 30 sec 2x Sidelying open book stretch 10x Bil   12/26/22: UBE L2.5 2x2 PT present to discuss status Gr V Rt costotransverse joint T3, followed by costotransverse joint mobs on Rt T2-T6 Gr II/III and STM to Rt intrascapular region, prone Review of prone Y - Pt using deltoids vs lower traps so d/c'd Lower trap setting off wall x 10, TC at scapulae for proper muscle recruitment Lower trap forearm slides along wall lat and upward diagonal x 5 each bil green loop band Bent over bil row standing on red tband x 10 Standing row green tband x 10 Standing bil shoulder ER and horiz abd green band (trial for progression of resistance) Pt education: pt has yellow, red and green bands for HEP.  PT encouraged Pt  to focus on posture and proper muscle recruitment and if confident with that, can progress resistance Updated HEP: bent over red tband row and lower trap setting off wall  12/24/22: UBE L2 2x2 PTA present to discuss status and plan Prone:T 2x6 0#, Y small ROM 2x6: thread needle stretch in between and childs pose at end  Seated yellow band shoulder ER with towel rolls: 2x6, VC for open collar bones Serratus punch 1# 6x2 Bil Supine bridge with ball squeeze and TA focus 6x Cat/Cow 6x f/b quadruped alt UE/LE 3x Bil VC to not lock out elbows  PATIENT EDUCATION: Education details: HEP and DN handout Person educated: Patient Education method: Consulting civil engineer, Demonstration, Verbal cues, and Handouts Education comprehension: verbalized understanding and returned demonstration   HOME EXERCISE PROGRAM: Access Code: [email or web address] URL: [email or web address] Date: 12/19/2022 Prepared by: Venetia Night Beuhring  Exercises - Seated Upper Trapezius Stretch  - 1 x daily - 7 x weekly - 1 sets - 2 reps - 20 hold - Seated Levator Scapulae Stretch  - 1 x daily - 7 x weekly - 1 sets - 2 reps - 20 hold - Seated Rhomboid Stretch  - 1 x daily - 7 x weekly - 1 sets - 2 reps - 20 hold - Standing Shoulder Row with Anchored Resistance  - 1 x daily - 7 x weekly - 2 sets - 10 reps - Shoulder extension with resistance - Neutral  - 1 x daily - 7 x weekly - 2 sets - 10 reps - Standing Thoracic Open Book at [place]  - 1 x daily - 7 x weekly - 3 sets - 10 reps - Shoulder External Rotation and Scapular Retraction with Resistance  - 1 x daily - 7 x weekly - 2 sets - 10 reps - Shoulder External Rotation with Anchored Resistance  - 1 x daily - 7 x weekly - 2 sets - 10 reps - Standing Shoulder Horizontal Abduction with Resistance  - 1 x daily - 7 x weekly - 2 sets - 10 reps   ASSESSMENT:   CLINICAL IMPRESSION: Pt arrives with no pain and reports minor shoulder soreness over the weekend. Much improved recruitment of lower trap  usage with prone exercises.  .   OBJECTIVE IMPAIRMENTS: decreased coordination, decreased strength, increased muscle spasms, impaired flexibility, postural dysfunction, and pain.    ACTIVITY LIMITATIONS: lifting, sleeping, reach over head, and exercise   PARTICIPATION LIMITATIONS: cleaning, laundry, community activity, and exercise   PERSONAL FACTORS: Time since onset of injury/illness/exacerbation are also affecting patient's functional outcome.    REHAB POTENTIAL: Excellent   CLINICAL DECISION MAKING: Stable/uncomplicated   EVALUATION COMPLEXITY: Low     GOALS: Goals reviewed with patient? Yes   SHORT TERM GOALS: Target date: 12/25/22   Pt will be ind with initial HEP Baseline: Goal status: Goal met 12/10/22   2.  Pt will report at least 20% less pain with functional use of Rt UE. Baseline:  Goal status: Goal met 12/10/22   3.  Pt will achieve reduced tightness and trigger point presence by at least 50% along posterior shoulder. Baseline:  Goal status: Goal met 12/10/22   4.  Pt will report improved sleep by at least 20% Baseline:  Goal status: No pain in shoulder but in upper back can still be painful and fingers can get tingly. Partially met      LONG TERM GOALS: Target date: 01/22/23   Pt will be ind with advanced HEP and understand how to avoid exacerbation of symptoms. Baseline:  Goal status: INITIAL   2.  Pt will improve FOTO score to at least 76% to demo improved function. Baseline: 75% Goal status: INITIAL   3.  Pt will be able to sleep on Rt side and back without pain Baseline:  Goal status: ongoing   4.  Pt will report improved Rt  shoulder pain by at least 70% with daily activities Baseline:  Goal status: INITIAL   5.  Pt will be able to perform functional reaching and lifting tasks with Rt UE without pain Baseline:  Goal status: INITIAL       PLAN:   PT FREQUENCY: 1-2x/week   PT DURATION: 8 weeks   PLANNED INTERVENTIONS: Therapeutic  exercises, Therapeutic activity, Neuromuscular re-education, Patient/Family education, Self Care, Joint mobilization, Dry Needling, Electrical stimulation, Spinal mobilization, Cryotherapy, Moist heat, Taping, Vasopneumatic device, Ionotophoresis 4mg /ml Dexamethasone, and Manual therapy   PLAN FOR NEXT SESSION:DN, follow up with prone exercises and consider MMT in next visit or so.   , PTA 12/31/22 10:08 AM

## 2022-12-31 ENCOUNTER — Ambulatory Visit: Payer: BC Managed Care – PPO | Attending: Family Medicine | Admitting: Physical Therapy

## 2022-12-31 ENCOUNTER — Encounter: Payer: Self-pay | Admitting: Physical Therapy

## 2022-12-31 DIAGNOSIS — R293 Abnormal posture: Secondary | ICD-10-CM | POA: Insufficient documentation

## 2022-12-31 DIAGNOSIS — G8929 Other chronic pain: Secondary | ICD-10-CM | POA: Diagnosis not present

## 2022-12-31 DIAGNOSIS — M25511 Pain in right shoulder: Secondary | ICD-10-CM | POA: Insufficient documentation

## 2022-12-31 DIAGNOSIS — M6281 Muscle weakness (generalized): Secondary | ICD-10-CM | POA: Diagnosis not present

## 2023-01-02 ENCOUNTER — Encounter: Payer: Self-pay | Admitting: Physical Therapy

## 2023-01-02 ENCOUNTER — Ambulatory Visit: Payer: BC Managed Care – PPO | Admitting: Physical Therapy

## 2023-01-02 DIAGNOSIS — M25511 Pain in right shoulder: Secondary | ICD-10-CM | POA: Diagnosis not present

## 2023-01-02 DIAGNOSIS — G8929 Other chronic pain: Secondary | ICD-10-CM | POA: Diagnosis not present

## 2023-01-02 DIAGNOSIS — M6281 Muscle weakness (generalized): Secondary | ICD-10-CM | POA: Diagnosis not present

## 2023-01-02 DIAGNOSIS — R293 Abnormal posture: Secondary | ICD-10-CM

## 2023-01-02 NOTE — Therapy (Signed)
OUTPATIENT PHYSICAL THERAPY TREATMENT NOTE   Patient Name: Sierra Olsen MRN: [id number] DOB:1976/04/15, 47 y.o., female Today's Date: 01/02/2023  PCP: Marda Stalker, PA-C  REFERRING PROVIDER: Marda Stalker, PA-C   END Of SESSION:   PT End of Session - 01/02/23 1021     Visit Number 11    Date for PT Re-Evaluation 01/22/23    Authorization Type BCBS    PT Start Time 1017    PT Stop Time 1100    PT Time Calculation (min) 43 min    Activity Tolerance Patient tolerated treatment well    Behavior During Therapy WFL for tasks assessed/performed                      Past Medical History:  Diagnosis Date   Allergy    Anxiety    Anxiety    Asthma    Celiac disease    Celiac disease    DVT (deep venous thrombosis) (HCC)    Of iliac vein of LLE   DVT (deep venous thrombosis) ([place]) 01/12/2019   Endometriosis    Endometriosis    Environmental allergies    Epigastric pain    Family history of adverse reaction to anesthesia    SISTER HAD TROUBLE BREATHING   GERD (gastroesophageal reflux disease)    Headache    Hx of migraine headaches    Hx of migraine headaches    Hypoglycemia    Hypoglycemia    Leukocytosis    Low back pain    Nausea    PE (pulmonary thromboembolism) ([place]) 03/2018   PE (pulmonary thromboembolism) ([place])    PVC (premature ventricular contraction)    Retinal edema    Retinal edema    RUQ abdominal pain    Tinnitus    Tinnitus    Past Surgical History:  Procedure Laterality Date   ABDOMINAL HYSTERECTOMY     DILATION AND CURETTAGE OF UTERUS     INTRAVASCULAR ULTRASOUND/IVUS N/A 01/12/2019   Procedure: INTRAVASCULAR ULTRASOUND/IVUS;  Surgeon: Waynetta Sandy, MD;  Location: [facility];  Service: Cardiovascular;  Laterality: N/A;   LOWER EXTREMITY VENOGRAPHY Left 01/12/2019   Procedure: LOWER EXTREMITY VENOGRAPHY;  Surgeon: Waynetta Sandy, MD;  Location: [facility];  Service: Cardiovascular;   Laterality: Left;   PERIPHERAL VASCULAR INTERVENTION Left 01/12/2019   Procedure: PERIPHERAL VASCULAR INTERVENTION;  Surgeon: Waynetta Sandy, MD;  Location: [facility];  Service: Cardiovascular;  Laterality: Left;  common and external iliac venous Ivus and stent   tubes in ears     age 16   WISDOM TOOTH EXTRACTION     Patient Active Problem List   Diagnosis Date Noted   Palpitations 05/25/2022   DVT (deep venous thrombosis) ([place]) 01/12/2019   PE (pulmonary thromboembolism) ([place]) 04/08/2018   Endometriosis 04/07/2018   Chest pain of uncertain etiology 42/59/5638   Asthma, mild intermittent, well-controlled 03/24/2014   Seasonal and perennial allergic rhinitis 03/24/2014    REFERRING DIAG: M25.511 (ICD-10-CM) - Pain in right shoulder   THERAPY DIAG:  Chronic right shoulder pain  Muscle weakness (generalized)  Abnormal posture  Rationale for Evaluation and Treatment Rehabilitation  PERTINENT HISTORY: PMH: Plavix with Hx of DVT/PE, asthma, allergies, LBP   PRECAUTIONS: None   SUBJECTIVE:    SUBJECTIVE STATEMENT: By the end of the day I get really sore and uncomfortable in the inside of the Rt shoulder blade  PAIN:  Are you having pain? 0/10 behind the  shoulder and shoulder blade   OBJECTIVE: (objective measures completed at initial evaluation unless otherwise dated)  DIAGNOSTIC FINDINGS:  none   PATIENT SURVEYS:  01/02/23: FOTO 68%, regressed score but this does not match Pt's subjective reports of improvement FOTO 75% goal 76%   COGNITION: Overall cognitive status: Within functional limits for tasks assessed                                  SENSATION: WFL   POSTURE: Rt scapula abducted from spine, slightly rounded   UPPER EXTREMITY ROM:    Pt has full ROM in all planes bil     UPPER EXTREMITY MMT:   MMT Right eval Left eval  Shoulder flexion 4 5  Shoulder extension      Shoulder abduction 4 5  Shoulder adduction      Shoulder  internal rotation      Shoulder external rotation      Middle trapezius 4/ 4  Lower trapezius 4- 4  Elbow flexion      Elbow extension      Wrist flexion      Wrist extension      Wrist ulnar deviation      Wrist radial deviation      Wrist pronation      Wrist supination      Grip strength (lbs)      (Blank rows = not tested)   SHOULDER SPECIAL TESTS: Impingement tests: Hawkins/Kennedy impingement test: positive  and Painful arc test: negative SLAP lesions: Clunk test: negative Instability tests: Apprehension test: negative Rotator cuff assessment: Empty can test: negative Biceps assessment: Speed's test: negative   JOINT MOBILITY TESTING:  Slight stiffness present for posterior and inferior glides Rt GH joint Limited ribcage springing on Rt T3-T7   PALPATION:  Signif increase in resting tone with TP present: Rt upper trap, levator infraspinatus, teres minor and major, posterior deltoid, lat, rhomboids             TODAY'S TREATMENT:  01/02/23: NuStep L4 x 4' Manual therapy: Thoracic PAVMS in Lt SL into bil rotation with and without movement with emphasis on ribcage segmental mobility of T3-T6 for rotational ROM into Lt/Rt rot STM with forearm to Rt posterior shoulder and lat in Lt SL with Rt arm reach off table toward floor Trigger Point Dry-Needling  Treatment instructions: Expect mild to moderate muscle soreness. S/S of pneumothorax if dry needled over a lung field, and to seek immediate medical attention should they occur. Patient verbalized understanding of these instructions and education.  Patient Consent Given: Yes Education handout provided: Yes Muscles treated: Rt upper trap, posterior delt, supraspinatus, infraspinatus Electrical stimulation performed: Yes Parameters: N/A Treatment response/outcome: most reactive in posterior shoulder vs upper trap today Aftercare awareness to report next time given ribcage mobilization today - discussion around all the attachment  sites of ribs anteriorly and posteriorly and muscles that influence ribcage  12/31/22: Arm bike 1.3 for tissue prep, warm up 2x2 with PTA present to discuss current status. V slides up wall for ROM10x, then pull off the wall 3 sec hol d10x Prone: T with 1# 2x10, Y 0# single arm 6x2 Bil Supine serratus punch 2# Bil 10x Supine bridge with emphasis on core 3 sec hold 10x Counter top push ups 6x2 Bounce the green ball overhead on the wall 30 sec 2x Sidelying open book stretch 10x Bil   12/26/22: UBE L2.5 2x2  PT present to discuss status Gr V Rt costotransverse joint T3, followed by costotransverse joint mobs on Rt T2-T6 Gr II/III and STM to Rt intrascapular region, prone Review of prone Y - Pt using deltoids vs lower traps so d/c'd Lower trap setting off wall x 10, TC at scapulae for proper muscle recruitment Lower trap forearm slides along wall lat and upward diagonal x 5 each bil green loop band Bent over bil row standing on red tband x 10 Standing row green tband x 10 Standing bil shoulder ER and horiz abd green band (trial for progression of resistance) Pt education: pt has yellow, red and green bands for HEP.  PT encouraged Pt to focus on posture and proper muscle recruitment and if confident with that, can progress resistance Updated HEP: bent over red tband row and lower trap setting off wall  PATIENT EDUCATION: Education details: HEP and DN handout Person educated: Patient Education method: Explanation, Demonstration, Verbal cues, and Handouts Education comprehension: verbalized understanding and returned demonstration   HOME EXERCISE PROGRAM: Access Code: [email or web address] URL: [email or web address] Date: 12/19/2022 Prepared by: Venetia Night Zakaiya Lares  Exercises - Seated Upper Trapezius Stretch  - 1 x daily - 7 x weekly - 1 sets - 2 reps - 20 hold - Seated Levator Scapulae Stretch  - 1 x daily - 7 x weekly - 1 sets - 2 reps - 20 hold - Seated Rhomboid Stretch  - 1 x daily - 7  x weekly - 1 sets - 2 reps - 20 hold - Standing Shoulder Row with Anchored Resistance  - 1 x daily - 7 x weekly - 2 sets - 10 reps - Shoulder extension with resistance - Neutral  - 1 x daily - 7 x weekly - 2 sets - 10 reps - Standing Thoracic Open Book at [place]  - 1 x daily - 7 x weekly - 3 sets - 10 reps - Shoulder External Rotation and Scapular Retraction with Resistance  - 1 x daily - 7 x weekly - 2 sets - 10 reps - Shoulder External Rotation with Anchored Resistance  - 1 x daily - 7 x weekly - 2 sets - 10 reps - Standing Shoulder Horizontal Abduction with Resistance  - 1 x daily - 7 x weekly - 2 sets - 10 reps   ASSESSMENT:   CLINICAL IMPRESSION: Pt reports signif improvement overall but notes continued Rt intrascapular pain with therex and by end of day.  She continues to display ribcage stiffness T3-T6 and some posterior shoulder soft tissue dysfunction on Rt.  Manual techniques used today to target improved segmental contribution of motion into bil rotation.  Pt reported feeling looser end of session and PT noted improved open book with more distribution of motion throughout t-spine. .   OBJECTIVE IMPAIRMENTS: decreased coordination, decreased strength, increased muscle spasms, impaired flexibility, postural dysfunction, and pain.    ACTIVITY LIMITATIONS: lifting, sleeping, reach over head, and exercise   PARTICIPATION LIMITATIONS: cleaning, laundry, community activity, and exercise   PERSONAL FACTORS: Time since onset of injury/illness/exacerbation are also affecting patient's functional outcome.    REHAB POTENTIAL: Excellent   CLINICAL DECISION MAKING: Stable/uncomplicated   EVALUATION COMPLEXITY: Low     GOALS: Goals reviewed with patient? Yes   SHORT TERM GOALS: Target date: 12/25/22   Pt will be ind with initial HEP Baseline: Goal status: Goal met 12/10/22   2.  Pt will report at least 20% less pain with functional use of Rt UE. Baseline:  Goal status: Goal  met  12/10/22   3.  Pt will achieve reduced tightness and trigger point presence by at least 50% along posterior shoulder. Baseline:  Goal status: Goal met 12/10/22   4.  Pt will report improved sleep by at least 20% Baseline:  Goal status: No pain in shoulder but in upper back can still be painful and fingers can get tingly. Partially met      LONG TERM GOALS: Target date: 01/22/23   Pt will be ind with advanced HEP and understand how to avoid exacerbation of symptoms. Baseline:  Goal status: INITIAL   2.  Pt will improve FOTO score to at least 76% to demo improved function. Baseline: 75% Goal status: INITIAL   3.  Pt will be able to sleep on Rt side and back without pain Baseline:  Goal status: ongoing   4.  Pt will report improved Rt shoulder pain by at least 70% with daily activities Baseline:  Goal status: INITIAL   5.  Pt will be able to perform functional reaching and lifting tasks with Rt UE without pain Baseline:  Goal status: INITIAL       PLAN:   PT FREQUENCY: 1-2x/week   PT DURATION: 8 weeks   PLANNED INTERVENTIONS: Therapeutic exercises, Therapeutic activity, Neuromuscular re-education, Patient/Family education, Self Care, Joint mobilization, Dry Needling, Electrical stimulation, Spinal mobilization, Cryotherapy, Moist heat, Taping, Vasopneumatic device, Ionotophoresis 4mg /ml Dexamethasone, and Manual therapy   PLAN FOR NEXT SESSION:DN, follow up with prone exercises and consider MMT in next visit or so.   Makaley Storts, PT 01/02/23 12:46 PM

## 2023-01-07 ENCOUNTER — Ambulatory Visit: Payer: BC Managed Care – PPO | Admitting: Physical Therapy

## 2023-01-07 ENCOUNTER — Encounter: Payer: Self-pay | Admitting: Physical Therapy

## 2023-01-07 DIAGNOSIS — G8929 Other chronic pain: Secondary | ICD-10-CM

## 2023-01-07 DIAGNOSIS — R293 Abnormal posture: Secondary | ICD-10-CM

## 2023-01-07 DIAGNOSIS — M25511 Pain in right shoulder: Secondary | ICD-10-CM | POA: Diagnosis not present

## 2023-01-07 DIAGNOSIS — M6281 Muscle weakness (generalized): Secondary | ICD-10-CM

## 2023-01-07 NOTE — Therapy (Signed)
OUTPATIENT PHYSICAL THERAPY TREATMENT NOTE   Patient Name: Sierra Olsen MRN: [id number] DOB:[birth year], 47 y.o., female Today's Date: 01/07/2023  PCP: Marda Stalker, PA-C  REFERRING PROVIDER: Marda Stalker, PA-C   END Of SESSION:   PT End of Session - 01/07/23 0930     Visit Number 12    Date for PT Re-Evaluation 01/22/23    Authorization Type BCBS    PT Start Time 0930    PT Stop Time 1001    PT Time Calculation (min) 31 min    Activity Tolerance Patient tolerated treatment well;Other (comment)   Limited treatment due to skin reaction/possible allergic reaction to her laundry detergent. (Face, neck, trunk)   Behavior During Therapy WFL for tasks assessed/performed                      Past Medical History:  Diagnosis Date   Allergy    Anxiety    Anxiety    Asthma    Celiac disease    Celiac disease    DVT (deep venous thrombosis) (HCC)    Of iliac vein of LLE   DVT (deep venous thrombosis) (HCC) 01/12/2019   Endometriosis    Endometriosis    Environmental allergies    Epigastric pain    Family history of adverse reaction to anesthesia    SISTER HAD TROUBLE BREATHING   GERD (gastroesophageal reflux disease)    Headache    Hx of migraine headaches    Hx of migraine headaches    Hypoglycemia    Hypoglycemia    Leukocytosis    Low back pain    Nausea    PE (pulmonary thromboembolism) ([place]) 03/2018   PE (pulmonary thromboembolism) (HCC)    PVC (premature ventricular contraction)    Retinal edema    Retinal edema    RUQ abdominal pain    Tinnitus    Tinnitus    Past Surgical History:  Procedure Laterality Date   ABDOMINAL HYSTERECTOMY     DILATION AND CURETTAGE OF UTERUS     INTRAVASCULAR ULTRASOUND/IVUS N/A 01/12/2019   Procedure: INTRAVASCULAR ULTRASOUND/IVUS;  Surgeon: Waynetta Sandy, MD;  Location: [facility];  Service: Cardiovascular;  Laterality: N/A;   LOWER EXTREMITY VENOGRAPHY Left 01/12/2019   Procedure:  LOWER EXTREMITY VENOGRAPHY;  Surgeon: Waynetta Sandy, MD;  Location: [facility];  Service: Cardiovascular;  Laterality: Left;   PERIPHERAL VASCULAR INTERVENTION Left 01/12/2019   Procedure: PERIPHERAL VASCULAR INTERVENTION;  Surgeon: Waynetta Sandy, MD;  Location: [facility];  Service: Cardiovascular;  Laterality: Left;  common and external iliac venous Ivus and stent   tubes in ears     age 89   WISDOM TOOTH EXTRACTION     Patient Active Problem List   Diagnosis Date Noted   Palpitations 05/25/2022   DVT (deep venous thrombosis) ([place]) 01/12/2019   PE (pulmonary thromboembolism) ([place]) 04/08/2018   Endometriosis 04/07/2018   Chest pain of uncertain etiology [id number]   Asthma, mild intermittent, well-controlled 03/24/2014   Seasonal and perennial allergic rhinitis 03/24/2014    REFERRING DIAG: M25.511 (ICD-10-CM) - Pain in right shoulder   THERAPY DIAG:  Chronic right shoulder pain  Abnormal posture  Muscle weakness (generalized)  Rationale for Evaluation and Treatment Rehabilitation  PERTINENT HISTORY: PMH: Plavix with Hx of DVT/PE, asthma, allergies, LBP   PRECAUTIONS: None   SUBJECTIVE:    SUBJECTIVE STATEMENT: I have some kind of rash all over me, I think I am  having a reaction to my laundry detergent. I will try to exercise but we will see. The mobilizations were good but I was sore after.  PAIN:  Are you having pain? 0/10 behind the shoulder and shoulder blade   OBJECTIVE: (objective measures completed at initial evaluation unless otherwise dated)  DIAGNOSTIC FINDINGS:  none   PATIENT SURVEYS:  01/02/23: FOTO 68%, regressed score but this does not match Pt's subjective reports of improvement FOTO 75% goal 76%   COGNITION: Overall cognitive status: Within functional limits for tasks assessed                                  SENSATION: WFL   POSTURE: Rt scapula abducted from spine, slightly rounded   UPPER EXTREMITY ROM:     Pt has full ROM in all planes bil     UPPER EXTREMITY MMT:   MMT Right eval Left eval  Shoulder flexion 4 5  Shoulder extension      Shoulder abduction 4 5  Shoulder adduction      Shoulder internal rotation      Shoulder external rotation      Middle trapezius 4/ 4  Lower trapezius 4- 4  Elbow flexion      Elbow extension      Wrist flexion      Wrist extension      Wrist ulnar deviation      Wrist radial deviation      Wrist pronation      Wrist supination      Grip strength (lbs)      (Blank rows = not tested)   SHOULDER SPECIAL TESTS: Impingement tests: Hawkins/Kennedy impingement test: positive  and Painful arc test: negative SLAP lesions: Clunk test: negative Instability tests: Apprehension test: negative Rotator cuff assessment: Empty can test: negative Biceps assessment: Speed's test: negative   JOINT MOBILITY TESTING:  Slight stiffness present for posterior and inferior glides Rt GH joint Limited ribcage springing on Rt T3-T7   PALPATION:  Signif increase in resting tone with TP present: Rt upper trap, levator infraspinatus, teres minor and major, posterior deltoid, lat, rhomboids             TODAY'S TREATMENT:   01/07/23: Arm bike 1.3 for tissue prep, warm up 2x2 with PTA present to discuss current status. Thoracic extensions/self mobilizations with soft foam roll 10x upper-ish thoracic, then about 1-2 segments distal 10x. Sidelying open book 10x Bil, working more on precision/not moving her pelvis at all.   01/02/23: NuStep L4 x 4' Manual therapy: Thoracic PAVMS in Lt SL into bil rotation with and without movement with emphasis on ribcage segmental mobility of T3-T6 for rotational ROM into Lt/Rt rot STM with forearm to Rt posterior shoulder and lat in Lt SL with Rt arm reach off table toward floor Trigger Point Dry-Needling  Treatment instructions: Expect mild to moderate muscle soreness. S/S of pneumothorax if dry needled over a lung field, and to  seek immediate medical attention should they occur. Patient verbalized understanding of these instructions and education.  Patient Consent Given: Yes Education handout provided: Yes Muscles treated: Rt upper trap, posterior delt, supraspinatus, infraspinatus Electrical stimulation performed: Yes Parameters: N/A Treatment response/outcome: most reactive in posterior shoulder vs upper trap today Aftercare awareness to report next time given ribcage mobilization today - discussion around all the attachment sites of ribs anteriorly and posteriorly and muscles that influence ribcage  12/31/22: Arm  bike 1.3 for tissue prep, warm up 2x2 with PTA present to discuss current status. V slides up wall for ROM10x, then pull off the wall 3 sec hol d10x Prone: T with 1# 2x10, Y 0# single arm 6x2 Bil Supine serratus punch 2# Bil 10x Supine bridge with emphasis on core 3 sec hold 10x Counter top push ups 6x2 Bounce the green ball overhead on the wall 30 sec 2x Sidelying open book stretch 10x Bil   PATIENT EDUCATION: Education details: HEP and DN handout Person educated: Patient Education method: Consulting civil engineer, Demonstration, Verbal cues, and Handouts Education comprehension: verbalized understanding and returned demonstration   HOME EXERCISE PROGRAM: Access Code: [email or web address] URL: [email or web address] Date: 12/19/2022 Prepared by: Venetia Night Beuhring  Exercises - Seated Upper Trapezius Stretch  - 1 x daily - 7 x weekly - 1 sets - 2 reps - 20 hold - Seated Levator Scapulae Stretch  - 1 x daily - 7 x weekly - 1 sets - 2 reps - 20 hold - Seated Rhomboid Stretch  - 1 x daily - 7 x weekly - 1 sets - 2 reps - 20 hold - Standing Shoulder Row with Anchored Resistance  - 1 x daily - 7 x weekly - 2 sets - 10 reps - Shoulder extension with resistance - Neutral  - 1 x daily - 7 x weekly - 2 sets - 10 reps - Standing Thoracic Open Book at [place]  - 1 x daily - 7 x weekly - 3 sets - 10 reps - Shoulder  External Rotation and Scapular Retraction with Resistance  - 1 x daily - 7 x weekly - 2 sets - 10 reps - Shoulder External Rotation with Anchored Resistance  - 1 x daily - 7 x weekly - 2 sets - 10 reps - Standing Shoulder Horizontal Abduction with Resistance  - 1 x daily - 7 x weekly - 2 sets - 10 reps   ASSESSMENT:   CLINICAL IMPRESSION:Pt doing well shoulder and back wise but she arrives with an allergic reaction to her laundry detergent which was a big distraction for her today. She was not "itching" too bad and could complete easy exercises/stretches. We did stop at 30 min as she was getting a little red in the face and a little "itchier." We worked on being very precise with her stretching/movements and continue to mobilize the rib cage .   OBJECTIVE IMPAIRMENTS: decreased coordination, decreased strength, increased muscle spasms, impaired flexibility, postural dysfunction, and pain.    ACTIVITY LIMITATIONS: lifting, sleeping, reach over head, and exercise   PARTICIPATION LIMITATIONS: cleaning, laundry, community activity, and exercise   PERSONAL FACTORS: Time since onset of injury/illness/exacerbation are also affecting patient's functional outcome.    REHAB POTENTIAL: Excellent   CLINICAL DECISION MAKING: Stable/uncomplicated   EVALUATION COMPLEXITY: Low     GOALS: Goals reviewed with patient? Yes   SHORT TERM GOALS: Target date: 12/25/22   Pt will be ind with initial HEP Baseline: Goal status: Goal met 12/10/22   2.  Pt will report at least 20% less pain with functional use of Rt UE. Baseline:  Goal status: Goal met 12/10/22   3.  Pt will achieve reduced tightness and trigger point presence by at least 50% along posterior shoulder. Baseline:  Goal status: Goal met 12/10/22   4.  Pt will report improved sleep by at least 20% Baseline:  Goal status: No pain in shoulder but in upper back can still be painful and fingers can get tingly. Partially met  LONG TERM  GOALS: Target date: 01/22/23   Pt will be ind with advanced HEP and understand how to avoid exacerbation of symptoms. Baseline:  Goal status: INITIAL   2.  Pt will improve FOTO score to at least 76% to demo improved function. Baseline: 75% Goal status: INITIAL   3.  Pt will be able to sleep on Rt side and back without pain Baseline:  Goal status: ongoing   4.  Pt will report improved Rt shoulder pain by at least 70% with daily activities Baseline:  Goal status: INITIAL   5.  Pt will be able to perform functional reaching and lifting tasks with Rt UE without pain Baseline:  Goal status: INITIAL       PLAN:   PT FREQUENCY: 1-2x/week   PT DURATION: 8 weeks   PLANNED INTERVENTIONS: Therapeutic exercises, Therapeutic activity, Neuromuscular re-education, Patient/Family education, Self Care, Joint mobilization, Dry Needling, Electrical stimulation, Spinal mobilization, Cryotherapy, Moist heat, Taping, Vasopneumatic device, Ionotophoresis 40m/ml Dexamethasone, and Manual therapy   PLAN FOR NEXT SESSION:DN, MMT next visit.   JMyrene Galas PTA 01/07/23 10:02 AM

## 2023-01-10 ENCOUNTER — Encounter: Payer: Self-pay | Admitting: Physical Therapy

## 2023-01-10 ENCOUNTER — Ambulatory Visit: Payer: BC Managed Care – PPO | Admitting: Physical Therapy

## 2023-01-10 DIAGNOSIS — M25511 Pain in right shoulder: Secondary | ICD-10-CM | POA: Diagnosis not present

## 2023-01-10 DIAGNOSIS — G8929 Other chronic pain: Secondary | ICD-10-CM | POA: Diagnosis not present

## 2023-01-10 DIAGNOSIS — M6281 Muscle weakness (generalized): Secondary | ICD-10-CM | POA: Diagnosis not present

## 2023-01-10 DIAGNOSIS — R293 Abnormal posture: Secondary | ICD-10-CM | POA: Diagnosis not present

## 2023-01-10 NOTE — Patient Instructions (Signed)
Lat pulldown with band over top of doorway Bent over tricep kickbacks (1lb) Bent over fly (1lb) Bent over shoulder extension - straight arms (1lb) Squat to upright row (5lb) Squat to bicep curl with overhead press (5lb) Forward raise alternating with diagonal raise, thumbs up (1lb) Bird dogs - press supporting hand into floor back up toward the ceiling - hold 3-5 sec each pose - 5 rounds or more

## 2023-01-10 NOTE — Therapy (Signed)
OUTPATIENT PHYSICAL THERAPY TREATMENT NOTE   Patient Name: Sierra Olsen MRN: [id number] DOB:14-Jan-1976, 47 y.o., female Today's Date: 01/10/2023  PCP: Marda Stalker, PA-C  REFERRING PROVIDER: Marda Stalker, PA-C   END Of SESSION:   PT End of Session - 01/10/23 1403     Visit Number 13    Date for PT Re-Evaluation 01/22/23    Authorization Type BCBS    PT Start Time [id number]    PT Stop Time [id number]    PT Time Calculation (min) 44 min    Activity Tolerance Patient tolerated treatment well    Behavior During Therapy WFL for tasks assessed/performed                       Past Medical History:  Diagnosis Date   Allergy    Anxiety    Anxiety    Asthma    Celiac disease    Celiac disease    DVT (deep venous thrombosis) (HCC)    Of iliac vein of LLE   DVT (deep venous thrombosis) ([place]) 01/12/2019   Endometriosis    Endometriosis    Environmental allergies    Epigastric pain    Family history of adverse reaction to anesthesia    SISTER HAD TROUBLE BREATHING   GERD (gastroesophageal reflux disease)    Headache    Hx of migraine headaches    Hx of migraine headaches    Hypoglycemia    Hypoglycemia    Leukocytosis    Low back pain    Nausea    PE (pulmonary thromboembolism) ([place]) 03/2018   PE (pulmonary thromboembolism) (HCC)    PVC (premature ventricular contraction)    Retinal edema    Retinal edema    RUQ abdominal pain    Tinnitus    Tinnitus    Past Surgical History:  Procedure Laterality Date   ABDOMINAL HYSTERECTOMY     DILATION AND CURETTAGE OF UTERUS     INTRAVASCULAR ULTRASOUND/IVUS N/A 01/12/2019   Procedure: INTRAVASCULAR ULTRASOUND/IVUS;  Surgeon: Waynetta Sandy, MD;  Location: [facility];  Service: Cardiovascular;  Laterality: N/A;   LOWER EXTREMITY VENOGRAPHY Left 01/12/2019   Procedure: LOWER EXTREMITY VENOGRAPHY;  Surgeon: Waynetta Sandy, MD;  Location: [facility];  Service: Cardiovascular;   Laterality: Left;   PERIPHERAL VASCULAR INTERVENTION Left 01/12/2019   Procedure: PERIPHERAL VASCULAR INTERVENTION;  Surgeon: Waynetta Sandy, MD;  Location: [facility];  Service: Cardiovascular;  Laterality: Left;  common and external iliac venous Ivus and stent   tubes in ears     age 43   WISDOM TOOTH EXTRACTION     Patient Active Problem List   Diagnosis Date Noted   Palpitations 05/25/2022   DVT (deep venous thrombosis) ([place]) 01/12/2019   PE (pulmonary thromboembolism) ([place]) 04/08/2018   Endometriosis 04/07/2018   Chest pain of uncertain etiology [id number]   Asthma, mild intermittent, well-controlled 03/24/2014   Seasonal and perennial allergic rhinitis 03/24/2014    REFERRING DIAG: M25.511 (ICD-10-CM) - Pain in right shoulder   THERAPY DIAG:  Chronic right shoulder pain  Abnormal posture  Muscle weakness (generalized)  Rationale for Evaluation and Treatment Rehabilitation  PERTINENT HISTORY: PMH: Plavix with Hx of DVT/PE, asthma, allergies, LBP   PRECAUTIONS: None   SUBJECTIVE:    SUBJECTIVE STATEMENT: My rash is better - it was my laundry detergent.  I am feeling much better and have been good about my HEP.  I have increased the band  resistance for some of the exercises.  I have been having a lot less pain in the back of the shoulder.  I feel the inside of the shoulder blade by end of day.  PAIN:  Are you having pain? 0/10 behind the shoulder and shoulder blade   OBJECTIVE: (objective measures completed at initial evaluation unless otherwise dated)  DIAGNOSTIC FINDINGS:  none   PATIENT SURVEYS:  01/02/23: FOTO 68%, regressed score but this does not match Pt's subjective reports of improvement FOTO 75% goal 76%   COGNITION: Overall cognitive status: Within functional limits for tasks assessed                                  SENSATION: WFL   POSTURE: Rt scapula abducted from spine, slightly rounded   UPPER EXTREMITY ROM:    Pt has full  ROM in all planes bil     UPPER EXTREMITY MMT:   MMT Right eval Left eval RIGHT 2/15 LEFT 2/15  Shoulder flexion 4 5 5   $ Shoulder extension     5   Shoulder abduction 4 5 4+   Shoulder adduction     5   Shoulder internal rotation     5   Shoulder external rotation     5   Middle trapezius 4 4 4 $ 4+  Lower trapezius 4- 4 4 4  $ Elbow flexion     5   Elbow extension     5   Wrist flexion        Wrist extension        Wrist ulnar deviation        Wrist radial deviation        Wrist pronation        Wrist supination        Grip strength (lbs)        (Blank rows = not tested)   SHOULDER SPECIAL TESTS: Impingement tests: Hawkins/Kennedy impingement test: positive  and Painful arc test: negative SLAP lesions: Clunk test: negative Instability tests: Apprehension test: negative Rotator cuff assessment: Empty can test: negative Biceps assessment: Speed's test: negative   JOINT MOBILITY TESTING:  Slight stiffness present for posterior and inferior glides Rt GH joint Limited ribcage springing on Rt T3-T7   PALPATION:  Signif increase in resting tone with TP present: Rt upper trap, levator infraspinatus, teres minor and major, posterior deltoid, lat, rhomboids             TODAY'S TREATMENT:  01/10/23: UBE L2 2x2 PT present to discuss status Standing lat pulldown 15lb 1x10 Bent over tricep kickbacks 1x10 1lb Bent over fly 1lb bil x 10 Bent over shoulder ext 1lb bil x10 Squat with upright row 5lb bil UE x 10 Squat with bicep curl to overhead press 5lb x 10 PT provided list of therex from today for HEP progression  01/07/23: Arm bike 1.3 for tissue prep, warm up 2x2 with PTA present to discuss current status. Thoracic extensions/self mobilizations with soft foam roll 10x upper-ish thoracic, then about 1-2 segments distal 10x. Sidelying open book 10x Bil, working more on precision/not moving her pelvis at all.   01/02/23: NuStep L4 x 4' Manual therapy: Thoracic PAVMS in Lt SL  into bil rotation with and without movement with emphasis on ribcage segmental mobility of T3-T6 for rotational ROM into Lt/Rt rot STM with forearm to Rt posterior shoulder and lat in Lt SL with Rt  arm reach off table toward floor Trigger Point Dry-Needling  Treatment instructions: Expect mild to moderate muscle soreness. S/S of pneumothorax if dry needled over a lung field, and to seek immediate medical attention should they occur. Patient verbalized understanding of these instructions and education.  Patient Consent Given: Yes Education handout provided: Yes Muscles treated: Rt upper trap, posterior delt, supraspinatus, infraspinatus Electrical stimulation performed: Yes Parameters: N/A Treatment response/outcome: most reactive in posterior shoulder vs upper trap today Aftercare awareness to report next time given ribcage mobilization today - discussion around all the attachment sites of ribs anteriorly and posteriorly and muscles that influence ribcage   PATIENT EDUCATION: Education details: HEP and DN handout Person educated: Patient Education method: Explanation, Demonstration, Verbal cues, and Handouts Education comprehension: verbalized understanding and returned demonstration   HOME EXERCISE PROGRAM: Access Code: [email or web address] URL: [email or web address] Date: 12/19/2022 Prepared by: Venetia Night Lean Jaeger  Exercises - Seated Upper Trapezius Stretch  - 1 x daily - 7 x weekly - 1 sets - 2 reps - 20 hold - Seated Levator Scapulae Stretch  - 1 x daily - 7 x weekly - 1 sets - 2 reps - 20 hold - Seated Rhomboid Stretch  - 1 x daily - 7 x weekly - 1 sets - 2 reps - 20 hold - Standing Shoulder Row with Anchored Resistance  - 1 x daily - 7 x weekly - 2 sets - 10 reps - Shoulder extension with resistance - Neutral  - 1 x daily - 7 x weekly - 2 sets - 10 reps - Standing Thoracic Open Book at [place]  - 1 x daily - 7 x weekly - 3 sets - 10 reps - Shoulder External Rotation and Scapular  Retraction with Resistance  - 1 x daily - 7 x weekly - 2 sets - 10 reps - Shoulder External Rotation with Anchored Resistance  - 1 x daily - 7 x weekly - 2 sets - 10 reps - Standing Shoulder Horizontal Abduction with Resistance  - 1 x daily - 7 x weekly - 2 sets - 10 reps  HEP additions from 01/10/23:  Standing lat pulldown 15lb 1x10 Bent over tricep kickbacks 1x10 1lb Bent over fly 1lb bil x 10 Bent over shoulder ext 1lb bil x10 Squat with upright row 5lb bil UE x 10 Squat with bicep curl to overhead press 5lb x 10   ASSESSMENT:   CLINICAL IMPRESSION: Pt was recovered from allergic reaction to laundry detergent.  She reports she is feeling much less pain behind Rt shoulder.  MMT reveals much improved strength around [facility] joint and improving strength of Rt abduction, middle and lower traps.  PT advanced therex to include weight training with low reps and low weights for form and proper muscle recruitment priority.  She demo'd excellent stabilization and activation around scapulae with long level low load weight training today.  Gave list of therex for HEP.  PT and Pt very encouraged by her progress to date.   .   OBJECTIVE IMPAIRMENTS: decreased coordination, decreased strength, increased muscle spasms, impaired flexibility, postural dysfunction, and pain.    ACTIVITY LIMITATIONS: lifting, sleeping, reach over head, and exercise   PARTICIPATION LIMITATIONS: cleaning, laundry, community activity, and exercise   PERSONAL FACTORS: Time since onset of injury/illness/exacerbation are also affecting patient's functional outcome.    REHAB POTENTIAL: Excellent   CLINICAL DECISION MAKING: Stable/uncomplicated   EVALUATION COMPLEXITY: Low     GOALS: Goals reviewed with patient? Yes   SHORT TERM GOALS: Target date:  12/25/22   Pt will be ind with initial HEP Baseline: Goal status: Goal met 12/10/22   2.  Pt will report at least 20% less pain with functional use of Rt UE. Baseline:  Goal  status: Goal met 12/10/22   3.  Pt will achieve reduced tightness and trigger point presence by at least 50% along posterior shoulder. Baseline:  Goal status: Goal met 12/10/22   4.  Pt will report improved sleep by at least 20% Baseline:  Goal status: No pain in shoulder but in upper back can still be painful and fingers can get tingly. Partially met      LONG TERM GOALS: Target date: 01/22/23   Pt will be ind with advanced HEP and understand how to avoid exacerbation of symptoms. Baseline:  Goal status: ongoing, progressing   2.  Pt will improve FOTO score to at least 76% to demo improved function. Baseline: 75% Goal status: INITIAL   3.  Pt will be able to sleep on Rt side and back without pain Baseline:  Goal status: ongoing   4.  Pt will report improved Rt shoulder pain by at least 70% with daily activities Baseline:  Goal status: ongoing   5.  Pt will be able to perform functional reaching and lifting tasks with Rt UE without pain Baseline:  Goal status: ongoing       PLAN:   PT FREQUENCY: 1-2x/week   PT DURATION: 8 weeks   PLANNED INTERVENTIONS: Therapeutic exercises, Therapeutic activity, Neuromuscular re-education, Patient/Family education, Self Care, Joint mobilization, Dry Needling, Electrical stimulation, Spinal mobilization, Cryotherapy, Moist heat, Taping, Vasopneumatic device, Ionotophoresis 70m/ml Dexamethasone, and Manual therapy   PLAN FOR NEXT SESSION: manual PT as needed, DN as needed, continue to progress therex  JBaruch Merl PT 01/10/23 3:30 PM

## 2023-01-15 ENCOUNTER — Ambulatory Visit: Payer: BC Managed Care – PPO | Admitting: Physical Therapy

## 2023-01-15 ENCOUNTER — Encounter: Payer: Self-pay | Admitting: Physical Therapy

## 2023-01-15 DIAGNOSIS — M25511 Pain in right shoulder: Secondary | ICD-10-CM | POA: Diagnosis not present

## 2023-01-15 DIAGNOSIS — M6281 Muscle weakness (generalized): Secondary | ICD-10-CM | POA: Diagnosis not present

## 2023-01-15 DIAGNOSIS — G8929 Other chronic pain: Secondary | ICD-10-CM

## 2023-01-15 DIAGNOSIS — R293 Abnormal posture: Secondary | ICD-10-CM

## 2023-01-15 NOTE — Therapy (Signed)
OUTPATIENT PHYSICAL THERAPY TREATMENT NOTE   Patient Name: Sierra Olsen MRN: [id number] DOB:04-Jun-1976, 47 y.o., female Today's Date: 01/15/2023  PCP: Marda Stalker, PA-C  REFERRING PROVIDER: Marda Stalker, PA-C   END Of SESSION:   PT End of Session - 01/15/23 0938     Visit Number 14    Date for PT Re-Evaluation 01/22/23    Authorization Type BCBS    PT Start Time 0935    PT Stop Time 1013    PT Time Calculation (min) 38 min    Activity Tolerance Patient tolerated treatment well    Behavior During Therapy WFL for tasks assessed/performed                        Past Medical History:  Diagnosis Date   Allergy    Anxiety    Anxiety    Asthma    Celiac disease    Celiac disease    DVT (deep venous thrombosis) (HCC)    Of iliac vein of LLE   DVT (deep venous thrombosis) ([place]) 01/12/2019   Endometriosis    Endometriosis    Environmental allergies    Epigastric pain    Family history of adverse reaction to anesthesia    SISTER HAD TROUBLE BREATHING   GERD (gastroesophageal reflux disease)    Headache    Hx of migraine headaches    Hx of migraine headaches    Hypoglycemia    Hypoglycemia    Leukocytosis    Low back pain    Nausea    PE (pulmonary thromboembolism) ([place]) 03/2018   PE (pulmonary thromboembolism) ([place])    PVC (premature ventricular contraction)    Retinal edema    Retinal edema    RUQ abdominal pain    Tinnitus    Tinnitus    Past Surgical History:  Procedure Laterality Date   ABDOMINAL HYSTERECTOMY     DILATION AND CURETTAGE OF UTERUS     INTRAVASCULAR ULTRASOUND/IVUS N/A 01/12/2019   Procedure: INTRAVASCULAR ULTRASOUND/IVUS;  Surgeon: Waynetta Sandy, MD;  Location: [facility];  Service: Cardiovascular;  Laterality: N/A;   LOWER EXTREMITY VENOGRAPHY Left 01/12/2019   Procedure: LOWER EXTREMITY VENOGRAPHY;  Surgeon: Waynetta Sandy, MD;  Location: [facility];  Service: Cardiovascular;   Laterality: Left;   PERIPHERAL VASCULAR INTERVENTION Left 01/12/2019   Procedure: PERIPHERAL VASCULAR INTERVENTION;  Surgeon: Waynetta Sandy, MD;  Location: [facility];  Service: Cardiovascular;  Laterality: Left;  common and external iliac venous Ivus and stent   tubes in ears     age 72   WISDOM TOOTH EXTRACTION     Patient Active Problem List   Diagnosis Date Noted   Palpitations 05/25/2022   DVT (deep venous thrombosis) ([place]) 01/12/2019   PE (pulmonary thromboembolism) ([place]) 04/08/2018   Endometriosis 04/07/2018   Chest pain of uncertain etiology [id number]   Asthma, mild intermittent, well-controlled 03/24/2014   Seasonal and perennial allergic rhinitis 03/24/2014    REFERRING DIAG: M25.511 (ICD-10-CM) - Pain in right shoulder   THERAPY DIAG:  Chronic right shoulder pain  Abnormal posture  Muscle weakness (generalized)  Rationale for Evaluation and Treatment Rehabilitation  PERTINENT HISTORY: PMH: Plavix with Hx of DVT/PE, asthma, allergies, LBP   PRECAUTIONS: None   SUBJECTIVE:    SUBJECTIVE STATEMENT: I didn't do any of the HEP while traveling on vacation.  I stretched this morning.  I got stiff but no pain while traveling.  PAIN:  Are you having pain? 0/10 behind the shoulder and shoulder blade   OBJECTIVE: (objective measures completed at initial evaluation unless otherwise dated)  DIAGNOSTIC FINDINGS:  none   PATIENT SURVEYS:  01/02/23: FOTO 68%, regressed score but this does not match Pt's subjective reports of improvement FOTO 75% goal 76%   COGNITION: Overall cognitive status: Within functional limits for tasks assessed                                  SENSATION: WFL   POSTURE: Rt scapula abducted from spine, slightly rounded   UPPER EXTREMITY ROM:    Pt has full ROM in all planes bil     UPPER EXTREMITY MMT:   MMT Right eval Left eval RIGHT 2/15 LEFT 2/15  Shoulder flexion 4 5 5   $ Shoulder extension     5    Shoulder abduction 4 5 4+   Shoulder adduction     5   Shoulder internal rotation     5   Shoulder external rotation     5   Middle trapezius 4 4 4 $ 4+  Lower trapezius 4- 4 4 4  $ Elbow flexion     5   Elbow extension     5   Wrist flexion        Wrist extension        Wrist ulnar deviation        Wrist radial deviation        Wrist pronation        Wrist supination        Grip strength (lbs)        (Blank rows = not tested)   SHOULDER SPECIAL TESTS: Impingement tests: Hawkins/Kennedy impingement test: positive  and Painful arc test: negative SLAP lesions: Clunk test: negative Instability tests: Apprehension test: negative Rotator cuff assessment: Empty can test: negative Biceps assessment: Speed's test: negative   JOINT MOBILITY TESTING:  Slight stiffness present for posterior and inferior glides Rt GH joint Limited ribcage springing on Rt T3-T7   PALPATION:  Signif increase in resting tone with TP present: Rt upper trap, levator infraspinatus, teres minor and major, posterior deltoid, lat, rhomboids             TODAY'S TREATMENT:  01/15/23: UBE L2 2x2 PT present to discuss status Seated ball rollouts for trunk flexion and flexion/SB Rt/Lt 2 rounds x10 sec each way Standing SB A/ROM x10 Seated trunk rotation with overpressure 3x5" bil Cat/cow 5x5" Standing row and tricep kickback green band x 10 Standing alt fwd/diagonal raise x10 1lb Bent over fly x10 lb Bent over shoulder extension x 10 1lb Squat with upright row 5lb bil UE x 10 Squat with bicep curl to overhead press 5lb x 10 Counter plank with knee driver march x 20 Counter plank with hip ext alt LE x 10 Bird dog review  01/10/23: UBE L2 2x2 PT present to discuss status Standing lat pulldown 15lb 1x10 Bent over tricep kickbacks 1x10 1lb Bent over fly 1lb bil x 10 Bent over shoulder ext 1lb bil x10 Squat with upright row 5lb bil UE x 10 Squat with bicep curl to overhead press 5lb x 10 PT provided list of  therex from today for HEP progression  01/07/23: Arm bike 1.3 for tissue prep, warm up 2x2 with PTA present to discuss current status. Thoracic extensions/self mobilizations with soft foam roll 10x upper-ish thoracic, then about  1-2 segments distal 10x. Sidelying open book 10x Bil, working more on precision/not moving her pelvis at all.  PATIENT EDUCATION: Education details: HEP and DN handout Person educated: Patient Education method: Explanation, Demonstration, Verbal cues, and Handouts Education comprehension: verbalized understanding and returned demonstration   HOME EXERCISE PROGRAM: Access Code: [email or web address] URL: [email or web address] Date: 12/19/2022 Prepared by: Venetia Night Abriana Saltos  Exercises - Seated Upper Trapezius Stretch  - 1 x daily - 7 x weekly - 1 sets - 2 reps - 20 hold - Seated Levator Scapulae Stretch  - 1 x daily - 7 x weekly - 1 sets - 2 reps - 20 hold - Seated Rhomboid Stretch  - 1 x daily - 7 x weekly - 1 sets - 2 reps - 20 hold - Standing Shoulder Row with Anchored Resistance  - 1 x daily - 7 x weekly - 2 sets - 10 reps - Shoulder extension with resistance - Neutral  - 1 x daily - 7 x weekly - 2 sets - 10 reps - Standing Thoracic Open Book at [place]  - 1 x daily - 7 x weekly - 3 sets - 10 reps - Shoulder External Rotation and Scapular Retraction with Resistance  - 1 x daily - 7 x weekly - 2 sets - 10 reps - Shoulder External Rotation with Anchored Resistance  - 1 x daily - 7 x weekly - 2 sets - 10 reps - Standing Shoulder Horizontal Abduction with Resistance  - 1 x daily - 7 x weekly - 2 sets - 10 reps  HEP additions from 01/10/23:  Standing lat pulldown 15lb 1x10 Bent over tricep kickbacks 1x10 1lb Bent over fly 1lb bil x 10 Bent over shoulder ext 1lb bil x10 Squat with upright row 5lb bil UE x 10 Squat with bicep curl to overhead press 5lb x 10   ASSESSMENT:   CLINICAL IMPRESSION: Pt arrived stiff from vacation and getting away from stretches; this  was improved with initial multi-planar A/ROM and stretching.  Pt continues to demo much improved stabilization and mechanics of bil shoulder girdles. PT advancing to higher level stabilization incorporating trunk stabilizers with good tolerance today.  ERO next time with plan to taper visits with more emphasis on HEP between visits. .   OBJECTIVE IMPAIRMENTS: decreased coordination, decreased strength, increased muscle spasms, impaired flexibility, postural dysfunction, and pain.    ACTIVITY LIMITATIONS: lifting, sleeping, reach over head, and exercise   PARTICIPATION LIMITATIONS: cleaning, laundry, community activity, and exercise   PERSONAL FACTORS: Time since onset of injury/illness/exacerbation are also affecting patient's functional outcome.    REHAB POTENTIAL: Excellent   CLINICAL DECISION MAKING: Stable/uncomplicated   EVALUATION COMPLEXITY: Low     GOALS: Goals reviewed with patient? Yes   SHORT TERM GOALS: Target date: 12/25/22   Pt will be ind with initial HEP Baseline: Goal status: Goal met 12/10/22   2.  Pt will report at least 20% less pain with functional use of Rt UE. Baseline:  Goal status: Goal met 12/10/22   3.  Pt will achieve reduced tightness and trigger point presence by at least 50% along posterior shoulder. Baseline:  Goal status: Goal met 12/10/22   4.  Pt will report improved sleep by at least 20% Baseline:  Goal status: No pain in shoulder but in upper back can still be painful and fingers can get tingly. Partially met      LONG TERM GOALS: Target date: 01/22/23   Pt will be ind with advanced HEP and understand  how to avoid exacerbation of symptoms. Baseline:  Goal status: ongoing, progressing   2.  Pt will improve FOTO score to at least 76% to demo improved function. Baseline: 75% Goal status: INITIAL   3.  Pt will be able to sleep on Rt side and back without pain Baseline:  Goal status: ongoing   4.  Pt will report improved Rt shoulder  pain by at least 70% with daily activities Baseline:  Goal status: ongoing   5.  Pt will be able to perform functional reaching and lifting tasks with Rt UE without pain Baseline:  Goal status: ongoing       PLAN:   PT FREQUENCY: 1-2x/week   PT DURATION: 8 weeks   PLANNED INTERVENTIONS: Therapeutic exercises, Therapeutic activity, Neuromuscular re-education, Patient/Family education, Self Care, Joint mobilization, Dry Needling, Electrical stimulation, Spinal mobilization, Cryotherapy, Moist heat, Taping, Vasopneumatic device, Ionotophoresis 20m/ml Dexamethasone, and Manual therapy   PLAN FOR NEXT SESSION: manual PT as needed, DN as needed, continue to progress therex  Kunta Hilleary, PT 01/15/23 10:15 AM

## 2023-01-17 ENCOUNTER — Encounter: Payer: Self-pay | Admitting: Physical Therapy

## 2023-01-17 ENCOUNTER — Ambulatory Visit: Payer: BC Managed Care – PPO | Admitting: Physical Therapy

## 2023-01-17 DIAGNOSIS — G8929 Other chronic pain: Secondary | ICD-10-CM | POA: Diagnosis not present

## 2023-01-17 DIAGNOSIS — M6281 Muscle weakness (generalized): Secondary | ICD-10-CM

## 2023-01-17 DIAGNOSIS — R293 Abnormal posture: Secondary | ICD-10-CM

## 2023-01-17 DIAGNOSIS — M25511 Pain in right shoulder: Secondary | ICD-10-CM | POA: Diagnosis not present

## 2023-01-17 NOTE — Patient Instructions (Signed)
Standing lat pulldown 15lb 1x10 Bent over tricep kickbacks 1x10 1lb Bent over fly 1lb bil x 10 Bent over shoulder ext 1lb bil x10 Squat with upright row 5lb bil UE x 10 Squat with bicep curl to overhead press 5lb x 10 3-way raises 1lb to 90 deg (forward, 45 deg, sides) Sidelying external rotation 1lb x10 Bird dogs 5 rounds opp arm/leg  Counter push ups 2x15 Counter plank with knee driver (diagonal knee march) x20 Counter plank with hip extension (leg lift) x10 alt legs

## 2023-01-17 NOTE — Therapy (Signed)
OUTPATIENT PHYSICAL THERAPY TREATMENT NOTE   Patient Name: Sierra Olsen MRN: [id number] DOB:May 08, 1976, 47 y.o., female Today's Date: 01/17/2023  PCP: Marda Stalker, PA-C  REFERRING PROVIDER: Marda Stalker, PA-C   END Of SESSION:   PT End of Session - 01/17/23 1159     Visit Number 15    Date for PT Re-Evaluation 03/14/23    Authorization Type BCBS    PT Start Time 1152    PT Stop Time 1220    PT Time Calculation (min) 28 min    Activity Tolerance Patient tolerated treatment well    Behavior During Therapy WFL for tasks assessed/performed                         Past Medical History:  Diagnosis Date   Allergy    Anxiety    Anxiety    Asthma    Celiac disease    Celiac disease    DVT (deep venous thrombosis) (HCC)    Of iliac vein of LLE   DVT (deep venous thrombosis) ([place]) 01/12/2019   Endometriosis    Endometriosis    Environmental allergies    Epigastric pain    Family history of adverse reaction to anesthesia    SISTER HAD TROUBLE BREATHING   GERD (gastroesophageal reflux disease)    Headache    Hx of migraine headaches    Hx of migraine headaches    Hypoglycemia    Hypoglycemia    Leukocytosis    Low back pain    Nausea    PE (pulmonary thromboembolism) ([place]) 03/2018   PE (pulmonary thromboembolism) (HCC)    PVC (premature ventricular contraction)    Retinal edema    Retinal edema    RUQ abdominal pain    Tinnitus    Tinnitus    Past Surgical History:  Procedure Laterality Date   ABDOMINAL HYSTERECTOMY     DILATION AND CURETTAGE OF UTERUS     INTRAVASCULAR ULTRASOUND/IVUS N/A 01/12/2019   Procedure: INTRAVASCULAR ULTRASOUND/IVUS;  Surgeon: Waynetta Sandy, MD;  Location: [facility];  Service: Cardiovascular;  Laterality: N/A;   LOWER EXTREMITY VENOGRAPHY Left 01/12/2019   Procedure: LOWER EXTREMITY VENOGRAPHY;  Surgeon: Waynetta Sandy, MD;  Location: [facility];  Service:  Cardiovascular;  Laterality: Left;   PERIPHERAL VASCULAR INTERVENTION Left 01/12/2019   Procedure: PERIPHERAL VASCULAR INTERVENTION;  Surgeon: Waynetta Sandy, MD;  Location: [facility];  Service: Cardiovascular;  Laterality: Left;  common and external iliac venous Ivus and stent   tubes in ears     age 63   WISDOM TOOTH EXTRACTION     Patient Active Problem List   Diagnosis Date Noted   Palpitations 05/25/2022   DVT (deep venous thrombosis) ([place]) 01/12/2019   PE (pulmonary thromboembolism) ([place]) 04/08/2018   Endometriosis 04/07/2018   Chest pain of uncertain etiology [id number]   Asthma, mild intermittent, well-controlled 03/24/2014   Seasonal and perennial allergic rhinitis 03/24/2014    REFERRING DIAG: M25.511 (ICD-10-CM) - Pain in right shoulder   THERAPY DIAG:  Chronic right shoulder pain  Abnormal posture  Muscle weakness (generalized)  Rationale for Evaluation and Treatment Rehabilitation  PERTINENT HISTORY: PMH: Plavix with Hx of DVT/PE, asthma, allergies, LBP   PRECAUTIONS: None   SUBJECTIVE:    SUBJECTIVE STATEMENT:  I was very sore the morning after our last workout but this morning I stretched and did my HEP and I feel great. 90% improvement in  posture, pain, and work tolerance.  PAIN:  Are you having pain? 0/10 behind the shoulder and shoulder blade   OBJECTIVE: (objective measures completed at initial evaluation unless otherwise dated)  DIAGNOSTIC FINDINGS:  none   PATIENT SURVEYS:  01/17/23: FOTO 75%, removed from [place] 01/02/23: FOTO 68%, regressed score but this does not match Pt's subjective reports of improvement FOTO 75% goal 76%   COGNITION: Overall cognitive status: Within functional limits for tasks assessed                                  SENSATION: WFL   POSTURE: Rt scapula abducted from spine, slightly rounded   UPPER EXTREMITY ROM:    Pt has full ROM in all planes bil     UPPER EXTREMITY MMT:   MMT Right eval  Left eval RIGHT 2/15 LEFT 2/15  Shoulder flexion 4 5 5   $ Shoulder extension     5   Shoulder abduction 4 5 4+   Shoulder adduction     5   Shoulder internal rotation     5   Shoulder external rotation     5   Middle trapezius 4 4 4 $ 4+  Lower trapezius 4- 4 4 4  $ Elbow flexion     5   Elbow extension     5   Wrist flexion        Wrist extension        Wrist ulnar deviation        Wrist radial deviation        Wrist pronation        Wrist supination        Grip strength (lbs)        (Blank rows = not tested)   SHOULDER SPECIAL TESTS: Impingement tests: Hawkins/Kennedy impingement test: positive  and Painful arc test: negative SLAP lesions: Clunk test: negative Instability tests: Apprehension test: negative Rotator cuff assessment: Empty can test: negative Biceps assessment: Speed's test: negative   JOINT MOBILITY TESTING:  Slight stiffness present for posterior and inferior glides Rt GH joint Limited ribcage springing on Rt T3-T7   PALPATION:  Signif increase in resting tone with TP present: Rt upper trap, levator infraspinatus, teres minor and major, posterior deltoid, lat, rhomboids             TODAY'S TREATMENT:  01/17/23: UBE L3 2x2 PT present to review goals Rt UE full elevation stretching against wall, 4 slides x5" holds Standing Rt shoulder abduction 1lb 1x10 with elbow bent for shorter lever (some pain) SL Rt shoulder ER 1lb 2x10 3-way raises 1lb (added abduction long lever today) 5 rounds - Pt did better with this than short lever Updated HEP  01/15/23: UBE L2 2x2 PT present to discuss status Seated ball rollouts for trunk flexion and flexion/SB Rt/Lt 2 rounds x10 sec each way Standing SB A/ROM x10 Seated trunk rotation with overpressure 3x5" bil Cat/cow 5x5" Standing row and tricep kickback green band x 10 Standing alt fwd/diagonal raise x10 1lb Bent over fly x10 lb Bent over shoulder extension x 10 1lb Squat with upright row 5lb bil UE x 10 Squat with  bicep curl to overhead press 5lb x 10 Counter plank with knee driver march x 20 Counter plank with hip ext alt LE x 10 Bird dog review  01/10/23: UBE L2 2x2 PT present to discuss status Standing lat pulldown 15lb 1x10 Bent over tricep  kickbacks 1x10 1lb Bent over fly 1lb bil x 10 Bent over shoulder ext 1lb bil x10 Squat with upright row 5lb bil UE x 10 Squat with bicep curl to overhead press 5lb x 10 PT provided list of therex from today for HEP progression   PATIENT EDUCATION: Education details: HEP and DN handout Person educated: Patient Education method: Explanation, Demonstration, Verbal cues, and Handouts Education comprehension: verbalized understanding and returned demonstration   HOME EXERCISE PROGRAM: Access Code: [email or web address] URL: [email or web address] Date: 12/19/2022 Prepared by: Venetia Night Raven Harmes  Exercises - Seated Upper Trapezius Stretch  - 1 x daily - 7 x weekly - 1 sets - 2 reps - 20 hold - Seated Levator Scapulae Stretch  - 1 x daily - 7 x weekly - 1 sets - 2 reps - 20 hold - Seated Rhomboid Stretch  - 1 x daily - 7 x weekly - 1 sets - 2 reps - 20 hold - Standing Shoulder Row with Anchored Resistance  - 1 x daily - 7 x weekly - 2 sets - 10 reps - Shoulder extension with resistance - Neutral  - 1 x daily - 7 x weekly - 2 sets - 10 reps - Standing Thoracic Open Book at [place]  - 1 x daily - 7 x weekly - 3 sets - 10 reps - Shoulder External Rotation and Scapular Retraction with Resistance  - 1 x daily - 7 x weekly - 2 sets - 10 reps - Shoulder External Rotation with Anchored Resistance  - 1 x daily - 7 x weekly - 2 sets - 10 reps - Standing Shoulder Horizontal Abduction with Resistance  - 1 x daily - 7 x weekly - 2 sets - 10 reps  HEP additions from 01/10/23:  Standing lat pulldown 15lb 1x10 Bent over tricep kickbacks 1x10 1lb Bent over fly 1lb bil x 10 Bent over shoulder ext 1lb bil x10 Squat with upright row 5lb bil UE x 10 Squat with bicep curl to  overhead press 5lb x 10 HEP 01/17/23: Standing lat pulldown 15lb 1x10 Bent over tricep kickbacks 1x10 1lb Bent over fly 1lb bil x 10 Bent over shoulder ext 1lb bil x10 Squat with upright row 5lb bil UE x 10 Squat with bicep curl to overhead press 5lb x 10 3-way raises 1lb to 90 deg (forward, 45 deg, sides) Sidelying external rotation 1lb x10 Bird dogs 5 rounds opp arm/leg  Counter push ups 2x15 Counter plank with knee driver (diagonal knee march) x20 Counter plank with hip extension (leg lift) x10 alt legs   ASSESSMENT:   CLINICAL IMPRESSION: Pt has improved pain-free ROM of Rt shoulder and thoracic spine.  Her strength has improved in Rt shoulder and scapular stabilizers with remaining areas of weakness in Rt shoulder abductors, middle trap and lower trap.  HEP updated today to include SL ER and standing abduction both with 1lb.  Pt continues to demo much improved stabilization and mechanics of bil shoulder girdles within functional movement patterns and therex. PT advancing to higher level stabilization incorporating trunk stabilizers.  She is now tolerating closed chain strength of UE without pain and with good muscular activation.  She has advanced HEP recently, using light dumbbells and advanced resistance tband.  She is ready to taper to every other week for PT to monitor and further progress to full readiness to D/C to HEP. Marland Kitchen   OBJECTIVE IMPAIRMENTS: decreased coordination, decreased strength, increased muscle spasms, impaired flexibility, postural dysfunction, and pain.    ACTIVITY LIMITATIONS: lifting,  sleeping, reach over head, and exercise   PARTICIPATION LIMITATIONS: cleaning, laundry, community activity, and exercise   PERSONAL FACTORS: Time since onset of injury/illness/exacerbation are also affecting patient's functional outcome.    REHAB POTENTIAL: Excellent   CLINICAL DECISION MAKING: Stable/uncomplicated   EVALUATION COMPLEXITY: Low     GOALS: Goals reviewed with  patient? Yes   SHORT TERM GOALS: Target date: 12/25/22   Pt will be ind with initial HEP Baseline: Goal status: Goal met 12/10/22   2.  Pt will report at least 20% less pain with functional use of Rt UE. Baseline:  Goal status: Goal met 12/10/22   3.  Pt will achieve reduced tightness and trigger point presence by at least 50% along posterior shoulder. Baseline:  Goal status: Goal met 12/10/22   4.  Pt will report improved sleep by at least 20% Baseline:  Goal status: No pain in shoulder but in upper back can still be painful and fingers can get tingly. Partially met      LONG TERM GOALS: Target date: 03/14/23   Pt will be ind with advanced HEP and understand how to avoid exacerbation of symptoms. Baseline:  Goal status: ongoing, progressing   2.  Pt will improve FOTO score to at least 76% to demo improved function. Baseline: 75% Goal status: ongoing   3.  Pt will be able to sleep on Rt side and back without pain Baseline:  Goal status: MET   4.  Pt will report improved Rt shoulder pain by at least 70% with daily activities Baseline:  Goal status: MET   5.  Pt will be able to perform functional reaching and lifting tasks with Rt UE without pain Baseline:  Goal status: ongoing for heavier lifts       PLAN:   PT FREQUENCY: 1x every other week   PT DURATION: 6 weeks   PLANNED INTERVENTIONS: Therapeutic exercises, Therapeutic activity, Neuromuscular re-education, Patient/Family education, Self Care, Joint mobilization, Dry Needling, Electrical stimulation, Spinal mobilization, Cryotherapy, Moist heat, Taping, Vasopneumatic device, Ionotophoresis 31m/ml Dexamethasone, and Manual therapy   PLAN FOR NEXT SESSION: f/u on HEP, progress Rt shoulder strengthening, manual therapy as needed, transition to ind with HEP  Kenner Lewan, PT 01/17/23 12:25 PM

## 2023-01-18 DIAGNOSIS — F4010 Social phobia, unspecified: Secondary | ICD-10-CM | POA: Diagnosis not present

## 2023-01-23 ENCOUNTER — Ambulatory Visit: Payer: BC Managed Care – PPO | Admitting: Physical Therapy

## 2023-01-23 ENCOUNTER — Encounter: Payer: Self-pay | Admitting: Physical Therapy

## 2023-01-23 DIAGNOSIS — M25511 Pain in right shoulder: Secondary | ICD-10-CM | POA: Diagnosis not present

## 2023-01-23 DIAGNOSIS — M6281 Muscle weakness (generalized): Secondary | ICD-10-CM

## 2023-01-23 DIAGNOSIS — R293 Abnormal posture: Secondary | ICD-10-CM | POA: Diagnosis not present

## 2023-01-23 DIAGNOSIS — G8929 Other chronic pain: Secondary | ICD-10-CM | POA: Diagnosis not present

## 2023-01-23 NOTE — Therapy (Signed)
OUTPATIENT PHYSICAL THERAPY TREATMENT NOTE   Patient Name: Sierra Olsen MRN: [id number] DOB:1976/07/06, 47 y.o., female Today's Date: 01/23/2023  PCP: Marda Stalker, PA-C  REFERRING PROVIDER: Marda Stalker, PA-C   END Of SESSION:   PT End of Session - 01/23/23 1230     Visit Number 16    Date for PT Re-Evaluation 03/14/23    Authorization Type BCBS    PT Start Time [id number]    PT Stop Time [id number]    PT Time Calculation (min) 52 min    Activity Tolerance Patient tolerated treatment well    Behavior During Therapy WFL for tasks assessed/performed                          Past Medical History:  Diagnosis Date   Allergy    Anxiety    Anxiety    Asthma    Celiac disease    Celiac disease    DVT (deep venous thrombosis) (HCC)    Of iliac vein of LLE   DVT (deep venous thrombosis) ([place]) 01/12/2019   Endometriosis    Endometriosis    Environmental allergies    Epigastric pain    Family history of adverse reaction to anesthesia    SISTER HAD TROUBLE BREATHING   GERD (gastroesophageal reflux disease)    Headache    Hx of migraine headaches    Hx of migraine headaches    Hypoglycemia    Hypoglycemia    Leukocytosis    Low back pain    Nausea    PE (pulmonary thromboembolism) ([place]) 03/2018   PE (pulmonary thromboembolism) (HCC)    PVC (premature ventricular contraction)    Retinal edema    Retinal edema    RUQ abdominal pain    Tinnitus    Tinnitus    Past Surgical History:  Procedure Laterality Date   ABDOMINAL HYSTERECTOMY     DILATION AND CURETTAGE OF UTERUS     INTRAVASCULAR ULTRASOUND/IVUS N/A 01/12/2019   Procedure: INTRAVASCULAR ULTRASOUND/IVUS;  Surgeon: Waynetta Sandy, MD;  Location: [facility];  Service: Cardiovascular;  Laterality: N/A;   LOWER EXTREMITY VENOGRAPHY Left 01/12/2019   Procedure: LOWER EXTREMITY VENOGRAPHY;  Surgeon: Waynetta Sandy, MD;  Location: [facility];  Service:  Cardiovascular;  Laterality: Left;   PERIPHERAL VASCULAR INTERVENTION Left 01/12/2019   Procedure: PERIPHERAL VASCULAR INTERVENTION;  Surgeon: Waynetta Sandy, MD;  Location: [facility];  Service: Cardiovascular;  Laterality: Left;  common and external iliac venous Ivus and stent   tubes in ears     age 74   WISDOM TOOTH EXTRACTION     Patient Active Problem List   Diagnosis Date Noted   Palpitations 05/25/2022   DVT (deep venous thrombosis) ([place]) 01/12/2019   PE (pulmonary thromboembolism) ([place]) 04/08/2018   Endometriosis 04/07/2018   Chest pain of uncertain etiology [id number]   Asthma, mild intermittent, well-controlled 03/24/2014   Seasonal and perennial allergic rhinitis 03/24/2014    REFERRING DIAG: M25.511 (ICD-10-CM) - Pain in right shoulder   THERAPY DIAG:  Chronic right shoulder pain  Abnormal posture  Muscle weakness (generalized)  Rationale for Evaluation and Treatment Rehabilitation  PERTINENT HISTORY: PMH: Plavix with Hx of DVT/PE, asthma, allergies, LBP   PRECAUTIONS: None   SUBJECTIVE:    SUBJECTIVE STATEMENT:  I am doing well.  Stretching every day and doing the weights/bands every other day. I have started the other program now too for my  global body strength. I am a little sore in my upper traps today.    PAIN:  Are you having pain? 0/10 behind the shoulder and shoulder blade   OBJECTIVE: (objective measures completed at initial evaluation unless otherwise dated)  DIAGNOSTIC FINDINGS:  none   PATIENT SURVEYS:  01/17/23: FOTO 75%, removed from [place] 01/02/23: FOTO 68%, regressed score but this does not match Pt's subjective reports of improvement FOTO 75% goal 76%   COGNITION: Overall cognitive status: Within functional limits for tasks assessed                                  SENSATION: WFL   POSTURE: Rt scapula abducted from spine, slightly rounded   UPPER EXTREMITY ROM:    Pt has full ROM in all planes bil     UPPER  EXTREMITY MMT:   MMT Right eval Left eval RIGHT 2/15 LEFT 2/15  Shoulder flexion '4 5 5   '$ Shoulder extension     5   Shoulder abduction 4 5 4+   Shoulder adduction     5   Shoulder internal rotation     5   Shoulder external rotation     5   Middle trapezius '4 4 4 '$ 4+  Lower trapezius 4- '4 4 4  '$ Elbow flexion     5   Elbow extension     5   Wrist flexion        Wrist extension        Wrist ulnar deviation        Wrist radial deviation        Wrist pronation        Wrist supination        Grip strength (lbs)        (Blank rows = not tested)   SHOULDER SPECIAL TESTS: Impingement tests: Hawkins/Kennedy impingement test: positive  and Painful arc test: negative SLAP lesions: Clunk test: negative Instability tests: Apprehension test: negative Rotator cuff assessment: Empty can test: negative Biceps assessment: Speed's test: negative   JOINT MOBILITY TESTING:  Slight stiffness present for posterior and inferior glides Rt GH joint Limited ribcage springing on Rt T3-T7   PALPATION:  Signif increase in resting tone with TP present: Rt upper trap, levator infraspinatus, teres minor and major, posterior deltoid, lat, rhomboids             TODAY'S TREATMENT:  01/23/23: UBE L3 3x2 PT present to discuss status Seated upper trap stretch with overpressure: 2x30" bil Standing row 10lb 2x10 Lat pulldowns green tband over top of door x20 (PT demo and VQ for wide arm "W") SL Rt shoulder ER 1lb x10 Trigger Point Dry-Needling  Treatment instructions: Expect mild to moderate muscle soreness. S/S of pneumothorax if dry needled over a lung field, and to seek immediate medical attention should they occur. Patient verbalized understanding of these instructions and education.  Patient Consent Given: Yes Education handout provided: Previously provided Muscles treated: Rt upper trap, teres minor, infraspinatus Electrical stimulation performed: No Parameters: N/A Treatment response/outcome:  signif twitch and release anterior band upper trap and teres minor STM and heat x 10' to posterior shoulder and upper trap   01/17/23: UBE L3 2x2 PT present to review goals Rt UE full elevation stretching against wall, 4 slides x5" holds Standing Rt shoulder abduction 1lb 1x10 with elbow bent for shorter lever (some pain) SL Rt shoulder ER 1lb 2x10  3-way raises 1lb (added abduction long lever today) 5 rounds - Pt did better with this than short lever Updated HEP  01/15/23: UBE L2 2x2 PT present to discuss status Seated ball rollouts for trunk flexion and flexion/SB Rt/Lt 2 rounds x10 sec each way Standing SB A/ROM x10 Seated trunk rotation with overpressure 3x5" bil Cat/cow 5x5" Standing row and tricep kickback green band x 10 Standing alt fwd/diagonal raise x10 1lb Bent over fly x10 lb Bent over shoulder extension x 10 1lb Squat with upright row 5lb bil UE x 10 Squat with bicep curl to overhead press 5lb x 10 Counter plank with knee driver march x 20 Counter plank with hip ext alt LE x 10 Bird dog review   PATIENT EDUCATION: Education details: HEP and DN handout Person educated: Patient Education method: Consulting civil engineer, Media planner, Verbal cues, and Handouts Education comprehension: verbalized understanding and returned demonstration   HOME EXERCISE PROGRAM: Access Code: [email or web address] URL: [email or web address] Date: 12/19/2022 Prepared by: Venetia Night Brendy Ficek  Exercises - Seated Upper Trapezius Stretch  - 1 x daily - 7 x weekly - 1 sets - 2 reps - 20 hold - Seated Levator Scapulae Stretch  - 1 x daily - 7 x weekly - 1 sets - 2 reps - 20 hold - Seated Rhomboid Stretch  - 1 x daily - 7 x weekly - 1 sets - 2 reps - 20 hold - Standing Shoulder Row with Anchored Resistance  - 1 x daily - 7 x weekly - 2 sets - 10 reps - Shoulder extension with resistance - Neutral  - 1 x daily - 7 x weekly - 2 sets - 10 reps - Standing Thoracic Open Book at [place]  - 1 x daily - 7 x weekly  - 3 sets - 10 reps - Shoulder External Rotation and Scapular Retraction with Resistance  - 1 x daily - 7 x weekly - 2 sets - 10 reps - Shoulder External Rotation with Anchored Resistance  - 1 x daily - 7 x weekly - 2 sets - 10 reps - Standing Shoulder Horizontal Abduction with Resistance  - 1 x daily - 7 x weekly - 2 sets - 10 reps  HEP as of 01/17/23: Standing lat pulldown 15lb 1x10 Bent over tricep kickbacks 1x10 1lb Bent over fly 1lb bil x 10 Bent over shoulder ext 1lb bil x10 Squat with upright row 5lb bil UE x 10 Squat with bicep curl to overhead press 5lb x 10 3-way raises 1lb to 90 deg (forward, 45 deg, sides) Sidelying external rotation 1lb x10 Bird dogs 5 rounds opp arm/leg  Counter push ups 2x15 Counter plank with knee driver (diagonal knee march) x20 Counter plank with hip extension (leg lift) x10 alt legs   ASSESSMENT:   CLINICAL IMPRESSION: Pt has been compliant with HEP.  She is stretching daily and doing strength training every other day.  Discussed need for adequate intake of protein within 30' of work out for muscle recovery.  Pt needed review of lat pulldown technique using green band over top of doorway today.  She had some ongoing pain in posterior shoulder with ER and some tension in Rt upper trap so PT performed DN with signif twitch and release of anterior band of upper trap and teres minor on Rt.  Pt to return in approx 2 weeks to check in on HEP and address remaining symptoms. .   OBJECTIVE IMPAIRMENTS: decreased coordination, decreased strength, increased muscle spasms, impaired flexibility, postural dysfunction, and pain.  ACTIVITY LIMITATIONS: lifting, sleeping, reach over head, and exercise   PARTICIPATION LIMITATIONS: cleaning, laundry, community activity, and exercise   PERSONAL FACTORS: Time since onset of injury/illness/exacerbation are also affecting patient's functional outcome.    REHAB POTENTIAL: Excellent   CLINICAL DECISION MAKING:  Stable/uncomplicated   EVALUATION COMPLEXITY: Low     GOALS: Goals reviewed with patient? Yes   SHORT TERM GOALS: Target date: 12/25/22   Pt will be ind with initial HEP Baseline: Goal status: Goal met 12/10/22   2.  Pt will report at least 20% less pain with functional use of Rt UE. Baseline:  Goal status: Goal met 12/10/22   3.  Pt will achieve reduced tightness and trigger point presence by at least 50% along posterior shoulder. Baseline:  Goal status: Goal met 12/10/22   4.  Pt will report improved sleep by at least 20% Baseline:  Goal status: No pain in shoulder but in upper back can still be painful and fingers can get tingly. Partially met      LONG TERM GOALS: Target date: 03/14/23   Pt will be ind with advanced HEP and understand how to avoid exacerbation of symptoms. Baseline:  Goal status: ongoing, progressing   2.  Pt will improve FOTO score to at least 76% to demo improved function. Baseline: 75% Goal status: ongoing   3.  Pt will be able to sleep on Rt side and back without pain Baseline:  Goal status: MET   4.  Pt will report improved Rt shoulder pain by at least 70% with daily activities Baseline:  Goal status: MET   5.  Pt will be able to perform functional reaching and lifting tasks with Rt UE without pain Baseline:  Goal status: ongoing for heavier lifts       PLAN:   PT FREQUENCY: 1x every other week   PT DURATION: 6 weeks   PLANNED INTERVENTIONS: Therapeutic exercises, Therapeutic activity, Neuromuscular re-education, Patient/Family education, Self Care, Joint mobilization, Dry Needling, Electrical stimulation, Spinal mobilization, Cryotherapy, Moist heat, Taping, Vasopneumatic device, Ionotophoresis '4mg'$ /ml Dexamethasone, and Manual therapy   PLAN FOR NEXT SESSION: f/u on HEP, progress Rt shoulder strengthening, manual therapy as needed, transition to ind with HEP  Ameah Chanda, PT 01/23/23 1:17 PM

## 2023-01-29 DIAGNOSIS — F4010 Social phobia, unspecified: Secondary | ICD-10-CM | POA: Diagnosis not present

## 2023-02-01 DIAGNOSIS — F4010 Social phobia, unspecified: Secondary | ICD-10-CM | POA: Diagnosis not present

## 2023-02-07 ENCOUNTER — Encounter: Payer: Self-pay | Admitting: Physical Therapy

## 2023-02-07 ENCOUNTER — Ambulatory Visit: Payer: BC Managed Care – PPO | Attending: Family Medicine | Admitting: Physical Therapy

## 2023-02-07 DIAGNOSIS — G8929 Other chronic pain: Secondary | ICD-10-CM | POA: Insufficient documentation

## 2023-02-07 DIAGNOSIS — M6281 Muscle weakness (generalized): Secondary | ICD-10-CM | POA: Diagnosis not present

## 2023-02-07 DIAGNOSIS — R293 Abnormal posture: Secondary | ICD-10-CM | POA: Diagnosis not present

## 2023-02-07 DIAGNOSIS — M25511 Pain in right shoulder: Secondary | ICD-10-CM | POA: Insufficient documentation

## 2023-02-07 NOTE — Therapy (Signed)
OUTPATIENT PHYSICAL THERAPY TREATMENT NOTE   Patient Name: TIFFANEY BROSSETT MRN: [id number] DOB:February 03, 1976, 47 y.o., female Today's Date: 02/07/2023  PCP: Marda Stalker, PA-C  REFERRING PROVIDER: Marda Stalker, PA-C   END Of SESSION:   PT End of Session - 02/07/23 1233     Visit Number 17    Date for PT Re-Evaluation 03/14/23    Authorization Type BCBS    PT Start Time 1233    PT Stop Time 1324    PT Time Calculation (min) 51 min    Activity Tolerance Patient tolerated treatment well    Behavior During Therapy WFL for tasks assessed/performed                           Past Medical History:  Diagnosis Date   Allergy    Anxiety    Anxiety    Asthma    Celiac disease    Celiac disease    DVT (deep venous thrombosis) (HCC)    Of iliac vein of LLE   DVT (deep venous thrombosis) ([place]) 01/12/2019   Endometriosis    Endometriosis    Environmental allergies    Epigastric pain    Family history of adverse reaction to anesthesia    SISTER HAD TROUBLE BREATHING   GERD (gastroesophageal reflux disease)    Headache    Hx of migraine headaches    Hx of migraine headaches    Hypoglycemia    Hypoglycemia    Leukocytosis    Low back pain    Nausea    PE (pulmonary thromboembolism) ([place]) 03/2018   PE (pulmonary thromboembolism) (HCC)    PVC (premature ventricular contraction)    Retinal edema    Retinal edema    RUQ abdominal pain    Tinnitus    Tinnitus    Past Surgical History:  Procedure Laterality Date   ABDOMINAL HYSTERECTOMY     DILATION AND CURETTAGE OF UTERUS     INTRAVASCULAR ULTRASOUND/IVUS N/A 01/12/2019   Procedure: INTRAVASCULAR ULTRASOUND/IVUS;  Surgeon: Waynetta Sandy, MD;  Location: [facility];  Service: Cardiovascular;  Laterality: N/A;   LOWER EXTREMITY VENOGRAPHY Left 01/12/2019   Procedure: LOWER EXTREMITY VENOGRAPHY;  Surgeon: Waynetta Sandy, MD;  Location: [facility];  Service:  Cardiovascular;  Laterality: Left;   PERIPHERAL VASCULAR INTERVENTION Left 01/12/2019   Procedure: PERIPHERAL VASCULAR INTERVENTION;  Surgeon: Waynetta Sandy, MD;  Location: [facility];  Service: Cardiovascular;  Laterality: Left;  common and external iliac venous Ivus and stent   tubes in ears     age 64   WISDOM TOOTH EXTRACTION     Patient Active Problem List   Diagnosis Date Noted   Palpitations 05/25/2022   DVT (deep venous thrombosis) ([place]) 01/12/2019   PE (pulmonary thromboembolism) ([place]) 04/08/2018   Endometriosis 04/07/2018   Chest pain of uncertain etiology [id number]   Asthma, mild intermittent, well-controlled 03/24/2014   Seasonal and perennial allergic rhinitis 03/24/2014    REFERRING DIAG: M25.511 (ICD-10-CM) - Pain in right shoulder   THERAPY DIAG:  Chronic right shoulder pain  Abnormal posture  Muscle weakness (generalized)  Rationale for Evaluation and Treatment Rehabilitation  PERTINENT HISTORY: PMH: Plavix with Hx of DVT/PE, asthma, allergies, LBP   PRECAUTIONS: None   SUBJECTIVE:    SUBJECTIVE STATEMENT:  I am doing my HEP and working on eating more protein.  I have been increasing my weights. I did the overhead press with  12 pounds.  I am up to 3lb with shoulder ER.  I am very fatigued by end of my workout.  I do feel like I get achey and tense on Rt side so hoping to have that checked out today.    PAIN:  Are you having pain? 0/10 behind the shoulder and shoulder blade   OBJECTIVE: (objective measures completed at initial evaluation unless otherwise dated)  DIAGNOSTIC FINDINGS:  none   PATIENT SURVEYS:  01/17/23: FOTO 75%, removed from [place] 01/02/23: FOTO 68%, regressed score but this does not match Pt's subjective reports of improvement FOTO 75% goal 76%   COGNITION: Overall cognitive status: Within functional limits for tasks assessed                                  SENSATION: WFL   POSTURE: Rt scapula abducted from  spine, slightly rounded   UPPER EXTREMITY ROM:    Pt has full ROM in all planes bil     UPPER EXTREMITY MMT:   MMT Right eval Left eval RIGHT 2/15 LEFT 2/15  Shoulder flexion '4 5 5   '$ Shoulder extension     5   Shoulder abduction 4 5 4+   Shoulder adduction     5   Shoulder internal rotation     5   Shoulder external rotation     5   Middle trapezius '4 4 4 '$ 4+  Lower trapezius 4- '4 4 4  '$ Elbow flexion     5   Elbow extension     5   Wrist flexion        Wrist extension        Wrist ulnar deviation        Wrist radial deviation        Wrist pronation        Wrist supination        Grip strength (lbs)        (Blank rows = not tested)   SHOULDER SPECIAL TESTS: Impingement tests: Hawkins/Kennedy impingement test: positive  and Painful arc test: negative SLAP lesions: Clunk test: negative Instability tests: Apprehension test: negative Rotator cuff assessment: Empty can test: negative Biceps assessment: Speed's test: negative   JOINT MOBILITY TESTING:  Slight stiffness present for posterior and inferior glides Rt GH joint Limited ribcage springing on Rt T3-T7   PALPATION:  Signif increase in resting tone with TP present: Rt upper trap, levator infraspinatus, teres minor and major, posterior deltoid, lat, rhomboids             TODAY'S TREATMENT:  02/07/23: UBE L3 3x2 PT present to discuss status Lower trunk rotation x10, hip windshield wipers x10 DKTC with foam roller under pelvis for lower lumbar flexion Manual therapy: Right ribcage springing Gr II/III, costovertebral joint PA T1-T3 Gr II/III Trigger Point Dry-Needling  Treatment instructions: Expect mild to moderate muscle soreness. S/S of pneumothorax if dry needled over a lung field, and to seek immediate medical attention should they occur. Patient verbalized understanding of these instructions and education.  Patient Consent Given: Yes Education handout provided: Previously provided Muscles treated: Rt upper  trap, infraspinatus Electrical stimulation performed: No Parameters: N/A Treatment response/outcome: twitch, cramp, elongation STM Rt upper trap PT demo with soft foam roller thoracic extension over roller and thoracic bridge and roll Heat Rt upper quadrant seated x10'  01/23/23: UBE L3 3x2 PT present to discuss status Seated upper trap  stretch with overpressure: 2x30" bil Standing row 10lb 2x10 Lat pulldowns green tband over top of door x20 (PT demo and VQ for wide arm "W") SL Rt shoulder ER 1lb x10 Trigger Point Dry-Needling  Treatment instructions: Expect mild to moderate muscle soreness. S/S of pneumothorax if dry needled over a lung field, and to seek immediate medical attention should they occur. Patient verbalized understanding of these instructions and education.  Patient Consent Given: Yes Education handout provided: Previously provided Muscles treated: Rt upper trap, teres minor, infraspinatus Electrical stimulation performed: No Parameters: N/A Treatment response/outcome: signif twitch and release anterior band upper trap and teres minor STM and heat x 10' to posterior shoulder and upper trap   01/17/23: UBE L3 2x2 PT present to review goals Rt UE full elevation stretching against wall, 4 slides x5" holds Standing Rt shoulder abduction 1lb 1x10 with elbow bent for shorter lever (some pain) SL Rt shoulder ER 1lb 2x10 3-way raises 1lb (added abduction long lever today) 5 rounds - Pt did better with this than short lever Updated HEP   PATIENT EDUCATION: Education details: HEP and DN handout Person educated: Patient Education method: Explanation, Demonstration, Verbal cues, and Handouts Education comprehension: verbalized understanding and returned demonstration   HOME EXERCISE PROGRAM: Access Code: [email or web address] URL: [email or web address] Date: 12/19/2022 Prepared by: Venetia Night Cristal Howatt  Exercises - Seated Upper Trapezius Stretch  - 1 x daily - 7 x weekly  - 1 sets - 2 reps - 20 hold - Seated Levator Scapulae Stretch  - 1 x daily - 7 x weekly - 1 sets - 2 reps - 20 hold - Seated Rhomboid Stretch  - 1 x daily - 7 x weekly - 1 sets - 2 reps - 20 hold - Standing Shoulder Row with Anchored Resistance  - 1 x daily - 7 x weekly - 2 sets - 10 reps - Shoulder extension with resistance - Neutral  - 1 x daily - 7 x weekly - 2 sets - 10 reps - Standing Thoracic Open Book at [place]  - 1 x daily - 7 x weekly - 3 sets - 10 reps - Shoulder External Rotation and Scapular Retraction with Resistance  - 1 x daily - 7 x weekly - 2 sets - 10 reps - Shoulder External Rotation with Anchored Resistance  - 1 x daily - 7 x weekly - 2 sets - 10 reps - Standing Shoulder Horizontal Abduction with Resistance  - 1 x daily - 7 x weekly - 2 sets - 10 reps  HEP as of 01/17/23: Standing lat pulldown 15lb 1x10 Bent over tricep kickbacks 1x10 1lb Bent over fly 1lb bil x 10 Bent over shoulder ext 1lb bil x10 Squat with upright row 5lb bil UE x 10 Squat with bicep curl to overhead press 5lb x 10 3-way raises 1lb to 90 deg (forward, 45 deg, sides) Sidelying external rotation 1lb x10 Bird dogs 5 rounds opp arm/leg  Counter push ups 2x15 Counter plank with knee driver (diagonal knee march) x20 Counter plank with hip extension (leg lift) x10 alt legs   ASSESSMENT:   CLINICAL IMPRESSION: Pt is progressing her weight training ind at home with good tolerance.  She presented today with stiffness in Rt ribcage posteriorly T1-T6 and had small area of TP in Rt upper trap and infraspinatus today.  She will look into getting a soft foam roller to use for lumbar and thoracic stretching and mobility as recommended by PT today.  Progressed lat pulldown to black band today  with cueing needed for ribcage knitting to avoid T/L jxn hinge which Pt was able to correct.  Continue along POC with tapered visits to check in for manual therapy needs and exercise progression guidance. .   OBJECTIVE  IMPAIRMENTS: decreased coordination, decreased strength, increased muscle spasms, impaired flexibility, postural dysfunction, and pain.    ACTIVITY LIMITATIONS: lifting, sleeping, reach over head, and exercise   PARTICIPATION LIMITATIONS: cleaning, laundry, community activity, and exercise   PERSONAL FACTORS: Time since onset of injury/illness/exacerbation are also affecting patient's functional outcome.    REHAB POTENTIAL: Excellent   CLINICAL DECISION MAKING: Stable/uncomplicated   EVALUATION COMPLEXITY: Low     GOALS: Goals reviewed with patient? Yes   SHORT TERM GOALS: Target date: 12/25/22   Pt will be ind with initial HEP Baseline: Goal status: Goal met 12/10/22   2.  Pt will report at least 20% less pain with functional use of Rt UE. Baseline:  Goal status: Goal met 12/10/22   3.  Pt will achieve reduced tightness and trigger point presence by at least 50% along posterior shoulder. Baseline:  Goal status: Goal met 12/10/22   4.  Pt will report improved sleep by at least 20% Baseline:  Goal status: No pain in shoulder but in upper back can still be painful and fingers can get tingly. Partially met      LONG TERM GOALS: Target date: 03/14/23   Pt will be ind with advanced HEP and understand how to avoid exacerbation of symptoms. Baseline:  Goal status: ongoing, progressing   2.  Pt will improve FOTO score to at least 76% to demo improved function. Baseline: 75% Goal status: ongoing   3.  Pt will be able to sleep on Rt side and back without pain Baseline:  Goal status: MET   4.  Pt will report improved Rt shoulder pain by at least 70% with daily activities Baseline:  Goal status: MET   5.  Pt will be able to perform functional reaching and lifting tasks with Rt UE without pain Baseline:  Goal status: ongoing for heavier lifts       PLAN:   PT FREQUENCY: 1x every other week   PT DURATION: 6 weeks   PLANNED INTERVENTIONS: Therapeutic exercises,  Therapeutic activity, Neuromuscular re-education, Patient/Family education, Self Care, Joint mobilization, Dry Needling, Electrical stimulation, Spinal mobilization, Cryotherapy, Moist heat, Taping, Vasopneumatic device, Ionotophoresis '4mg'$ /ml Dexamethasone, and Manual therapy   PLAN FOR NEXT SESSION: f/u on HEP, progress Rt shoulder strengthening, manual therapy as needed, transition to ind with HEP  Eliyah Mcshea, PT 02/07/23 1:25 PM

## 2023-02-26 ENCOUNTER — Ambulatory Visit: Payer: BC Managed Care – PPO | Attending: Family Medicine | Admitting: Physical Therapy

## 2023-02-26 ENCOUNTER — Encounter: Payer: Self-pay | Admitting: Physical Therapy

## 2023-02-26 DIAGNOSIS — G8929 Other chronic pain: Secondary | ICD-10-CM | POA: Diagnosis not present

## 2023-02-26 DIAGNOSIS — M6281 Muscle weakness (generalized): Secondary | ICD-10-CM | POA: Diagnosis not present

## 2023-02-26 DIAGNOSIS — R293 Abnormal posture: Secondary | ICD-10-CM | POA: Insufficient documentation

## 2023-02-26 DIAGNOSIS — M25511 Pain in right shoulder: Secondary | ICD-10-CM | POA: Insufficient documentation

## 2023-02-26 NOTE — Therapy (Signed)
OUTPATIENT PHYSICAL THERAPY TREATMENT NOTE   Patient Name: Sierra Olsen MRN: [id number] DOB:15-Nov-1976, 47 y.o., female Today's Date: 02/26/2023  PCP: Marda Stalker, PA-C  REFERRING PROVIDER: Marda Stalker, PA-C   END Of SESSION:   PT End of Session - 02/26/23 1237     Visit Number 18    Date for PT Re-Evaluation 03/14/23    Authorization Type BCBS    PT Start Time 1233    PT Stop Time 1315    PT Time Calculation (min) 42 min    Activity Tolerance Patient tolerated treatment well    Behavior During Therapy WFL for tasks assessed/performed                            Past Medical History:  Diagnosis Date   Allergy    Anxiety    Anxiety    Asthma    Celiac disease    Celiac disease    DVT (deep venous thrombosis)    Of iliac vein of LLE   DVT (deep venous thrombosis) 01/12/2019   Endometriosis    Endometriosis    Environmental allergies    Epigastric pain    Family history of adverse reaction to anesthesia    SISTER HAD TROUBLE BREATHING   GERD (gastroesophageal reflux disease)    Headache    Hx of migraine headaches    Hx of migraine headaches    Hypoglycemia    Hypoglycemia    Leukocytosis    Low back pain    Nausea    PE (pulmonary thromboembolism) 03/2018   PE (pulmonary thromboembolism)    PVC (premature ventricular contraction)    Retinal edema    Retinal edema    RUQ abdominal pain    Tinnitus    Tinnitus    Past Surgical History:  Procedure Laterality Date   ABDOMINAL HYSTERECTOMY     CORONARY ULTRASOUND/IVUS N/A 01/12/2019   Procedure: INTRAVASCULAR ULTRASOUND/IVUS;  Surgeon: Waynetta Sandy, MD;  Location: [facility];  Service: Cardiovascular;  Laterality: N/A;   DILATION AND CURETTAGE OF UTERUS     LOWER EXTREMITY VENOGRAPHY Left 01/12/2019   Procedure: LOWER EXTREMITY VENOGRAPHY;  Surgeon: Waynetta Sandy, MD;  Location: [facility];  Service: Cardiovascular;  Laterality: Left;    PERIPHERAL VASCULAR INTERVENTION Left 01/12/2019   Procedure: PERIPHERAL VASCULAR INTERVENTION;  Surgeon: Waynetta Sandy, MD;  Location: [facility];  Service: Cardiovascular;  Laterality: Left;  common and external iliac venous Ivus and stent   tubes in ears     age 47   WISDOM TOOTH EXTRACTION     Patient Active Problem List   Diagnosis Date Noted   Palpitations 05/25/2022   DVT (deep venous thrombosis) 01/12/2019   PE (pulmonary thromboembolism) 04/08/2018   Endometriosis 04/07/2018   Chest pain of uncertain etiology [id number]   Asthma, mild intermittent, well-controlled 03/24/2014   Seasonal and perennial allergic rhinitis 03/24/2014    REFERRING DIAG: M25.511 (ICD-10-CM) - Pain in right shoulder   THERAPY DIAG:  Chronic right shoulder pain  Abnormal posture  Muscle weakness (generalized)  Rationale for Evaluation and Treatment Rehabilitation  PERTINENT HISTORY: PMH: Plavix with Hx of DVT/PE, asthma, allergies, LBP   PRECAUTIONS: None   SUBJECTIVE:    SUBJECTIVE STATEMENT:  I got through all my easter services and continue to work out.  I realize how important my stretches are b/c I get tight and sore.  PAIN:  Are you having pain? 0/10 no pain just stiff   OBJECTIVE: (objective measures completed at initial evaluation unless otherwise dated)  DIAGNOSTIC FINDINGS:  none   PATIENT SURVEYS:  01/17/23: FOTO 75%, removed from [place] 01/02/23: FOTO 68%, regressed score but this does not match Pt's subjective reports of improvement FOTO 75% goal 76%   COGNITION: Overall cognitive status: Within functional limits for tasks assessed                                  SENSATION: WFL   POSTURE: Rt scapula abducted from spine, slightly rounded   UPPER EXTREMITY ROM:    Pt has full ROM in all planes bil     UPPER EXTREMITY MMT:   MMT Right eval Left eval RIGHT 2/15 LEFT 2/15  Shoulder flexion 4 5 5    Shoulder extension     5   Shoulder  abduction 4 5 4+   Shoulder adduction     5   Shoulder internal rotation     5   Shoulder external rotation     5   Middle trapezius 4 4 4  4+  Lower trapezius 4- 4 4 4   Elbow flexion     5   Elbow extension     5   Wrist flexion        Wrist extension        Wrist ulnar deviation        Wrist radial deviation        Wrist pronation        Wrist supination        Grip strength (lbs)        (Blank rows = not tested)   SHOULDER SPECIAL TESTS: Impingement tests: Hawkins/Kennedy impingement test: positive  and Painful arc test: negative SLAP lesions: Clunk test: negative Instability tests: Apprehension test: negative Rotator cuff assessment: Empty can test: negative Biceps assessment: Speed's test: negative   JOINT MOBILITY TESTING:  Slight stiffness present for posterior and inferior glides Rt GH joint Limited ribcage springing on Rt T3-T7   PALPATION:  Signif increase in resting tone with TP present: Rt upper trap, levator infraspinatus, teres minor and major, posterior deltoid, lat, rhomboids             TODAY'S TREATMENT:  02/26/23: UBE L3 3x2 PT present to discuss status Seated arms overhead trunk rotation bil for thoracic mobility and stretching Ribcage lateral shifting with rotation bil Vertical along spine soft foam roller decompression, Y/W/T with inhale/exhale Trigger Point Dry-Needling  Treatment instructions: Expect mild to moderate muscle soreness. S/S of pneumothorax if dry needled over a lung field, and to seek immediate medical attention should they occur. Patient verbalized understanding of these instructions and education.  Patient Consent Given: Yes Education handout provided: Previously provided Muscles treated: Rt upper trap Electrical stimulation performed: No Parameters: N/A Treatment response/outcome: signif twitch, relaxation and return to normal resting tone STM Rt upper trap, Rt intrascapular mobs Discussion about discharging planning, ERO next  visit  02/07/23: UBE L3 3x2 PT present to discuss status Lower trunk rotation x10, hip windshield wipers x10 DKTC with foam roller under pelvis for lower lumbar flexion Manual therapy: Right ribcage springing Gr II/III, costovertebral joint PA T1-T3 Gr II/III Trigger Point Dry-Needling  Treatment instructions: Expect mild to moderate muscle soreness. S/S of pneumothorax if dry needled over a lung field, and to seek immediate medical attention should  they occur. Patient verbalized understanding of these instructions and education.  Patient Consent Given: Yes Education handout provided: Previously provided Muscles treated: Rt upper trap, infraspinatus Electrical stimulation performed: No Parameters: N/A Treatment response/outcome: twitch, cramp, elongation STM Rt upper trap PT demo with soft foam roller thoracic extension over roller and thoracic bridge and roll Heat Rt upper quadrant seated x10'  01/23/23: UBE L3 3x2 PT present to discuss status Seated upper trap stretch with overpressure: 2x30" bil Standing row 10lb 2x10 Lat pulldowns green tband over top of door x20 (PT demo and VQ for wide arm "W") SL Rt shoulder ER 1lb x10 Trigger Point Dry-Needling  Treatment instructions: Expect mild to moderate muscle soreness. S/S of pneumothorax if dry needled over a lung field, and to seek immediate medical attention should they occur. Patient verbalized understanding of these instructions and education.  Patient Consent Given: Yes Education handout provided: Previously provided Muscles treated: Rt upper trap, teres minor, infraspinatus Electrical stimulation performed: No Parameters: N/A Treatment response/outcome: signif twitch and release anterior band upper trap and teres minor STM and heat x 10' to posterior shoulder and upper trap  PATIENT EDUCATION: Education details: HEP and DN handout Person educated: Patient Education method: Explanation, Demonstration, Verbal cues, and  Handouts Education comprehension: verbalized understanding and returned demonstration   HOME EXERCISE PROGRAM: Access Code: [email or web address] URL: [email or web address] Date: 12/19/2022 Prepared by: Venetia Night Kylei Purington  Exercises - Seated Upper Trapezius Stretch  - 1 x daily - 7 x weekly - 1 sets - 2 reps - 20 hold - Seated Levator Scapulae Stretch  - 1 x daily - 7 x weekly - 1 sets - 2 reps - 20 hold - Seated Rhomboid Stretch  - 1 x daily - 7 x weekly - 1 sets - 2 reps - 20 hold - Standing Shoulder Row with Anchored Resistance  - 1 x daily - 7 x weekly - 2 sets - 10 reps - Shoulder extension with resistance - Neutral  - 1 x daily - 7 x weekly - 2 sets - 10 reps - Standing Thoracic Open Book at [place]  - 1 x daily - 7 x weekly - 3 sets - 10 reps - Shoulder External Rotation and Scapular Retraction with Resistance  - 1 x daily - 7 x weekly - 2 sets - 10 reps - Shoulder External Rotation with Anchored Resistance  - 1 x daily - 7 x weekly - 2 sets - 10 reps - Standing Shoulder Horizontal Abduction with Resistance  - 1 x daily - 7 x weekly - 2 sets - 10 reps  HEP as of 01/17/23: Standing lat pulldown 15lb 1x10 Bent over tricep kickbacks 1x10 1lb Bent over fly 1lb bil x 10 Bent over shoulder ext 1lb bil x10 Squat with upright row 5lb bil UE x 10 Squat with bicep curl to overhead press 5lb x 10 3-way raises 1lb to 90 deg (forward, 45 deg, sides) Sidelying external rotation 1lb x10 Bird dogs 5 rounds opp arm/leg  Counter push ups 2x15 Counter plank with knee driver (diagonal knee march) x20 Counter plank with hip extension (leg lift) x10 alt legs   ASSESSMENT:   CLINICAL IMPRESSION:  Pt reports compliance with strengthening > stretching although she does stretch prior to workouts.  She had return of Rt upper trap tightness with broad band trigger point present which responded very well to DN.  She has some Rt sided "wrap around" symptom tightness which PT was able to tease out via a  seated  overhead V reach stretch with thoracic rotation.  ERO next visit with plan to d/c to HEP .   OBJECTIVE IMPAIRMENTS: decreased coordination, decreased strength, increased muscle spasms, impaired flexibility, postural dysfunction, and pain.    ACTIVITY LIMITATIONS: lifting, sleeping, reach over head, and exercise   PARTICIPATION LIMITATIONS: cleaning, laundry, community activity, and exercise   PERSONAL FACTORS: Time since onset of injury/illness/exacerbation are also affecting patient's functional outcome.    REHAB POTENTIAL: Excellent   CLINICAL DECISION MAKING: Stable/uncomplicated   EVALUATION COMPLEXITY: Low     GOALS: Goals reviewed with patient? Yes   SHORT TERM GOALS: Target date: 12/25/22   Pt will be ind with initial HEP Baseline: Goal status: Goal met 12/10/22   2.  Pt will report at least 20% less pain with functional use of Rt UE. Baseline:  Goal status: Goal met 12/10/22   3.  Pt will achieve reduced tightness and trigger point presence by at least 50% along posterior shoulder. Baseline:  Goal status: Goal met 12/10/22   4.  Pt will report improved sleep by at least 20% Baseline:  Goal status: No pain in shoulder but in upper back can still be painful and fingers can get tingly. Partially met      LONG TERM GOALS: Target date: 03/14/23   Pt will be ind with advanced HEP and understand how to avoid exacerbation of symptoms. Baseline:  Goal status: ongoing, progressing   2.  Pt will improve FOTO score to at least 76% to demo improved function. Baseline: 75% Goal status: ongoing   3.  Pt will be able to sleep on Rt side and back without pain Baseline:  Goal status: MET   4.  Pt will report improved Rt shoulder pain by at least 70% with daily activities Baseline:  Goal status: MET   5.  Pt will be able to perform functional reaching and lifting tasks with Rt UE without pain Baseline:  Goal status: ongoing for heavier lifts       PLAN:   PT  FREQUENCY: 1x every other week   PT DURATION: 6 weeks   PLANNED INTERVENTIONS: Therapeutic exercises, Therapeutic activity, Neuromuscular re-education, Patient/Family education, Self Care, Joint mobilization, Dry Needling, Electrical stimulation, Spinal mobilization, Cryotherapy, Moist heat, Taping, Vasopneumatic device, Ionotophoresis 4mg /ml Dexamethasone, and Manual therapy   PLAN FOR NEXT SESSION: f/u on HEP, progress Rt shoulder strengthening, manual therapy as needed, transition to ind with HEP  Aleczander Fandino, PT 02/26/23 1:22 PM

## 2023-03-01 DIAGNOSIS — F4010 Social phobia, unspecified: Secondary | ICD-10-CM | POA: Diagnosis not present

## 2023-03-12 ENCOUNTER — Ambulatory Visit: Payer: BC Managed Care – PPO | Admitting: Physical Therapy

## 2023-03-12 ENCOUNTER — Encounter: Payer: Self-pay | Admitting: Physical Therapy

## 2023-03-12 DIAGNOSIS — R293 Abnormal posture: Secondary | ICD-10-CM

## 2023-03-12 DIAGNOSIS — G8929 Other chronic pain: Secondary | ICD-10-CM | POA: Diagnosis not present

## 2023-03-12 DIAGNOSIS — M6281 Muscle weakness (generalized): Secondary | ICD-10-CM | POA: Diagnosis not present

## 2023-03-12 DIAGNOSIS — M25511 Pain in right shoulder: Secondary | ICD-10-CM | POA: Diagnosis not present

## 2023-03-12 NOTE — Therapy (Signed)
OUTPATIENT PHYSICAL THERAPY TREATMENT NOTE   Patient Name: Sierra Olsen MRN: [id number] DOB:10/12/1976, 47 y.o., female Today's Date: 03/12/2023  PCP: Jarrett Soho, PA-C  REFERRING PROVIDER: Jarrett Soho, PA-C   END Of SESSION:   PT End of Session - 03/12/23 0932     Visit Number 19    Date for PT Re-Evaluation 03/14/23    Authorization Type BCBS    PT Start Time 0932    PT Stop Time 1008    PT Time Calculation (min) 36 min    Activity Tolerance Patient tolerated treatment well    Behavior During Therapy WFL for tasks assessed/performed               Past Medical History:  Diagnosis Date   Allergy    Anxiety    Anxiety    Asthma    Celiac disease    Celiac disease    DVT (deep venous thrombosis)    Of iliac vein of LLE   DVT (deep venous thrombosis) 01/12/2019   Endometriosis    Endometriosis    Environmental allergies    Epigastric pain    Family history of adverse reaction to anesthesia    SISTER HAD TROUBLE BREATHING   GERD (gastroesophageal reflux disease)    Headache    Hx of migraine headaches    Hx of migraine headaches    Hypoglycemia    Hypoglycemia    Leukocytosis    Low back pain    Nausea    PE (pulmonary thromboembolism) 03/2018   PE (pulmonary thromboembolism)    PVC (premature ventricular contraction)    Retinal edema    Retinal edema    RUQ abdominal pain    Tinnitus    Tinnitus    Past Surgical History:  Procedure Laterality Date   ABDOMINAL HYSTERECTOMY     CORONARY ULTRASOUND/IVUS N/A 01/12/2019   Procedure: INTRAVASCULAR ULTRASOUND/IVUS;  Surgeon: Maeola Harman, MD;  Location: [facility] INVASIVE CV LAB;  Service: Cardiovascular;  Laterality: N/A;   DILATION AND CURETTAGE OF UTERUS     LOWER EXTREMITY VENOGRAPHY Left 01/12/2019   Procedure: LOWER EXTREMITY VENOGRAPHY;  Surgeon: Maeola Harman, MD;  Location: [facility] INVASIVE CV LAB;  Service: Cardiovascular;  Laterality: Left;   PERIPHERAL VASCULAR  INTERVENTION Left 01/12/2019   Procedure: PERIPHERAL VASCULAR INTERVENTION;  Surgeon: Maeola Harman, MD;  Location: [facility] INVASIVE CV LAB;  Service: Cardiovascular;  Laterality: Left;  common and external iliac venous Ivus and stent   tubes in ears     age 2   WISDOM TOOTH EXTRACTION     Patient Active Problem List   Diagnosis Date Noted   Palpitations 05/25/2022   DVT (deep venous thrombosis) 01/12/2019   PE (pulmonary thromboembolism) 04/08/2018   Endometriosis 04/07/2018   Chest pain of uncertain etiology 04/09/2017   Asthma, mild intermittent, well-controlled 03/24/2014   Seasonal and perennial allergic rhinitis 03/24/2014    REFERRING DIAG: M25.511 (ICD-10-CM) - Pain in right shoulder   THERAPY DIAG:  Chronic right shoulder pain  Muscle weakness (generalized)  Abnormal posture  Rationale for Evaluation and Treatment Rehabilitation  PERTINENT HISTORY: PMH: Plavix with Hx of DVT/PE, asthma, allergies, LBP   PRECAUTIONS: None   SUBJECTIVE:    SUBJECTIVE STATEMENT:  I am ready to graduate from PT.  The stretches are helping a lot.  I like laying on the foam roller from last time and have incorporated that.  I am strengthening 3 days a week, blending exercises from a book/program  and my HEP from here.  I have gone up in weights without increased pain or set backs.     PAIN:  Are you having pain? 2/10 from picking up and throwing hay bales   OBJECTIVE: (objective measures completed at initial evaluation unless otherwise dated)  DIAGNOSTIC FINDINGS:  none   PATIENT SURVEYS:  01/17/23: FOTO 75%, removed from FOTO 01/02/23: FOTO 68%, regressed score but this does not match Pt's subjective reports of improvement FOTO 75% goal 76%   COGNITION: Overall cognitive status: Within functional limits for tasks assessed                                  SENSATION: WFL   POSTURE: 4/16: Improved symmetrical resting position of scapula, good alignment of head on neck,  neck on upper back Rt scapula abducted from spine, slightly rounded   UPPER EXTREMITY ROM:    Pt has full ROM in all planes bil     UPPER EXTREMITY MMT:   MMT Right eval Left eval RIGHT 2/15 LEFT 2/15 RIGHT 4/16 LEFT 4/16  Shoulder flexion 4 5 5  5 5   Shoulder extension     5  5 5   Shoulder abduction 4 5 4+  5 5  Shoulder adduction     5  5 5   Shoulder internal rotation     5  5 5   Shoulder external rotation     5  5 5   Middle trapezius 4 4 4  4+ 5 5  Lower trapezius 4- 4 4 4 5 5   Elbow flexion     5  5 5   Elbow extension     5  5 5   Wrist flexion          Wrist extension          Wrist ulnar deviation          Wrist radial deviation          Wrist pronation          Wrist supination          Grip strength (lbs)          (Blank rows = not tested)   SHOULDER SPECIAL TESTS: 4/16: all special tests negative  Impingement tests: Hawkins/Kennedy impingement test: positive  and Painful arc test: negative SLAP lesions: Clunk test: negative Instability tests: Apprehension test: negative Rotator cuff assessment: Empty can test: negative Biceps assessment: Speed's test: negative   JOINT MOBILITY TESTING:  Slight stiffness present for posterior and inferior glides Rt GH joint Limited ribcage springing on Rt T3-T7   PALPATION:  Signif increase in resting tone with TP present: Rt upper trap, levator infraspinatus, teres minor and major, posterior deltoid, lat, rhomboids             TODAY'S TREATMENT:  03/12/23: UBE L3 2x2 PT present to discuss status Seated stretches: baby eagle, SB with overpressure, levator scap - bil, 30" each Seated trunk rotation arms overhead in "Y" 2x15" bil Seated trunk SB stretch with overhead reach 2x15" Standing L at counter 2x15" Lower trap lift offs from wall - excellent recruitment and symmetry and range Lat pulldowns black band x15, VC for more vertical "W" of arms Bent over reverse fly bil 10lb x10 Review of form: diagonal squat with  snatch to overhead press 10lb, squat to bil overhead press with heel raise Concepts of control, concentric/eccentric phases of therex, work to fatigue  and watch for substitution patterns Pt education on how to keep up mobility and strength when traveling  02/26/23: UBE L3 3x2 PT present to discuss status Seated arms overhead trunk rotation bil for thoracic mobility and stretching Ribcage lateral shifting with rotation bil Vertical along spine soft foam roller decompression, Y/W/T with inhale/exhale Trigger Point Dry-Needling  Treatment instructions: Expect mild to moderate muscle soreness. S/S of pneumothorax if dry needled over a lung field, and to seek immediate medical attention should they occur. Patient verbalized understanding of these instructions and education.  Patient Consent Given: Yes Education handout provided: Previously provided Muscles treated: Rt upper trap Electrical stimulation performed: No Parameters: N/A Treatment response/outcome: signif twitch, relaxation and return to normal resting tone STM Rt upper trap, Rt intrascapular mobs Discussion about discharging planning, ERO next visit  02/07/23: UBE L3 3x2 PT present to discuss status Lower trunk rotation x10, hip windshield wipers x10 DKTC with foam roller under pelvis for lower lumbar flexion Manual therapy: Right ribcage springing Gr II/III, costovertebral joint PA T1-T3 Gr II/III Trigger Point Dry-Needling  Treatment instructions: Expect mild to moderate muscle soreness. S/S of pneumothorax if dry needled over a lung field, and to seek immediate medical attention should they occur. Patient verbalized understanding of these instructions and education.  Patient Consent Given: Yes Education handout provided: Previously provided Muscles treated: Rt upper trap, infraspinatus Electrical stimulation performed: No Parameters: N/A Treatment response/outcome: twitch, cramp, elongation STM Rt upper trap PT demo with  soft foam roller thoracic extension over roller and thoracic bridge and roll Heat Rt upper quadrant seated x10'   PATIENT EDUCATION: Education details: HEP and DN handout Person educated: Patient Education method: Explanation, Demonstration, Verbal cues, and Handouts Education comprehension: verbalized understanding and returned demonstration   HOME EXERCISE PROGRAM: Access Code: [email or web address] URL: [email or web address] Date: 12/19/2022 Prepared by: Loistine Simas Shoshanah Dapper  Exercises - Seated Upper Trapezius Stretch  - 1 x daily - 7 x weekly - 1 sets - 2 reps - 20 hold - Seated Levator Scapulae Stretch  - 1 x daily - 7 x weekly - 1 sets - 2 reps - 20 hold - Seated Rhomboid Stretch  - 1 x daily - 7 x weekly - 1 sets - 2 reps - 20 hold - Standing Shoulder Row with Anchored Resistance  - 1 x daily - 7 x weekly - 2 sets - 10 reps - Shoulder extension with resistance - Neutral  - 1 x daily - 7 x weekly - 2 sets - 10 reps - Standing Thoracic Open Book at Wall  - 1 x daily - 7 x weekly - 3 sets - 10 reps - Shoulder External Rotation and Scapular Retraction with Resistance  - 1 x daily - 7 x weekly - 2 sets - 10 reps - Shoulder External Rotation with Anchored Resistance  - 1 x daily - 7 x weekly - 2 sets - 10 reps - Standing Shoulder Horizontal Abduction with Resistance  - 1 x daily - 7 x weekly - 2 sets - 10 reps  HEP as of 01/17/23: Standing lat pulldown 15lb 1x10 Bent over tricep kickbacks 1x10 1lb Bent over fly 1lb bil x 10 Bent over shoulder ext 1lb bil x10 Squat with upright row 5lb bil UE x 10 Squat with bicep curl to overhead press 5lb x 10 3-way raises 1lb to 90 deg (forward, 45 deg, sides) Sidelying external rotation 1lb x10 Bird dogs 5 rounds opp arm/leg  Counter push ups 2x15 Counter plank with knee driver (  diagonal knee march) x20 Counter plank with hip extension (leg lift) x10 alt legs   ASSESSMENT:   CLINICAL IMPRESSION:  Pt has met all goals and is ind with  symptom management tools and HEP.  She is very motivated and continues to work on strengthening surrounding postural muscles, stabilizers of neck and shoulder complex, and functional strength for UE and trunk.  She has tendency to get trigger points in Rt upper trap and stiffness in Rt upper quadrant ribcage, but has stretches, ROM and trigger point release tools to help with this. She should do well with d/c today to HEP with return to clinic with new Rx for PT as needed for any unmanageable return of symptoms.   .   OBJECTIVE IMPAIRMENTS: decreased coordination, decreased strength, increased muscle spasms, impaired flexibility, postural dysfunction, and pain.    ACTIVITY LIMITATIONS: lifting, sleeping, reach over head, and exercise   PARTICIPATION LIMITATIONS: cleaning, laundry, community activity, and exercise   PERSONAL FACTORS: Time since onset of injury/illness/exacerbation are also affecting patient's functional outcome.    REHAB POTENTIAL: Excellent   CLINICAL DECISION MAKING: Stable/uncomplicated   EVALUATION COMPLEXITY: Low     GOALS: Goals reviewed with patient? Yes   SHORT TERM GOALS: Target date: 12/25/22   Pt will be ind with initial HEP Baseline: Goal status: Goal met 12/10/22   2.  Pt will report at least 20% less pain with functional use of Rt UE. Baseline:  Goal status: Goal met 12/10/22   3.  Pt will achieve reduced tightness and trigger point presence by at least 50% along posterior shoulder. Baseline:  Goal status: Goal met 12/10/22   4.  Pt will report improved sleep by at least 20% Baseline:  Goal status: No pain in shoulder but in upper back can still be painful and fingers can get tingly. Partially met      LONG TERM GOALS: Target date: 03/14/23   Pt will be ind with advanced HEP and understand how to avoid exacerbation of symptoms. Baseline:  Goal status: MET 4/16   2.  Pt will improve FOTO score to at least 76% to demo improved function. Baseline:  75% Goal status: met 2/22   3.  Pt will be able to sleep on Rt side and back without pain Baseline:  Goal status: MET   4.  Pt will report improved Rt shoulder pain by at least 70% with daily activities Baseline:  Goal status: MET   5.  Pt will be able to perform functional reaching and lifting tasks with Rt UE without pain Baseline:  Goal status: MET 4/16       PLAN:   PT FREQUENCY: 1x every other week   PT DURATION: 6 weeks   PLANNED INTERVENTIONS: Therapeutic exercises, Therapeutic activity, Neuromuscular re-education, Patient/Family education, Self Care, Joint mobilization, Dry Needling, Electrical stimulation, Spinal mobilization, Cryotherapy, Moist heat, Taping, Vasopneumatic device, Ionotophoresis /ml Dexamethasone, and Manual therapy   PLAN FOR NEXT SESSION: d/c to HEP  PHYSICAL THERAPY DISCHARGE SUMMARY  Visits from Start of Care: 19  Current functional level related to goals / functional outcomes: Pt met all goals and is ind with symptom management and HEP.   Remaining deficits: See above   Education / Equipment: HEP   Patient agrees to discharge. Patient goals were met. Patient is being discharged due to meeting the stated rehab goals.   Hailey Miles, PT 03/12/23 10:09 AM

## 2023-03-15 DIAGNOSIS — F4010 Social phobia, unspecified: Secondary | ICD-10-CM | POA: Diagnosis not present

## 2023-03-29 DIAGNOSIS — F4010 Social phobia, unspecified: Secondary | ICD-10-CM | POA: Diagnosis not present

## 2023-04-05 DIAGNOSIS — J342 Deviated nasal septum: Secondary | ICD-10-CM | POA: Diagnosis not present

## 2023-04-05 DIAGNOSIS — J343 Hypertrophy of nasal turbinates: Secondary | ICD-10-CM | POA: Diagnosis not present

## 2023-04-05 DIAGNOSIS — J310 Chronic rhinitis: Secondary | ICD-10-CM | POA: Diagnosis not present

## 2023-04-19 DIAGNOSIS — F4010 Social phobia, unspecified: Secondary | ICD-10-CM | POA: Diagnosis not present

## 2023-05-02 DIAGNOSIS — F4010 Social phobia, unspecified: Secondary | ICD-10-CM | POA: Diagnosis not present

## 2023-06-05 ENCOUNTER — Encounter: Payer: Self-pay | Admitting: Internal Medicine

## 2023-06-06 DIAGNOSIS — F4010 Social phobia, unspecified: Secondary | ICD-10-CM | POA: Diagnosis not present

## 2023-06-07 ENCOUNTER — Other Ambulatory Visit (HOSPITAL_COMMUNITY): Payer: Self-pay

## 2023-06-07 NOTE — Telephone Encounter (Signed)
Per test claims generic Symbicort is covered with an approval through 08/21/2023. PA not needed.

## 2023-06-13 DIAGNOSIS — F419 Anxiety disorder, unspecified: Secondary | ICD-10-CM | POA: Diagnosis not present

## 2023-06-13 DIAGNOSIS — F4010 Social phobia, unspecified: Secondary | ICD-10-CM | POA: Diagnosis not present

## 2023-06-13 NOTE — Telephone Encounter (Signed)
Ok to send generic Symbicort 160, # 1, ref x 12,  inhale 2 puffs then rinse mouth, twice daily Thanks

## 2023-06-13 NOTE — Telephone Encounter (Signed)
O

## 2023-06-18 NOTE — Telephone Encounter (Signed)
Please call this script in. PT req this on 7/17 and [place] noon. [facility] [address]. TY

## 2023-06-19 MED ORDER — BUDESONIDE-FORMOTEROL FUMARATE 160-4.5 MCG/ACT IN AERO: INHALATION_SPRAY | RESPIRATORY_TRACT | 11 refills | Status: DC

## 2023-07-08 DIAGNOSIS — F4010 Social phobia, unspecified: Secondary | ICD-10-CM | POA: Diagnosis not present

## 2023-07-22 DIAGNOSIS — F4010 Social phobia, unspecified: Secondary | ICD-10-CM | POA: Diagnosis not present

## 2023-08-02 DIAGNOSIS — F4010 Social phobia, unspecified: Secondary | ICD-10-CM | POA: Diagnosis not present

## 2023-08-07 ENCOUNTER — Other Ambulatory Visit (HOSPITAL_COMMUNITY): Payer: Self-pay

## 2023-08-16 DIAGNOSIS — F4010 Social phobia, unspecified: Secondary | ICD-10-CM | POA: Diagnosis not present

## 2023-09-06 DIAGNOSIS — M7661 Achilles tendinitis, right leg: Secondary | ICD-10-CM | POA: Diagnosis not present

## 2023-09-10 ENCOUNTER — Other Ambulatory Visit: Payer: Self-pay

## 2023-09-10 ENCOUNTER — Ambulatory Visit: Payer: BC Managed Care – PPO | Attending: Family Medicine | Admitting: Physical Therapy

## 2023-09-10 ENCOUNTER — Encounter: Payer: Self-pay | Admitting: Physical Therapy

## 2023-09-10 DIAGNOSIS — M25571 Pain in right ankle and joints of right foot: Secondary | ICD-10-CM | POA: Diagnosis not present

## 2023-09-10 DIAGNOSIS — M7661 Achilles tendinitis, right leg: Secondary | ICD-10-CM | POA: Insufficient documentation

## 2023-09-10 DIAGNOSIS — M6281 Muscle weakness (generalized): Secondary | ICD-10-CM | POA: Insufficient documentation

## 2023-09-10 NOTE — Therapy (Signed)
OUTPATIENT PHYSICAL THERAPY LOWER EXTREMITY EVALUATION   Patient Name: [name] MRN: [id number] DOB:03/19/1976, 47 y.o., female Today's Date: 09/10/2023  END OF SESSION:  PT End of Session - 09/10/23 0840     Visit Number 1    Date for PT Re-Evaluation 12/03/23    Authorization Type BCBS 60 VL--used 19 this year    PT Start Time 0843    PT Stop Time 0925    PT Time Calculation (min) 42 min    Activity Tolerance Patient tolerated treatment well             Past Medical History:  Diagnosis Date   Allergy    Anxiety    Anxiety    Asthma    Celiac disease    Celiac disease    DVT (deep venous thrombosis) (HCC)    Of iliac vein of LLE   DVT (deep venous thrombosis) (HCC) 01/12/2019   Endometriosis    Endometriosis    Environmental allergies    Epigastric pain    Family history of adverse reaction to anesthesia    SISTER HAD TROUBLE BREATHING   GERD (gastroesophageal reflux disease)    Headache    Hx of migraine headaches    Hx of migraine headaches    Hypoglycemia    Hypoglycemia    Leukocytosis    Low back pain    Nausea    PE (pulmonary thromboembolism) (HCC) 03/2018   PE (pulmonary thromboembolism) (HCC)    PVC (premature ventricular contraction)    Retinal edema    Retinal edema    RUQ abdominal pain    Tinnitus    Tinnitus    Past Surgical History:  Procedure Laterality Date   ABDOMINAL HYSTERECTOMY     CORONARY ULTRASOUND/IVUS N/A 01/12/2019   Procedure: INTRAVASCULAR ULTRASOUND/IVUS;  Surgeon: Maeola Harman, MD;  Location: [facility] INVASIVE CV LAB;  Service: Cardiovascular;  Laterality: N/A;   DILATION AND CURETTAGE OF UTERUS     LOWER EXTREMITY VENOGRAPHY Left 01/12/2019   Procedure: LOWER EXTREMITY VENOGRAPHY;  Surgeon: Maeola Harman, MD;  Location: [facility] INVASIVE CV LAB;  Service: Cardiovascular;  Laterality: Left;   PERIPHERAL VASCULAR INTERVENTION Left 01/12/2019   Procedure: PERIPHERAL VASCULAR INTERVENTION;  Surgeon:  Maeola Harman, MD;  Location: [facility] INVASIVE CV LAB;  Service: Cardiovascular;  Laterality: Left;  common and external iliac venous Ivus and stent   tubes in ears     age 7   WISDOM TOOTH EXTRACTION     Patient Active Problem List   Diagnosis Date Noted   Palpitations 05/25/2022   DVT (deep venous thrombosis) (HCC) 01/12/2019   PE (pulmonary thromboembolism) (HCC) 04/08/2018   Endometriosis 04/07/2018   Chest pain of uncertain etiology 04/09/2017   Asthma, mild intermittent, well-controlled 03/24/2014   Seasonal and perennial allergic rhinitis 03/24/2014    PCP: Jarrett Soho PA-C  REFERRING PROVIDER: Jarrett Soho PA-C   REFERRING DIAG: N82.95 right achilles tendinitis  THERAPY DIAG:  Right ankle pain; weakness Rationale for Evaluation and Treatment: Rehabilitation  ONSET DATE: 08/27/2023  SUBJECTIVE:   SUBJECTIVE STATEMENT: I've had issues in this heel for 18 months to 2 years.  Mostly lateral heel pain.  I was stretching it, lifting weights though and the pain went away mostly.   I went running 2 Fridays ago, I hadn't run in a week.  Finished 3 miles which produced Achilles tendon pain at insertion site.  Wore a boot for 2 days.  Plans to return to lifting  tonight.  Hasn't returned to running.    PERTINENT HISTORY: Does Olympic weight lifting I ruptured an ankle tendon 2 years ago and wore a boot.   Sprained right ankle 2 big ones, I used to run with [employer] (teenager) PMH: Plavix with Hx of DVT/PE 5 years ago, asthma, allergies, LBP  PAIN:   Are you having pain? Yes NPRS scale: 0/10 at the moment Pain location: tender and stiff lateral and medial heel Pain orientation: Right  PAIN TYPE: tight Pain description: intermittent  Aggravating factors: wearing flats (wearing HOKAs today running shoes 47 year old);  mornings; running; up and down stairs Relieving factors: at rest   PRECAUTIONS: None    WEIGHT BEARING RESTRICTIONS: No  FALLS:  Has  patient fallen in last 6 months? No  LIVING ENVIRONMENT: Lives with: lives with their family Lives in: House/apartment Stairs: been doing one at a time at home OCCUPATION: some days on feet a lot  PLOF: Independent  PATIENT GOALS: stronger ankle; a sense of control over tendons; no pain or inflammation  NEXT MD VISIT: as needed  OBJECTIVE:  Note: Objective measures were completed at Evaluation unless otherwise noted.  DIAGNOSTIC FINDINGS: none  PATIENT SURVEYS:  LEFS 57/80  COGNITION: Overall cognitive status: Within functional limits for tasks assessed      EDEMA:  No edema today but pt reports achilles swelling initially  MUSCLE LENGTH:  decreased gastroc soleus muscle lengths; calcaneal hypomobility especially eversion   POSTURE:right WNLs;  left mild pes planus; pt able to actively contract intrinsics for better arch/alignment  PALPATION: Right tender points in gastroc and soleus  LOWER EXTREMITY ROM: full ankle ROM symmetrical with right  LOWER EXTREMITY MMT:  right PF 4-/5, right DF 5/5, inversion and eversion 4+/5, foot intrinsics 4+/5;  left grossly 5/5   FUNCTIONAL TESTS:  Difficulty stabilizing on right with SLS 10 sec; left 15 sec Pain with double heel raise 5x Able to do 1x right single leg heel raise but painful;  left single leg heel raise 5x  GAIT: Comments: WNLs in HOKA shoes   TODAY'S TREATMENT:                                                                                                                              DATE: 10/15    PATIENT EDUCATION:  Education details: Educated patient on anatomy and physiology of current symptoms, prognosis, plan of care as well as initial self care strategies to promote recovery;  PEACE and LOVE acronyms per pt instructions; discussed checking mileage using running app on current running shoes (time to replace?); discussed orthotic options Person educated: Patient Education method: Explanation Education  comprehension: verbalized understanding  HOME EXERCISE PROGRAM: Access Code: [email or web address] URL: [email or web address] Date: 09/10/2023 Prepared by: Lavinia Sharps  Exercises - Long Sitting Ankle Plantar Flexion with Resistance (Mirrored)  - 1 x daily - 7 x weekly - 5 sets - 1 reps - 45 hold - Soleus Heel  Raise in Stride Stance Position  - 1 x daily - 7 x weekly - 5 sets - 1 reps - 45 hold - Single Leg Isometric Heel Raise at Wall (Mirrored)  - 1 x daily - 7 x weekly - 5 sets - 1 reps - 45 hold - Gastroc Stretch on Wall  - 1 x daily - 7 x weekly - 1 sets - 3 reps - 20-30 hold - Seated Gastroc Stretch with Strap  - 1 x daily - 7 x weekly - 1 sets - 3 reps - 20-30 hold  ASSESSMENT:  CLINICAL IMPRESSION: Patient is a 47 y.o. female who was seen today for physical therapy evaluation and treatment for right achilles tendinitis.  She has a prior history of ankle sprains as a teenager, an ankle tendon injury 2 years ago and some medial and lateral heel pain which was well managed until 1 1/2 weeks ago when she developed achilles tendon pain and swelling after running 3 miles.  She used a boot for 2 days and has not tried to run since then.  She reports pain with wearing a flat shoe, tightness in the mornings and has difficulty ascending/descending steps reciprocally.  Pain and weakness with single and double leg heel raises noted.  Decreased gastroc and soleus muscle lengths as well as tender points identified.  LEFS functional outcome scale indicates a moderate impairment.  She would benefit from PT to address these impairments.   OBJECTIVE IMPAIRMENTS: decreased activity tolerance, difficulty walking, decreased strength, impaired perceived functional ability, and pain.   ACTIVITY LIMITATIONS: locomotion level  PARTICIPATION LIMITATIONS: community activity and running and weight lifting  PERSONAL FACTORS: 1 comorbidity: prior history of ankle injury  are also affecting patient's functional  outcome.   REHAB POTENTIAL: Good  CLINICAL DECISION MAKING: Stable/uncomplicated  EVALUATION COMPLEXITY: Low   GOALS: Goals reviewed with patient? Yes  SHORT TERM GOALS: Target date: 10/22/2023   The patient will demonstrate knowledge of basic self care strategies and exercises to promote healing  Baseline: Goal status: INITIAL  2.  The patient will be able to walk a mile with min difficulty Baseline:  Goal status: INITIAL  3.  The patient will be able to do Olympic weightlifting with modifications as needed Baseline:  Goal status: INITIAL  4.  The patient will have improved strength for 20x double leg heel raises with pain 3/10 Baseline:  Goal status: INITIAL     LONG TERM GOALS: Target date: 12/03/2023      The patient will be independent in a safe self progression of a home exercise program to promote further recovery of function  Baseline:  Goal status: INITIAL  2.  The patient will report a 75% improvement in pain levels with functional activities which are currently difficult including walking, running, ascending and descending stairs Baseline:  Goal status: INITIAL  3.  The patient will have good strength in gastroc/soleus muscles for single leg impact activities like running and hopping Baseline:  Goal status: INITIAL  4.  The patient will be able to return to a running program (intervals if needed) Baseline:  Goal status: INITIAL  5.  LEFS improved to 67/80 indicating decreased pain and increased function Baseline:  Goal status: INITIAL     PLAN:  PT FREQUENCY: 1x/week  PT DURATION: 12 weeks  PLANNED INTERVENTIONS: 97164- PT Re-evaluation, 97110-Therapeutic exercises, 97530- Therapeutic activity, [id number]- Neuromuscular re-education, 97535- Self Care, 78469- Manual therapy, [id number]- Aquatic Therapy, 97014- Electrical stimulation (unattended), [id number]- Electrical stimulation (manual), [id number]-  Vasopneumatic device, [id number]- Ultrasound, [id number]-  Ionotophoresis 4mg /ml Dexamethasone, Patient/Family education, Taping, Dry Needling, Joint mobilization, Cryotherapy, and Moist heat  PLAN FOR NEXT SESSION: DN right gastroc/solues;  gastroc and soleus lengthening; gradual progressive loading of achilles (isometrics started today)  Lavinia Sharps, PT 09/10/23 9:48 AM Phone: [phone] Fax: [phone]

## 2023-09-10 NOTE — Patient Instructions (Signed)
We used to use the acronym R.I.C.E to treat inflammatory injuries: Rest Ice Compression Elevation  Now we use P.E.A.C.E. and L.O.V.E to best manage soft tissue injuries:  P:protect avoid things that increase pain the first few days after injury  E:elevation get the injured limb above the heart as often as possible  A:avoid anti-inflammatory meds they reduce tissue healing  C:  compression use an elastic bandage or tape to reduce swelling  E: education you don't need passive treatments or unnecessary medical investigation. Your body knows how to heal  and  L: load let pain guide your return to normal activities  O:optimism condition your brain for optimal recovery by being confident and positive  V: vascularization pain free cardio activities increase blood flow to repairing tissues  E:exercise restore mobility, strength and proprioception by adopting an active approach to recovery

## 2023-09-13 DIAGNOSIS — F4010 Social phobia, unspecified: Secondary | ICD-10-CM | POA: Diagnosis not present

## 2023-09-20 ENCOUNTER — Ambulatory Visit: Payer: BC Managed Care – PPO

## 2023-09-20 DIAGNOSIS — M25571 Pain in right ankle and joints of right foot: Secondary | ICD-10-CM

## 2023-09-20 DIAGNOSIS — R252 Cramp and spasm: Secondary | ICD-10-CM

## 2023-09-20 DIAGNOSIS — M6281 Muscle weakness (generalized): Secondary | ICD-10-CM

## 2023-09-20 DIAGNOSIS — R262 Difficulty in walking, not elsewhere classified: Secondary | ICD-10-CM

## 2023-09-20 DIAGNOSIS — M7661 Achilles tendinitis, right leg: Secondary | ICD-10-CM | POA: Diagnosis not present

## 2023-09-20 DIAGNOSIS — F4010 Social phobia, unspecified: Secondary | ICD-10-CM | POA: Diagnosis not present

## 2023-09-20 NOTE — Therapy (Signed)
OUTPATIENT PHYSICAL THERAPY LOWER EXTREMITY EVALUATION   Patient Name: Sierra Olsen MRN: [id number] DOB:17-Feb-1976, 47 y.o., female Today's Date: 09/20/2023  END OF SESSION:  PT End of Session - 09/20/23 0939     Visit Number 2    Date for PT Re-Evaluation 12/03/23    Authorization Type BCBS 60 VL--used 19 this year    PT Start Time 0930    PT Stop Time 1015    PT Time Calculation (min) 45 min    Activity Tolerance Patient tolerated treatment well    Behavior During Therapy WFL for tasks assessed/performed             Past Medical History:  Diagnosis Date   Allergy    Anxiety    Anxiety    Asthma    Celiac disease    Celiac disease    DVT (deep venous thrombosis) (HCC)    Of iliac vein of LLE   DVT (deep venous thrombosis) (HCC) 01/12/2019   Endometriosis    Endometriosis    Environmental allergies    Epigastric pain    Family history of adverse reaction to anesthesia    SISTER HAD TROUBLE BREATHING   GERD (gastroesophageal reflux disease)    Headache    Hx of migraine headaches    Hx of migraine headaches    Hypoglycemia    Hypoglycemia    Leukocytosis    Low back pain    Nausea    PE (pulmonary thromboembolism) (HCC) 03/2018   PE (pulmonary thromboembolism) (HCC)    PVC (premature ventricular contraction)    Retinal edema    Retinal edema    RUQ abdominal pain    Tinnitus    Tinnitus    Past Surgical History:  Procedure Laterality Date   ABDOMINAL HYSTERECTOMY     CORONARY ULTRASOUND/IVUS N/A 01/12/2019   Procedure: INTRAVASCULAR ULTRASOUND/IVUS;  Surgeon: Maeola Harman, MD;  Location: [facility] INVASIVE CV LAB;  Service: Cardiovascular;  Laterality: N/A;   DILATION AND CURETTAGE OF UTERUS     LOWER EXTREMITY VENOGRAPHY Left 01/12/2019   Procedure: LOWER EXTREMITY VENOGRAPHY;  Surgeon: Maeola Harman, MD;  Location: [facility] INVASIVE CV LAB;  Service: Cardiovascular;  Laterality: Left;   PERIPHERAL VASCULAR INTERVENTION Left  01/12/2019   Procedure: PERIPHERAL VASCULAR INTERVENTION;  Surgeon: Maeola Harman, MD;  Location: [facility] INVASIVE CV LAB;  Service: Cardiovascular;  Laterality: Left;  common and external iliac venous Ivus and stent   tubes in ears     age 28   WISDOM TOOTH EXTRACTION     Patient Active Problem List   Diagnosis Date Noted   Palpitations 05/25/2022   DVT (deep venous thrombosis) (HCC) 01/12/2019   PE (pulmonary thromboembolism) (HCC) 04/08/2018   Endometriosis 04/07/2018   Chest pain of uncertain etiology 04/09/2017   Asthma, mild intermittent, well-controlled 03/24/2014   Seasonal and perennial allergic rhinitis 03/24/2014    PCP: Jarrett Soho PA-C  REFERRING PROVIDER: Jarrett Soho PA-C   REFERRING DIAG: Y30.16 right achilles tendinitis  THERAPY DIAG:  Right ankle pain; weakness Rationale for Evaluation and Treatment: Rehabilitation  ONSET DATE: 08/27/2023  SUBJECTIVE:   SUBJECTIVE STATEMENT: Patient reports she is doing ok today.  Her pain is minimal today as she has avoided running.   She was able to workout since she was here last and did fine.     PERTINENT HISTORY: Does Olympic weight lifting I ruptured an ankle tendon 2 years ago and wore a boot.   Sprained right ankle  2 big ones, I used to run with [employer] (teenager) PMH: Plavix with Hx of DVT/PE 5 years ago, asthma, allergies, LBP  PAIN:   Are you having pain? Yes NPRS scale: 0/10 at the moment Pain location: tender and stiff lateral and medial heel Pain orientation: Right  PAIN TYPE: tight Pain description: intermittent  Aggravating factors: wearing flats (wearing HOKAs today running shoes 46 year old);  mornings; running; up and down stairs Relieving factors: at rest   PRECAUTIONS: None    WEIGHT BEARING RESTRICTIONS: No  FALLS:  Has patient fallen in last 6 months? No  LIVING ENVIRONMENT: Lives with: lives with their family Lives in: House/apartment Stairs: been doing one at a  time at home OCCUPATION: some days on feet a lot  PLOF: Independent  PATIENT GOALS: stronger ankle; a sense of control over tendons; no pain or inflammation  NEXT MD VISIT: as needed  OBJECTIVE:  Note: Objective measures were completed at Evaluation unless otherwise noted.  DIAGNOSTIC FINDINGS: none  PATIENT SURVEYS:  LEFS 57/80  COGNITION: Overall cognitive status: Within functional limits for tasks assessed      EDEMA:  No edema today but pt reports achilles swelling initially  MUSCLE LENGTH:  decreased gastroc soleus muscle lengths; calcaneal hypomobility especially eversion   POSTURE:right WNLs;  left mild pes planus; pt able to actively contract intrinsics for better arch/alignment  PALPATION: Right tender points in gastroc and soleus  LOWER EXTREMITY ROM: full ankle ROM symmetrical with right  LOWER EXTREMITY MMT:  right PF 4-/5, right DF 5/5, inversion and eversion 4+/5, foot intrinsics 4+/5;  left grossly 5/5   FUNCTIONAL TESTS:  Difficulty stabilizing on right with SLS 10 sec; left 15 sec Pain with double heel raise 5x Able to do 1x right single leg heel raise but painful;  left single leg heel raise 5x  GAIT: Comments: WNLs in HOKA shoes   TODAY'S TREATMENT:                                                                                                                              DATE: 10/25 Nustep x 5 min level 5 (PT present to discuss status) Short foot  2 x 10 Inv/Ev towel push x 10 each side Toe scrunch 2 x 10 Marble pick up (whole cup) Cone touches 3 x 10 (cone on floor) Gastroc stretch on rocker board 10 x 10 sec Soleus stretch on rocker board (single leg) 5 x 10 sec Trigger Point Dry-Needling  Treatment instructions: Expect mild to moderate muscle soreness. S/S of pneumothorax if dry needled over a lung field, and to seek immediate medical attention should they occur. Patient verbalized understanding of these instructions and  education. Patient Consent Given: Yes Education handout provided: Yes Muscles treated: gastroc and soleus right  Electrical stimulation performed: No Parameters: N/A Treatment response/outcome: Skilled palpation used to identify taut bands and trigger points.  Once identified, dry needling techniques used to treat these  areas.  Twitch response ellicited along with palpable elongation of muscle.  Following treatment, patient reported "it feels like putty".    DATE: 10/15  Initial eval   PATIENT EDUCATION:  Education details: Educated patient on anatomy and physiology of current symptoms, prognosis, plan of care as well as initial self care strategies to promote recovery;  PEACE and LOVE acronyms per pt instructions; discussed checking mileage using running app on current running shoes (time to replace?); discussed orthotic options Person educated: Patient Education method: Explanation Education comprehension: verbalized understanding  HOME EXERCISE PROGRAM: Access Code: [email or web address] URL: [email or web address] Date: 09/10/2023 Prepared by: Lavinia Sharps  Exercises - Long Sitting Ankle Plantar Flexion with Resistance (Mirrored)  - 1 x daily - 7 x weekly - 5 sets - 1 reps - 45 hold - Soleus Heel Raise in Stride Stance Position  - 1 x daily - 7 x weekly - 5 sets - 1 reps - 45 hold - Single Leg Isometric Heel Raise at Wall (Mirrored)  - 1 x daily - 7 x weekly - 5 sets - 1 reps - 45 hold - Gastroc Stretch on Wall  - 1 x daily - 7 x weekly - 1 sets - 3 reps - 20-30 hold - Seated Gastroc Stretch with Strap  - 1 x daily - 7 x weekly - 1 sets - 3 reps - 20-30 hold  ASSESSMENT:  CLINICAL IMPRESSION: Patient is a 47 y.o. female who was seen today for physical therapy evaluation and treatment for right achilles tendinitis.  She has a prior history of ankle sprains as a teenager, an ankle tendon injury 2 years ago and some medial and lateral heel pain which was well managed until 1 1/2  weeks ago when she developed achilles tendon pain and swelling after running 3 miles.  She used a boot for 2 days and has not tried to run since then.  She reports pain with wearing a flat shoe, tightness in the mornings and has difficulty ascending/descending steps reciprocally.  Pain and weakness with single and double leg heel raises noted.  Decreased gastroc and soleus muscle lengths as well as tender points identified.  LEFS functional outcome scale indicates a moderate impairment.  She would benefit from PT to address these impairments.   OBJECTIVE IMPAIRMENTS: decreased activity tolerance, difficulty walking, decreased strength, impaired perceived functional ability, and pain.   ACTIVITY LIMITATIONS: locomotion level  PARTICIPATION LIMITATIONS: community activity and running and weight lifting  PERSONAL FACTORS: 1 comorbidity: prior history of ankle injury  are also affecting patient's functional outcome.   REHAB POTENTIAL: Good  CLINICAL DECISION MAKING: Stable/uncomplicated  EVALUATION COMPLEXITY: Low   GOALS: Goals reviewed with patient? Yes  SHORT TERM GOALS: Target date: 10/22/2023   The patient will demonstrate knowledge of basic self care strategies and exercises to promote healing  Baseline: Goal status: INITIAL  2.  The patient will be able to walk a mile with min difficulty Baseline:  Goal status: INITIAL  3.  The patient will be able to do Olympic weightlifting with modifications as needed Baseline:  Goal status: INITIAL  4.  The patient will have improved strength for 20x double leg heel raises with pain 3/10 Baseline:  Goal status: INITIAL     LONG TERM GOALS: Target date: 12/03/2023      The patient will be independent in a safe self progression of a home exercise program to promote further recovery of function  Baseline:  Goal status: INITIAL  2.  The patient  will report a 75% improvement in pain levels with functional activities which are  currently difficult including walking, running, ascending and descending stairs Baseline:  Goal status: INITIAL  3.  The patient will have good strength in gastroc/soleus muscles for single leg impact activities like running and hopping Baseline:  Goal status: INITIAL  4.  The patient will be able to return to a running program (intervals if needed) Baseline:  Goal status: INITIAL  5.  LEFS improved to 67/80 indicating decreased pain and increased function Baseline:  Goal status: INITIAL     PLAN:  PT FREQUENCY: 1x/week  PT DURATION: 12 weeks  PLANNED INTERVENTIONS: 97164- PT Re-evaluation, 97110-Therapeutic exercises, 97530- Therapeutic activity, [id number]- Neuromuscular re-education, 97535- Self Care, 16109- Manual therapy, [id number]- Aquatic Therapy, 97014- Electrical stimulation (unattended), [id number]- Electrical stimulation (manual), [id number]- Vasopneumatic device, [id number]- Ultrasound, [id number]- Ionotophoresis 4mg /ml Dexamethasone, Patient/Family education, Taping, Dry Needling, Joint mobilization, Cryotherapy, and Moist heat  PLAN FOR NEXT SESSION: Assess response to DN #1 to right gastroc/solues;  gastroc and soleus lengthening; gradual progressive loading of achilles (isometrics started today)  Victorino Dike B. Onetta Spainhower, PT 09/20/23 11:12 AM [facility] [address] [place], [place] [address] Phone # [phone] Fax [phone]

## 2023-09-27 ENCOUNTER — Ambulatory Visit: Payer: BC Managed Care – PPO | Attending: Family Medicine

## 2023-09-27 DIAGNOSIS — M25571 Pain in right ankle and joints of right foot: Secondary | ICD-10-CM | POA: Diagnosis not present

## 2023-09-27 DIAGNOSIS — M6281 Muscle weakness (generalized): Secondary | ICD-10-CM | POA: Insufficient documentation

## 2023-09-27 DIAGNOSIS — R262 Difficulty in walking, not elsewhere classified: Secondary | ICD-10-CM | POA: Diagnosis not present

## 2023-09-27 DIAGNOSIS — R252 Cramp and spasm: Secondary | ICD-10-CM | POA: Insufficient documentation

## 2023-09-27 NOTE — Therapy (Signed)
OUTPATIENT PHYSICAL THERAPY LOWER EXTREMITY TREATMENT   Patient Name: Sierra Olsen MRN: [id number] DOB:04-24-1976, 47 y.o., female Today's Date: 09/27/2023  END OF SESSION:  PT End of Session - 09/27/23 0931     Visit Number 3    Date for PT Re-Evaluation 12/03/23    Authorization Type BCBS 60 VL--used 19 this year    PT Start Time 0931    PT Stop Time 1015    PT Time Calculation (min) 44 min    Activity Tolerance Patient tolerated treatment well    Behavior During Therapy WFL for tasks assessed/performed             Past Medical History:  Diagnosis Date   Allergy    Anxiety    Anxiety    Asthma    Celiac disease    Celiac disease    DVT (deep venous thrombosis) (HCC)    Of iliac vein of LLE   DVT (deep venous thrombosis) (HCC) 01/12/2019   Endometriosis    Endometriosis    Environmental allergies    Epigastric pain    Family history of adverse reaction to anesthesia    SISTER HAD TROUBLE BREATHING   GERD (gastroesophageal reflux disease)    Headache    Hx of migraine headaches    Hx of migraine headaches    Hypoglycemia    Hypoglycemia    Leukocytosis    Low back pain    Nausea    PE (pulmonary thromboembolism) (HCC) 03/2018   PE (pulmonary thromboembolism) (HCC)    PVC (premature ventricular contraction)    Retinal edema    Retinal edema    RUQ abdominal pain    Tinnitus    Tinnitus    Past Surgical History:  Procedure Laterality Date   ABDOMINAL HYSTERECTOMY     CORONARY ULTRASOUND/IVUS N/A 01/12/2019   Procedure: INTRAVASCULAR ULTRASOUND/IVUS;  Surgeon: Maeola Harman, MD;  Location: [facility] INVASIVE CV LAB;  Service: Cardiovascular;  Laterality: N/A;   DILATION AND CURETTAGE OF UTERUS     LOWER EXTREMITY VENOGRAPHY Left 01/12/2019   Procedure: LOWER EXTREMITY VENOGRAPHY;  Surgeon: Maeola Harman, MD;  Location: [facility] INVASIVE CV LAB;  Service: Cardiovascular;  Laterality: Left;   PERIPHERAL VASCULAR INTERVENTION Left 01/12/2019    Procedure: PERIPHERAL VASCULAR INTERVENTION;  Surgeon: Maeola Harman, MD;  Location: [facility] INVASIVE CV LAB;  Service: Cardiovascular;  Laterality: Left;  common and external iliac venous Ivus and stent   tubes in ears     age 30   WISDOM TOOTH EXTRACTION     Patient Active Problem List   Diagnosis Date Noted   Palpitations 05/25/2022   DVT (deep venous thrombosis) (HCC) 01/12/2019   PE (pulmonary thromboembolism) (HCC) 04/08/2018   Endometriosis 04/07/2018   Chest pain of uncertain etiology 04/09/2017   Asthma, mild intermittent, well-controlled 03/24/2014   Seasonal and perennial allergic rhinitis 03/24/2014    PCP: Jarrett Soho PA-C  REFERRING PROVIDER: Jarrett Soho PA-C   REFERRING DIAG: W09.81 right achilles tendinitis  THERAPY DIAG:  Right ankle pain; weakness Rationale for Evaluation and Treatment: Rehabilitation  ONSET DATE: 08/27/2023  SUBJECTIVE:   SUBJECTIVE STATEMENT: Patient reports she is doing ok.  Lifted last night.  That doesn't bother me.  She is limping slightly.   PERTINENT HISTORY: Does Olympic weight lifting I ruptured an ankle tendon 2 years ago and wore a boot.   Sprained right ankle 2 big ones, I used to run with [employer] (teenager) PMH: Plavix with Hx of  DVT/PE 5 years ago, asthma, allergies, LBP  PAIN:   Are you having pain? Yes NPRS scale: 0/10 at the moment Pain location: tender and stiff lateral and medial heel Pain orientation: Right  PAIN TYPE: tight Pain description: intermittent  Aggravating factors: wearing flats (wearing [employer]OK[employer] today running shoes 46 year old);  mornings; running; up and down stairs Relieving factors: at rest   PRECAUTIONS: None    WEIGHT BEARING RESTRICTIONS: No  FALLS:  Has patient fallen in last 6 months? No  LIVING ENVIRONMENT: Lives with: lives with their family Lives in: House/apartment Stairs: been doing one at a time at home OCCUPATION: some days on feet a lot  PLOF:  Independent  PATIENT GOALS: stronger ankle; a sense of control over tendons; no pain or inflammation  NEXT MD VISIT: as needed  OBJECTIVE:  Note: Objective measures were completed at Evaluation unless otherwise noted.  DIAGNOSTIC FINDINGS: none  PATIENT SURVEYS:  LEFS 57/80  COGNITION: Overall cognitive status: Within functional limits for tasks assessed      EDEMA:  No edema today but pt reports achilles swelling initially  MUSCLE LENGTH:  decreased gastroc soleus muscle lengths; calcaneal hypomobility especially eversion   POSTURE:right WNLs;  left mild pes planus; pt able to actively contract intrinsics for better arch/alignment  PALPATION: Right tender points in gastroc and soleus  LOWER EXTREMITY ROM: full ankle ROM symmetrical with right  LOWER EXTREMITY MMT:  right PF 4-/5, right DF 5/5, inversion and eversion 4+/5, foot intrinsics 4+/5;  left grossly 5/5   FUNCTIONAL TESTS:  Difficulty stabilizing on right with SLS 10 sec; left 15 sec Pain with double heel raise 5x Able to do 1x right single leg heel raise but painful;  left single leg heel raise 5x  GAIT: Comments: WNLs in HOKA shoes   TODAY'S TREATMENT:                                                                                                                              DATE: 11/1 Nustep x 5 min level 5 (PT present to discuss status) Toe scrunch  x 10 with towel (10 pulls) Short foot  2 x 10 Inv/Ev towel push x 10 each side Marble pick up (whole cup) Long sitting : ankle eversion with yellow band (due to chronic ankle sprains leaving her with hypermobility on inversion) Educated on anatomy of the foot and ankle, discussed various tape and taping techniques Manual: PROM to left ankle, STM to plantar fascia and gastroc-soleus as well as achilles,  IASTM with hawks grip x 3 min to achilles and plantar fascia, isolated achilles stretch.   DATE: 10/25 Nustep x 5 min level 5 (PT present to discuss  status) Short foot  2 x 10 Inv/Ev towel push x 10 each side Toe scrunch 2 x 10 Marble pick up (whole cup) Cone touches 3 x 10 (cone on floor) Gastroc stretch on rocker board 10 x 10 sec Soleus stretch  on rocker board (single leg) 5 x 10 sec Trigger Point Dry-Needling  Treatment instructions: Expect mild to moderate muscle soreness. S/S of pneumothorax if dry needled over a lung field, and to seek immediate medical attention should they occur. Patient verbalized understanding of these instructions and education. Patient Consent Given: Yes Education handout provided: Yes Muscles treated: gastroc and soleus right  Electrical stimulation performed: No Parameters: N/A Treatment response/outcome: Skilled palpation used to identify taut bands and trigger points.  Once identified, dry needling techniques used to treat these areas.  Twitch response ellicited along with palpable elongation of muscle.  Following treatment, patient reported "it feels like putty".    DATE: 10/15  Initial eval   PATIENT EDUCATION:  Education details: Educated patient on anatomy and physiology of current symptoms, prognosis, plan of care as well as initial self care strategies to promote recovery;  PEACE and LOVE acronyms per pt instructions; discussed checking mileage using running app on current running shoes (time to replace?); discussed orthotic options Person educated: Patient Education method: Explanation Education comprehension: verbalized understanding  HOME EXERCISE PROGRAM: Access Code: [email or web address] URL: [email or web address] Date: 09/10/2023 Prepared by: Lavinia Sharps  Exercises - Long Sitting Ankle Plantar Flexion with Resistance (Mirrored)  - 1 x daily - 7 x weekly - 5 sets - 1 reps - 45 hold - Soleus Heel Raise in Stride Stance Position  - 1 x daily - 7 x weekly - 5 sets - 1 reps - 45 hold - Single Leg Isometric Heel Raise at Wall (Mirrored)  - 1 x daily - 7 x weekly - 5 sets - 1 reps -  45 hold - Gastroc Stretch on Wall  - 1 x daily - 7 x weekly - 1 sets - 3 reps - 20-30 hold - Seated Gastroc Stretch with Strap  - 1 x daily - 7 x weekly - 1 sets - 3 reps - 20-30 hold  ASSESSMENT:  CLINICAL IMPRESSION: Hooria did not seem to respond to the DN much.  She is limping on start up today.  She denies any increase in symptoms.  We focused on manual techniques today.  She has notable laxity on inversion.  We discussed need for eversion strengthening and added this to HEP.  She responded well to manual techniques and reports decreased pain at end of session.  She is compliant and well motivated.  She would benefit from continued PT to address these impairments.   OBJECTIVE IMPAIRMENTS: decreased activity tolerance, difficulty walking, decreased strength, impaired perceived functional ability, and pain.   ACTIVITY LIMITATIONS: locomotion level  PARTICIPATION LIMITATIONS: community activity and running and weight lifting  PERSONAL FACTORS: 1 comorbidity: prior history of ankle injury  are also affecting patient's functional outcome.   REHAB POTENTIAL: Good  CLINICAL DECISION MAKING: Stable/uncomplicated  EVALUATION COMPLEXITY: Low   GOALS: Goals reviewed with patient? Yes  SHORT TERM GOALS: Target date: 10/22/2023   The patient will demonstrate knowledge of basic self care strategies and exercises to promote healing  Baseline: Goal status: INITIAL  2.  The patient will be able to walk a mile with min difficulty Baseline:  Goal status: INITIAL  3.  The patient will be able to do Olympic weightlifting with modifications as needed Baseline:  Goal status: INITIAL  4.  The patient will have improved strength for 20x double leg heel raises with pain 3/10 Baseline:  Goal status: INITIAL     LONG TERM GOALS: Target date: 12/03/2023      The patient will  be independent in a safe self progression of a home exercise program to promote further recovery of function   Baseline:  Goal status: INITIAL  2.  The patient will report a 75% improvement in pain levels with functional activities which are currently difficult including walking, running, ascending and descending stairs Baseline:  Goal status: INITIAL  3.  The patient will have good strength in gastroc/soleus muscles for single leg impact activities like running and hopping Baseline:  Goal status: INITIAL  4.  The patient will be able to return to a running program (intervals if needed) Baseline:  Goal status: INITIAL  5.  LEFS improved to 67/80 indicating decreased pain and increased function Baseline:  Goal status: INITIAL     PLAN:  PT FREQUENCY: 1x/week  PT DURATION: 12 weeks  PLANNED INTERVENTIONS: 97164- PT Re-evaluation, 97110-Therapeutic exercises, 97530- Therapeutic activity, [id number]- Neuromuscular re-education, 97535- Self Care, 44010- Manual therapy, [id number]- Aquatic Therapy, 97014- Electrical stimulation (unattended), [id number]- Electrical stimulation (manual), [id number]- Vasopneumatic device, [id number]- Ultrasound, [id number]- Ionotophoresis 4mg /ml Dexamethasone, Patient/Family education, Taping, Dry Needling, Joint mobilization, Cryotherapy, and Moist heat  PLAN FOR NEXT SESSION: Possibly try kinesio taping;  gastroc and soleus lengthening; gradual progressive loading of achilles (isometrics started today)  Victorino Dike B. Trea Latner, PT 09/27/23 10:37 AM [facility] [address], [address] [place], [place] [address] Phone # [phone] Fax [phone]

## 2023-09-30 ENCOUNTER — Other Ambulatory Visit (HOSPITAL_COMMUNITY): Payer: Self-pay

## 2023-09-30 ENCOUNTER — Telehealth: Payer: Self-pay

## 2023-09-30 NOTE — Telephone Encounter (Signed)
*  Pulm  Pharmacy Patient Advocate Encounter  Received notification from [facility] [employer] that Prior Authorization for Budesonide-Formoterol Fumarate 160-4.5MCG/ACT aerosol  has been APPROVED from 09/30/2023 to 09/29/2024   PA #/Case ID/Reference #: [facility]

## 2023-10-04 ENCOUNTER — Ambulatory Visit: Payer: BC Managed Care – PPO

## 2023-10-04 DIAGNOSIS — M25571 Pain in right ankle and joints of right foot: Secondary | ICD-10-CM

## 2023-10-04 DIAGNOSIS — F4010 Social phobia, unspecified: Secondary | ICD-10-CM | POA: Diagnosis not present

## 2023-10-04 DIAGNOSIS — R262 Difficulty in walking, not elsewhere classified: Secondary | ICD-10-CM

## 2023-10-04 DIAGNOSIS — M6281 Muscle weakness (generalized): Secondary | ICD-10-CM

## 2023-10-04 DIAGNOSIS — R252 Cramp and spasm: Secondary | ICD-10-CM | POA: Diagnosis not present

## 2023-10-04 NOTE — Therapy (Signed)
OUTPATIENT PHYSICAL THERAPY LOWER EXTREMITY TREATMENT   Patient Name: Sierra Olsen MRN: [id number] DOB:1976-07-28, 47 y.o., female Today's Date: 10/04/2023  END OF SESSION:  PT End of Session - 10/04/23 0929     Visit Number 4    Date for PT Re-Evaluation 12/03/23    Authorization Type BCBS 60 VL--used 19 this year    PT Start Time 0845    PT Stop Time 0929    PT Time Calculation (min) 44 min    Activity Tolerance Patient tolerated treatment well    Behavior During Therapy WFL for tasks assessed/performed              Past Medical History:  Diagnosis Date   Allergy    Anxiety    Anxiety    Asthma    Celiac disease    Celiac disease    DVT (deep venous thrombosis) (HCC)    Of iliac vein of LLE   DVT (deep venous thrombosis) (HCC) 01/12/2019   Endometriosis    Endometriosis    Environmental allergies    Epigastric pain    Family history of adverse reaction to anesthesia    SISTER HAD TROUBLE BREATHING   GERD (gastroesophageal reflux disease)    Headache    Hx of migraine headaches    Hx of migraine headaches    Hypoglycemia    Hypoglycemia    Leukocytosis    Low back pain    Nausea    PE (pulmonary thromboembolism) (HCC) 03/2018   PE (pulmonary thromboembolism) (HCC)    PVC (premature ventricular contraction)    Retinal edema    Retinal edema    RUQ abdominal pain    Tinnitus    Tinnitus    Past Surgical History:  Procedure Laterality Date   ABDOMINAL HYSTERECTOMY     CORONARY ULTRASOUND/IVUS N/A 01/12/2019   Procedure: INTRAVASCULAR ULTRASOUND/IVUS;  Surgeon: Maeola Harman, MD;  Location: [facility] INVASIVE CV LAB;  Service: Cardiovascular;  Laterality: N/A;   DILATION AND CURETTAGE OF UTERUS     LOWER EXTREMITY VENOGRAPHY Left 01/12/2019   Procedure: LOWER EXTREMITY VENOGRAPHY;  Surgeon: Maeola Harman, MD;  Location: [facility] INVASIVE CV LAB;  Service: Cardiovascular;  Laterality: Left;   PERIPHERAL VASCULAR INTERVENTION Left  01/12/2019   Procedure: PERIPHERAL VASCULAR INTERVENTION;  Surgeon: Maeola Harman, MD;  Location: [facility] INVASIVE CV LAB;  Service: Cardiovascular;  Laterality: Left;  common and external iliac venous Ivus and stent   tubes in ears     age 54   WISDOM TOOTH EXTRACTION     Patient Active Problem List   Diagnosis Date Noted   Palpitations 05/25/2022   DVT (deep venous thrombosis) (HCC) 01/12/2019   PE (pulmonary thromboembolism) (HCC) 04/08/2018   Endometriosis 04/07/2018   Chest pain of uncertain etiology 04/09/2017   Asthma, mild intermittent, well-controlled 03/24/2014   Seasonal and perennial allergic rhinitis 03/24/2014    PCP: Jarrett Soho PA-C  REFERRING PROVIDER: Jarrett Soho PA-C   REFERRING DIAG: W09.81 right achilles tendinitis  THERAPY DIAG:  Right ankle pain; weakness Rationale for Evaluation and Treatment: Rehabilitation  ONSET DATE: 08/27/2023  SUBJECTIVE:   SUBJECTIVE STATEMENT: Patient reports she is doing ok.  Lifted last night.  Limping slightly.  PERTINENT HISTORY: Does Olympic weight lifting I ruptured an ankle tendon 2 years ago and wore a boot.   Sprained right ankle 2 big ones, I used to run with [employer] (teenager) PMH: Plavix with Hx of DVT/PE 5 years ago, asthma, allergies, LBP  PAIN:   Are you having pain? Yes NPRS scale: 0/10 at the moment Pain location: tender and stiff lateral and medial heel Pain orientation: Right  PAIN TYPE: tight Pain description: intermittent  Aggravating factors: wearing flats (wearing HOKAs today running shoes 46 year old);  mornings; running; up and down stairs Relieving factors: at rest   PRECAUTIONS: None    WEIGHT BEARING RESTRICTIONS: No  FALLS:  Has patient fallen in last 6 months? No  LIVING ENVIRONMENT: Lives with: lives with their family Lives in: House/apartment Stairs: been doing one at a time at home OCCUPATION: some days on feet a lot  PLOF: Independent  PATIENT GOALS:  stronger ankle; a sense of control over tendons; no pain or inflammation  NEXT MD VISIT: as needed  OBJECTIVE:  Note: Objective measures were completed at Evaluation unless otherwise noted.  DIAGNOSTIC FINDINGS: none  PATIENT SURVEYS:  LEFS 57/80  COGNITION: Overall cognitive status: Within functional limits for tasks assessed      EDEMA:  No edema today but pt reports achilles swelling initially  MUSCLE LENGTH:  decreased gastroc soleus muscle lengths; calcaneal hypomobility especially eversion   POSTURE:right WNLs;  left mild pes planus; pt able to actively contract intrinsics for better arch/alignment  PALPATION: Right tender points in gastroc and soleus  LOWER EXTREMITY ROM: full ankle ROM symmetrical with right  LOWER EXTREMITY MMT:  right PF 4-/5, right DF 5/5, inversion and eversion 4+/5, foot intrinsics 4+/5;  left grossly 5/5   FUNCTIONAL TESTS:  Difficulty stabilizing on right with SLS 10 sec; left 15 sec Pain with double heel raise 5x Able to do 1x right single leg heel raise but painful;  left single leg heel raise 5x  GAIT: Comments: WNLs in HOKA shoes   TODAY'S TREATMENT:                                                                                                                              DATE: 11/8 Nustep x 5 min level 5 (PT present to discuss status) Toe scrunch  x 10 with towel (10 pulls) Short foot  2 x 10 Inv/Ev towel push x 10 each side Prostretch x 5 each on left foot of gastroc stretch, then soleus stretch holding 10 sec each Manual: PROM to left ankle, STM to plantar fascia and gastroc-soleus as well as achilles,  rolling x 3 min to achilles and plantar fascia, isolated achilles stretch. Trigger Point Dry-Needling  Treatment instructions: Expect mild to moderate muscle soreness. S/S of pneumothorax if dry needled over a lung field, and to seek immediate medical attention should they occur. Patient verbalized understanding of these  instructions and education. Patient Consent Given: Yes Education handout provided: Yes Muscles treated: right lateral gastroc and soleus Electrical stimulation performed: No Parameters: N/A Treatment response/outcome: Skilled palpation used to identify taut bands and trigger points.  Once identified, dry needling techniques used to treat these areas.  Twitch response ellicited along with  palpable elongation of muscle.  Following treatment, patient reported feeling improved push off and less pain.      DATE: 11/1 Nustep x 5 min level 5 (PT present to discuss status) Toe scrunch  x 10 with towel (10 pulls) Short foot  2 x 10 Inv/Ev towel push x 10 each side Marble pick up (whole cup) Long sitting : ankle eversion with yellow band (due to chronic ankle sprains leaving her with hypermobility on inversion) Educated on anatomy of the foot and ankle, discussed various tape and taping techniques Manual: PROM to left ankle, STM to plantar fascia and gastroc-soleus as well as achilles,  IASTM with hawks grip x 3 min to achilles and plantar fascia, isolated achilles stretch.   DATE: 10/25 Nustep x 5 min level 5 (PT present to discuss status) Short foot  2 x 10 Inv/Ev towel push x 10 each side Toe scrunch 2 x 10 Marble pick up (whole cup) Cone touches 3 x 10 (cone on floor) Gastroc stretch on rocker board 10 x 10 sec Soleus stretch on rocker board (single leg) 5 x 10 sec Trigger Point Dry-Needling  Treatment instructions: Expect mild to moderate muscle soreness. S/S of pneumothorax if dry needled over a lung field, and to seek immediate medical attention should they occur. Patient verbalized understanding of these instructions and education. Patient Consent Given: Yes Education handout provided: Yes Muscles treated: gastroc and soleus right  Electrical stimulation performed: No Parameters: N/A Treatment response/outcome: Skilled palpation used to identify taut bands and trigger points.  Once  identified, dry needling techniques used to treat these areas.  Twitch response ellicited along with palpable elongation of muscle.  Following treatment, patient reported "it feels like putty".   Ktape for achilles tendinitis on right ankle.     DATE: 10/15  Initial eval   PATIENT EDUCATION:  Education details: Educated patient on anatomy and physiology of current symptoms, prognosis, plan of care as well as initial self care strategies to promote recovery;  PEACE and LOVE acronyms per pt instructions; discussed checking mileage using running app on current running shoes (time to replace?); discussed orthotic options Person educated: Patient Education method: Explanation Education comprehension: verbalized understanding  HOME EXERCISE PROGRAM: Access Code: [email or web address] URL: [email or web address] Date: 09/10/2023 Prepared by: Lavinia Sharps  Exercises - Long Sitting Ankle Plantar Flexion with Resistance (Mirrored)  - 1 x daily - 7 x weekly - 5 sets - 1 reps - 45 hold - Soleus Heel Raise in Stride Stance Position  - 1 x daily - 7 x weekly - 5 sets - 1 reps - 45 hold - Single Leg Isometric Heel Raise at Wall (Mirrored)  - 1 x daily - 7 x weekly - 5 sets - 1 reps - 45 hold - Gastroc Stretch on Wall  - 1 x daily - 7 x weekly - 1 sets - 3 reps - 20-30 hold - Seated Gastroc Stretch with Strap  - 1 x daily - 7 x weekly - 1 sets - 3 reps - 20-30 hold  ASSESSMENT:  CLINICAL IMPRESSION: Petty was found to have a fairly prominent taut band in the lateral achilles today and was willing to try the DN again.   She had a fairly aggressive twitch response today and seem to have improved push off and less pain with ambulation following treatment.  We continued to focus on manual techniques and added prostretch.   She is compliant and well motivated.  She would benefit from continued PT to address  these impairments.   OBJECTIVE IMPAIRMENTS: decreased activity tolerance, difficulty walking,  decreased strength, impaired perceived functional ability, and pain.   ACTIVITY LIMITATIONS: locomotion level  PARTICIPATION LIMITATIONS: community activity and running and weight lifting  PERSONAL FACTORS: 1 comorbidity: prior history of ankle injury  are also affecting patient's functional outcome.   REHAB POTENTIAL: Good  CLINICAL DECISION MAKING: Stable/uncomplicated  EVALUATION COMPLEXITY: Low   GOALS: Goals reviewed with patient? Yes  SHORT TERM GOALS: Target date: 10/22/2023   The patient will demonstrate knowledge of basic self care strategies and exercises to promote healing  Baseline: Goal status: MET  2.  The patient will be able to walk a mile with min difficulty Baseline:  Goal status: In progress  3.  The patient will be able to do Olympic weightlifting with modifications as needed Baseline:  Goal status: MET  4.  The patient will have improved strength for 20x double leg heel raises with pain 3/10 Baseline:  Goal status: In progress     LONG TERM GOALS: Target date: 12/03/2023      The patient will be independent in a safe self progression of a home exercise program to promote further recovery of function  Baseline:  Goal status: INITIAL  2.  The patient will report a 75% improvement in pain levels with functional activities which are currently difficult including walking, running, ascending and descending stairs Baseline:  Goal status: INITIAL  3.  The patient will have good strength in gastroc/soleus muscles for single leg impact activities like running and hopping Baseline:  Goal status: INITIAL  4.  The patient will be able to return to a running program (intervals if needed) Baseline:  Goal status: INITIAL  5.  LEFS improved to 67/80 indicating decreased pain and increased function Baseline:  Goal status: INITIAL     PLAN:  PT FREQUENCY: 1x/week  PT DURATION: 12 weeks  PLANNED INTERVENTIONS: 97164- PT Re-evaluation,  97110-Therapeutic exercises, 97530- Therapeutic activity, [id number]- Neuromuscular re-education, 97535- Self Care, [id number]- Manual therapy, [id number]- Aquatic Therapy, 97014- Electrical stimulation (unattended), [id number]- Electrical stimulation (manual), [id number]- Vasopneumatic device, [id number]- Ultrasound, [id number]- Ionotophoresis 4mg /ml Dexamethasone, Patient/Family education, Taping, Dry Needling, Joint mobilization, Cryotherapy, and Moist heat  PLAN FOR NEXT SESSION: Assess response to DN and kinesio taping;  gastroc and soleus lengthening; gradual progressive loading of achilles (isometrics started today)  Victorino Dike B. Atharva Mirsky, PT 10/04/23 10:20 AM [facility] [address] [place], [place] [address] Phone # [phone] Fax [phone]

## 2023-11-01 ENCOUNTER — Ambulatory Visit: Payer: BC Managed Care – PPO | Attending: Family Medicine | Admitting: Physical Therapy

## 2023-11-01 DIAGNOSIS — M6281 Muscle weakness (generalized): Secondary | ICD-10-CM | POA: Diagnosis not present

## 2023-11-01 DIAGNOSIS — R262 Difficulty in walking, not elsewhere classified: Secondary | ICD-10-CM | POA: Insufficient documentation

## 2023-11-01 DIAGNOSIS — R252 Cramp and spasm: Secondary | ICD-10-CM | POA: Insufficient documentation

## 2023-11-01 DIAGNOSIS — M25571 Pain in right ankle and joints of right foot: Secondary | ICD-10-CM | POA: Insufficient documentation

## 2023-11-01 DIAGNOSIS — F4010 Social phobia, unspecified: Secondary | ICD-10-CM | POA: Diagnosis not present

## 2023-11-01 NOTE — Therapy (Signed)
OUTPATIENT PHYSICAL THERAPY LOWER EXTREMITY TREATMENT   Patient Name: Sierra Olsen MRN: [id number] DOB:[birth year], 47 y.o., female Today's Date: 11/01/2023  END OF SESSION:  PT End of Session - 11/01/23 0938     Visit Number 5    Date for PT Re-Evaluation 12/03/23    Authorization Type [employer]BS 60 VL--used 19 this year    PT Start Time 0938    PT Stop Time 1016    PT Time Calculation (min) 38 min    Activity Tolerance Patient tolerated treatment well              Past Medical History:  Diagnosis Date   Allergy    Anxiety    Anxiety    Asthma    Celiac disease    Celiac disease    DVT (deep venous thrombosis) (HCC)    Of iliac vein of LLE   DVT (deep venous thrombosis) (HCC) 01/12/2019   Endometriosis    Endometriosis    Environmental allergies    Epigastric pain    Family history of adverse reaction to anesthesia    SISTER HAD TROUBLE BREATHING   GERD (gastroesophageal reflux disease)    Headache    Hx of migraine headaches    Hx of migraine headaches    Hypoglycemia    Hypoglycemia    Leukocytosis    Low back pain    Nausea    PE (pulmonary thromboembolism) (HCC) 03/2018   PE (pulmonary thromboembolism) (HCC)    PVC (premature ventricular contraction)    Retinal edema    Retinal edema    RUQ abdominal pain    Tinnitus    Tinnitus    Past Surgical History:  Procedure Laterality Date   ABDOMINAL HYSTERECTOMY     CORONARY ULTRASOUND/IVUS N/A 01/12/2019   Procedure: INTRAVASCULAR ULTRASOUND/IVUS;  Surgeon: Maeola Harman, MD;  Location: [facility] INVASIVE CV LAB;  Service: Cardiovascular;  Laterality: N/A;   DILATION AND CURETTAGE OF UTERUS     LOWER EXTREMITY VENOGRAPHY Left 01/12/2019   Procedure: LOWER EXTREMITY VENOGRAPHY;  Surgeon: Maeola Harman, MD;  Location: [facility] INVASIVE CV LAB;  Service: Cardiovascular;  Laterality: Left;   PERIPHERAL VASCULAR INTERVENTION Left 01/12/2019   Procedure: PERIPHERAL VASCULAR INTERVENTION;  Surgeon:  Maeola Harman, MD;  Location: [facility] INVASIVE CV LAB;  Service: Cardiovascular;  Laterality: Left;  common and external iliac venous Ivus and stent   tubes in ears     age 47   WISDOM TOOTH EXTRACTION     Patient Active Problem List   Diagnosis Date Noted   Palpitations 05/25/2022   DVT (deep venous thrombosis) (HCC) 01/12/2019   PE (pulmonary thromboembolism) (HCC) 04/08/2018   Endometriosis 04/07/2018   Chest pain of uncertain etiology 04/09/2017   Asthma, mild intermittent, well-controlled 03/24/2014   Seasonal and perennial allergic rhinitis 03/24/2014    PCP: Jarrett Soho PA-C  REFERRING PROVIDER: Jarrett Soho PA-C   REFERRING DIAG: I69.62 right achilles tendinitis  THERAPY DIAG:  Right ankle pain; weakness Rationale for Evaluation and Treatment: Rehabilitation  ONSET DATE: 08/27/2023  SUBJECTIVE:   SUBJECTIVE STATEMENT: She has improved since start of care.  Feels she is able to walk better.  Calf stiffness especially medial calf.  I bought a Geophysical data processor.  I've been able to do cleans at the gym.  I bought a stationary bike since I'm not running. PERTINENT HISTORY: Does Olympic weight lifting I ruptured an ankle tendon 2 years ago and wore a boot.   Sprained right ankle  2 big ones, I used to run with [employer] (teenager) PMH: Plavix with Hx of DVT/PE 5 years ago, asthma, allergies, LBP  PAIN:   Are you having pain? Yes NPRS scale: 3/10 tightness in calf Pain location: tender and stiff lateral and medial heel Pain orientation: Right  PAIN TYPE: tight Pain description: intermittent  Aggravating factors: wearing flats (wearing HOKAs today running shoes 47 year old);  mornings; running; up and down stairs Relieving factors: at rest   PRECAUTIONS: None    WEIGHT BEARING RESTRICTIONS: No  FALLS:  Has patient fallen in last 6 months? No  LIVING ENVIRONMENT: Lives with: lives with their family Lives in: House/apartment Stairs: been doing one at a  time at home OCCUPATION: some days on feet a lot  PLOF: Independent  PATIENT GOALS: stronger ankle; a sense of control over tendons; no pain or inflammation  NEXT MD VISIT: as needed  OBJECTIVE:  Note: Objective measures were completed at Evaluation unless otherwise noted.  DIAGNOSTIC FINDINGS: none  PATIENT SURVEYS:  LEFS 57/80  COGNITION: Overall cognitive status: Within functional limits for tasks assessed      EDEMA:  No edema today but pt reports achilles swelling initially  MUSCLE LENGTH:  decreased gastroc soleus muscle lengths; calcaneal hypomobility especially eversion   POSTURE:right WNLs;  left mild pes planus; pt able to actively contract intrinsics for better arch/alignment  PALPATION: Right tender points in gastroc and soleus  LOWER EXTREMITY ROM: full ankle ROM symmetrical with right  LOWER EXTREMITY MMT:  right PF 4-/5, right DF 5/5, inversion and eversion 4+/5, foot intrinsics 4+/5;  left grossly 5/5   FUNCTIONAL TESTS:  Difficulty stabilizing on right with SLS 10 sec; left 15 sec Pain with double heel raise 5x Able to do 1x right single leg heel raise but painful;  left single leg heel raise 5x  GAIT: Comments: WNLs in HOKA shoes   TODAY'S TREATMENT:   DATE: 12/6 Nustep x 5 min level 5 (PT present to discuss status) Wall gastroc and plantar fascia stretch 20 sec 3x Standing on foam short foot ex 12x without foam added to HEP Standing on one foot on foam and balance 5 sec 10x  SLS woodpecker ex 10x (added to HEP) Bil heel raises with small green ball squeeze b/w heels 10x Manual: instrument assisted stainless steel blade soft tissue mobilization to right gastroc/soleus Trigger Point Dry-Needling  Treatment instructions: Expect mild to moderate muscle soreness. S/S of pneumothorax if dry needled over a lung field, and to seek immediate medical attention should they occur. Patient verbalized understanding of these instructions and  education. Patient Consent Given: Yes Education handout provided: Yes Muscles treated: right medial and lateral gastroc  Electrical stimulation performed: No Parameters: N/A Treatment response/outcome: Skilled palpation used to identify taut bands and trigger points.  Once identified, dry needling techniques used to treat these areas  DATE: 11/8 Nustep x 5 min level 5 (PT present to discuss status) Toe scrunch  x 10 with towel (10 pulls) Short foot  2 x 10 Inv/Ev towel push x 10 each side Prostretch x 5 each on left foot of gastroc stretch, then soleus stretch holding 10 sec each Manual: PROM to left ankle, STM to plantar fascia and gastroc-soleus as well as achilles,  rolling x 3 min to achilles and plantar fascia, isolated achilles stretch. Trigger Point Dry-Needling  Treatment instructions: Expect mild to moderate muscle soreness. S/S of pneumothorax if dry needled over a lung field, and to seek immediate medical attention should they occur. Patient verbalized understanding of these instructions and education. Patient Consent Given: Yes Education handout provided: Yes Muscles treated: right lateral gastroc and soleus Electrical stimulation performed: No Parameters: N/A Treatment response/outcome: Skilled palpation used to identify taut bands and trigger points.  Once identified, dry needling techniques used to treat these areas.  Twitch response ellicited along with palpable elongation of muscle.  Following treatment, patient reported feeling improved push off and less pain.      DATE: 11/1 Nustep x 5 min level 5 (PT present to discuss status) Toe scrunch  x 10 with towel (10 pulls) Short foot  2 x 10 Inv/Ev towel push x 10 each side Marble pick up (whole cup) Long sitting : ankle eversion with yellow band (due to chronic ankle sprains leaving her with  hypermobility on inversion) Educated on anatomy of the foot and ankle, discussed various tape and taping techniques Manual: PROM to left ankle, STM to plantar fascia and gastroc-soleus as well as achilles,  IASTM with hawks grip x 3 min to achilles and plantar fascia, isolated achilles stretch.   DATE: 10/25 Nustep x 5 min level 5 (PT present to discuss status) Short foot  2 x 10 Inv/Ev towel push x 10 each side Toe scrunch 2 x 10 Marble pick up (whole cup) Cone touches 3 x 10 (cone on floor) Gastroc stretch on rocker board 10 x 10 sec Soleus stretch on rocker board (single leg) 5 x 10 sec Trigger Point Dry-Needling  Treatment instructions: Expect mild to moderate muscle soreness. S/S of pneumothorax if dry needled over a lung field, and to seek immediate medical attention should they occur. Patient verbalized understanding of these instructions and education. Patient Consent Given: Yes Education handout provided: Yes Muscles treated: gastroc and soleus right  Electrical stimulation performed: No Parameters: N/A Treatment response/outcome: Skilled palpation used to identify taut bands and trigger points.  Once identified, dry needling techniques used to treat these areas.  Twitch response ellicited along with palpable elongation of muscle.  Following treatment, patient reported "it feels like putty".   Ktape for achilles tendinitis on right ankle.     PATIENT EDUCATION:  Education details: Educated patient on anatomy and physiology of current symptoms, prognosis, plan of care as well as initial self care strategies to promote recovery;  PEACE and LOVE acronyms per pt instructions; discussed checking mileage using running app on current running shoes (time to replace?); discussed orthotic options Person educated: Patient Education method: Explanation Education comprehension: verbalized understanding  HOME EXERCISE PROGRAM: Access Code: [email or web address] URL:  [email or web address] Date: 11/01/2023 Prepared by: Lavinia Sharps  Exercises - Long Sitting Ankle Plantar Flexion with Resistance (Mirrored)  - 1 x daily - 7 x weekly - 5 sets - 1 reps - 45 hold - Soleus Heel Raise in Stride Stance Position  - 1 x daily - 7 x weekly -  5 sets - 1 reps - 45 hold - Single Leg Isometric Heel Raise at Wall (Mirrored)  - 1 x daily - 7 x weekly - 5 sets - 1 reps - 45 hold - Gastroc Stretch on Wall  - 1 x daily - 7 x weekly - 1 sets - 3 reps - 20-30 hold - Seated Gastroc Stretch with Strap  - 1 x daily - 7 x weekly - 1 sets - 3 reps - 20-30 hold - Woodpeckers One Leg (Mirrored)  - 2-3 x daily - 7 x weekly - 1 sets - 10 reps - Arch Lifting  - 2-3 x daily - 7 x weekly - 1 sets - 10 reps - Standing Calf Raise With Small Ball at Heels  - 2-3 x daily - 7 x weekly - 1 sets - 10 reps ASSESSMENT:  CLINICAL IMPRESSION: The patient is ambulating well in [employer] clogs without a limp this morning.  She notes definite improvement since start of care.  Able to progress HEP with increased load/weight bearing with pain level remaining low 1-2/10.  Tender points in medial and lateral gastroc released with DN using pistoning technique.  Therapist monitoring response to all interventions and modifying treatment accordingly.    OBJECTIVE IMPAIRMENTS: decreased activity tolerance, difficulty walking, decreased strength, impaired perceived functional ability, and pain.   ACTIVITY LIMITATIONS: locomotion level  PARTICIPATION LIMITATIONS: community activity and running and weight lifting  PERSONAL FACTORS: 1 comorbidity: prior history of ankle injury  are also affecting patient's functional outcome.   REHAB POTENTIAL: Good  CLINICAL DECISION MAKING: Stable/uncomplicated  EVALUATION COMPLEXITY: Low   GOALS: Goals reviewed with patient? Yes  SHORT TERM GOALS: Target date: 10/22/2023   The patient will demonstrate knowledge of basic self care strategies and  exercises to promote healing  Baseline: Goal status: MET  2.  The patient will be able to walk a mile with min difficulty Baseline:  Goal status: In progress  3.  The patient will be able to do Olympic weightlifting with modifications as needed Baseline:  Goal status: MET  4.  The patient will have improved strength for 20x double leg heel raises with pain 3/10 Baseline:  Goal status: In progress     LONG TERM GOALS: Target date: 12/03/2023      The patient will be independent in a safe self progression of a home exercise program to promote further recovery of function  Baseline:  Goal status: INITIAL  2.  The patient will report a 75% improvement in pain levels with functional activities which are currently difficult including walking, running, ascending and descending stairs Baseline:  Goal status: INITIAL  3.  The patient will have good strength in gastroc/soleus muscles for single leg impact activities like running and hopping Baseline:  Goal status: INITIAL  4.  The patient will be able to return to a running program (intervals if needed) Baseline:  Goal status: INITIAL  5.  LEFS improved to 67/80 indicating decreased pain and increased function Baseline:  Goal status: INITIAL     PLAN:  PT FREQUENCY: 1x/week  PT DURATION: 12 weeks  PLANNED INTERVENTIONS: 97164- PT Re-evaluation, 97110-Therapeutic exercises, 97530- Therapeutic activity, 97112- Neuromuscular re-education, 97535- Self Care, 84696- Manual therapy, [id number]- Aquatic Therapy, 97014- Electrical stimulation (unattended), [id number]- Electrical stimulation (manual), [id number]- Vasopneumatic device, [id number]- Ultrasound, [id number]- Ionotophoresis 4mg /ml Dexamethasone, Patient/Family education, Taping, Dry Needling, Joint mobilization, Cryotherapy, and Moist heat  PLAN FOR NEXT SESSION: Assess response to DN and newly added standing ex's; kinesio taping;  gastroc and soleus lengthening; gradual progressive loading of  achilles   Lavinia Sharps, PT 11/01/23 12:17 PM Phone: [phone] Fax: [phone]

## 2023-11-04 ENCOUNTER — Other Ambulatory Visit: Payer: Self-pay | Admitting: Vascular Surgery

## 2023-11-08 ENCOUNTER — Ambulatory Visit: Payer: BC Managed Care – PPO

## 2023-11-08 DIAGNOSIS — R252 Cramp and spasm: Secondary | ICD-10-CM

## 2023-11-08 DIAGNOSIS — M25571 Pain in right ankle and joints of right foot: Secondary | ICD-10-CM

## 2023-11-08 DIAGNOSIS — M6281 Muscle weakness (generalized): Secondary | ICD-10-CM

## 2023-11-08 DIAGNOSIS — R262 Difficulty in walking, not elsewhere classified: Secondary | ICD-10-CM

## 2023-11-08 NOTE — Therapy (Signed)
OUTPATIENT PHYSICAL THERAPY LOWER EXTREMITY TREATMENT   Patient Name: Sierra Olsen MRN: [id number] DOB:10-16-1976, 47 y.o., female Today's Date: 11/08/2023  END OF SESSION:  PT End of Session - 11/08/23 0937     Visit Number 6    Date for PT Re-Evaluation 12/03/23    Authorization Type BCBS 60 VL--used 19 this year    PT Start Time 0935    PT Stop Time 1002    PT Time Calculation (min) 27 min    Activity Tolerance Patient tolerated treatment well    Behavior During Therapy WFL for tasks assessed/performed              Past Medical History:  Diagnosis Date   Allergy    Anxiety    Anxiety    Asthma    Celiac disease    Celiac disease    DVT (deep venous thrombosis) (HCC)    Of iliac vein of LLE   DVT (deep venous thrombosis) (HCC) 01/12/2019   Endometriosis    Endometriosis    Environmental allergies    Epigastric pain    Family history of adverse reaction to anesthesia    SISTER HAD TROUBLE BREATHING   GERD (gastroesophageal reflux disease)    Headache    Hx of migraine headaches    Hx of migraine headaches    Hypoglycemia    Hypoglycemia    Leukocytosis    Low back pain    Nausea    PE (pulmonary thromboembolism) (HCC) 03/2018   PE (pulmonary thromboembolism) (HCC)    PVC (premature ventricular contraction)    Retinal edema    Retinal edema    RUQ abdominal pain    Tinnitus    Tinnitus    Past Surgical History:  Procedure Laterality Date   ABDOMINAL HYSTERECTOMY     CORONARY ULTRASOUND/IVUS N/A 01/12/2019   Procedure: INTRAVASCULAR ULTRASOUND/IVUS;  Surgeon: Maeola Harman, MD;  Location: [facility] INVASIVE CV LAB;  Service: Cardiovascular;  Laterality: N/A;   DILATION AND CURETTAGE OF UTERUS     LOWER EXTREMITY VENOGRAPHY Left 01/12/2019   Procedure: LOWER EXTREMITY VENOGRAPHY;  Surgeon: Maeola Harman, MD;  Location: [facility] INVASIVE CV LAB;  Service: Cardiovascular;  Laterality: Left;   PERIPHERAL VASCULAR INTERVENTION Left  01/12/2019   Procedure: PERIPHERAL VASCULAR INTERVENTION;  Surgeon: Maeola Harman, MD;  Location: [facility] INVASIVE CV LAB;  Service: Cardiovascular;  Laterality: Left;  common and external iliac venous Ivus and stent   tubes in ears     age 89   WISDOM TOOTH EXTRACTION     Patient Active Problem List   Diagnosis Date Noted   Palpitations 05/25/2022   DVT (deep venous thrombosis) (HCC) 01/12/2019   PE (pulmonary thromboembolism) (HCC) 04/08/2018   Endometriosis 04/07/2018   Chest pain of uncertain etiology 04/09/2017   Asthma, mild intermittent, well-controlled 03/24/2014   Seasonal and perennial allergic rhinitis 03/24/2014    PCP: Jarrett Soho PA-C  REFERRING PROVIDER: Jarrett Soho PA-C   REFERRING DIAG: O13.08 right achilles tendinitis  THERAPY DIAG:  Right ankle pain; weakness Rationale for Evaluation and Treatment: Rehabilitation  ONSET DATE: 08/27/2023  SUBJECTIVE:   SUBJECTIVE STATEMENT: Patient arrives in boots today.  She explains that she is feeling like she can wear more shoes other than her athletic shoes.  She has also stopped running for a little while and is doing stationary bike which has helped.    PERTINENT HISTORY: Does Olympic weight lifting I ruptured an ankle tendon 2 years ago and  wore a boot.   Sprained right ankle 2 big ones, I used to run with [employer] (teenager) PMH: Plavix with Hx of DVT/PE 5 years ago, asthma, allergies, LBP  PAIN:   Are you having pain? Yes NPRS scale: 3/10 tightness in calf Pain location: tender and stiff lateral and medial heel Pain orientation: Right  PAIN TYPE: tight Pain description: intermittent  Aggravating factors: wearing flats (wearing HOKAs today running shoes 47 year old);  mornings; running; up and down stairs Relieving factors: at rest   PRECAUTIONS: None    WEIGHT BEARING RESTRICTIONS: No  FALLS:  Has patient fallen in last 6 months? No  LIVING ENVIRONMENT: Lives with: lives with  their family Lives in: House/apartment Stairs: been doing one at a time at home OCCUPATION: some days on feet a lot  PLOF: Independent  PATIENT GOALS: stronger ankle; a sense of control over tendons; no pain or inflammation  NEXT MD VISIT: as needed  OBJECTIVE:  Note: Objective measures were completed at Evaluation unless otherwise noted.  DIAGNOSTIC FINDINGS: none  PATIENT SURVEYS:  LEFS 57/80  COGNITION: Overall cognitive status: Within functional limits for tasks assessed      EDEMA:  No edema today but pt reports achilles swelling initially  MUSCLE LENGTH:  decreased gastroc soleus muscle lengths; calcaneal hypomobility especially eversion   POSTURE:right WNLs;  left mild pes planus; pt able to actively contract intrinsics for better arch/alignment  PALPATION: Right tender points in gastroc and soleus  LOWER EXTREMITY ROM: full ankle ROM symmetrical with right  LOWER EXTREMITY MMT:  right PF 4-/5, right DF 5/5, inversion and eversion 4+/5, foot intrinsics 4+/5;  left grossly 5/5   FUNCTIONAL TESTS:  Difficulty stabilizing on right with SLS 10 sec; left 15 sec Pain with double heel raise 5x Able to do 1x right single leg heel raise but painful;  left single leg heel raise 5x  GAIT: Comments: WNLs in HOKA shoes   TODAY'S TREATMENT:   DATE: 12/13 (patient requested short visit, has funeral to get to) Recumbent bike x 5 min level 1 (PT present to discuss status) Prostretch x 5 holding 10 sec  Bil heel raises with small green ball squeeze b/w heels 10x Standing on foam short foot ex 15x Standing on one foot on foam and balance 10 sec 10x  SLS on foam doing cone touches (cone on mat table) Manual: instrument assisted stainless steel blade soft tissue mobilization to right gastroc/soleus x 10 min  DATE: 12/6 Nustep x 5 min level 5 (PT present to discuss status) Wall gastroc and plantar fascia stretch 20 sec 3x Standing on foam short foot ex 12x without foam  added to HEP Standing on one foot on foam and balance 5 sec 10x  SLS woodpecker ex 10x (added to HEP) Bil heel raises with small green ball squeeze b/w heels 10x Manual: instrument assisted stainless steel blade soft tissue mobilization to right gastroc/soleus Trigger Point Dry-Needling  Treatment instructions: Expect mild to moderate muscle soreness. S/S of pneumothorax if dry needled over a lung field, and to seek immediate medical attention should they occur. Patient verbalized understanding of these instructions and education. Patient Consent Given: Yes Education handout provided: Yes Muscles treated: right medial and lateral gastroc  Electrical stimulation performed: No Parameters: N/A Treatment response/outcome: Skilled palpation used to identify taut bands and trigger points.  Once identified, dry needling techniques used to treat these areas  DATE: 11/8 Nustep x 5 min level 5 (PT present to discuss status) Toe scrunch  x 10 with towel (10 pulls) Short foot  2 x 10 Inv/Ev towel push x 10 each side Prostretch x 5 each on left foot of gastroc stretch, then soleus stretch holding 10 sec each Manual: PROM to left ankle, STM to plantar fascia and gastroc-soleus as well as achilles,  rolling x 3 min to achilles and plantar fascia, isolated achilles stretch. Trigger Point Dry-Needling  Treatment instructions: Expect mild to moderate muscle soreness. S/S of pneumothorax if dry needled over a lung field, and to seek immediate medical attention should they occur. Patient verbalized understanding of these instructions and education. Patient Consent Given: Yes Education handout provided: Yes Muscles treated: right lateral gastroc and soleus Electrical stimulation performed: No Parameters: N/A Treatment response/outcome: Skilled palpation used to identify taut bands and  trigger points.  Once identified, dry needling techniques used to treat these areas.  Twitch response ellicited along with palpable elongation of muscle.  Following treatment, patient reported feeling improved push off and less pain.       PATIENT EDUCATION:  Education details: Educated patient on anatomy and physiology of current symptoms, prognosis, plan of care as well as initial self care strategies to promote recovery;  PEACE and LOVE acronyms per pt instructions; discussed checking mileage using running app on current running shoes (time to replace?); discussed orthotic options Person educated: Patient Education method: Explanation Education comprehension: verbalized understanding  HOME EXERCISE PROGRAM: Access Code: [email or web address] URL: [email or web address] Date: 11/01/2023 Prepared by: Lavinia Sharps  Exercises - Long Sitting Ankle Plantar Flexion with Resistance (Mirrored)  - 1 x daily - 7 x weekly - 5 sets - 1 reps - 45 hold - Soleus Heel Raise in Stride Stance Position  - 1 x daily - 7 x weekly - 5 sets - 1 reps - 45 hold - Single Leg Isometric Heel Raise at Wall (Mirrored)  - 1 x daily - 7 x weekly - 5 sets - 1 reps - 45 hold - Gastroc Stretch on Wall  - 1 x daily - 7 x weekly - 1 sets - 3 reps - 20-30 hold - Seated Gastroc Stretch with Strap  - 1 x daily - 7 x weekly - 1 sets - 3 reps - 20-30 hold - Woodpeckers One Leg (Mirrored)  - 2-3 x daily - 7 x weekly - 1 sets - 10 reps - Arch Lifting  - 2-3 x daily - 7 x weekly - 1 sets - 10 reps - Standing Calf Raise With Small Ball at Heels  - 2-3 x daily - 7 x weekly - 1 sets - 10 reps ASSESSMENT:  CLINICAL IMPRESSION: The patient is ambulating well in boots without a limp this morning.  She feels like she is able to wear more of her dress shoe wear and not having to wear her athletic shoes as much. She has eliminated running for now and this is also helping.  She is compliant and well motivated.  She should continue to do  well.  She would benefit from skilled PT for her last few visits to meet final goals.     OBJECTIVE IMPAIRMENTS: decreased activity tolerance, difficulty walking, decreased strength, impaired perceived functional ability, and pain.   ACTIVITY LIMITATIONS: locomotion level  PARTICIPATION LIMITATIONS: community activity and running and weight lifting  PERSONAL FACTORS: 1 comorbidity: prior history of ankle injury  are also affecting patient's functional outcome.   REHAB POTENTIAL: Good  CLINICAL DECISION MAKING: Stable/uncomplicated  EVALUATION COMPLEXITY: Low   GOALS: Goals reviewed with patient? Yes  SHORT TERM GOALS: Target date: 10/22/2023   The patient will demonstrate knowledge of basic self care strategies and exercises to promote healing  Baseline: Goal status: MET  2.  The patient will be able to walk a mile with min difficulty Baseline:  Goal status: In progress  3.  The patient will be able to do Olympic weightlifting with modifications as needed Baseline:  Goal status: MET  4.  The patient will have improved strength for 20x double leg heel raises with pain 3/10 Baseline:  Goal status: MET 11/08/23     LONG TERM GOALS: Target date: 12/03/2023      The patient will be independent in a safe self progression of a home exercise program to promote further recovery of function  Baseline:  Goal status: In progress  2.  The patient will report a 75% improvement in pain levels with functional activities which are currently difficult including walking, running, ascending and descending stairs Baseline:  Goal status: In progress  3.  The patient will have good strength in gastroc/soleus muscles for single leg impact activities like running and hopping Baseline:  Goal status: INITIAL  4.  The patient will be able to return to a running program (intervals if needed) Baseline:  Goal status: INITIAL  5.  LEFS improved to 67/80 indicating decreased pain and  increased function Baseline:  Goal status: INITIAL     PLAN:  PT FREQUENCY: 1x/week  PT DURATION: 12 weeks  PLANNED INTERVENTIONS: 97164- PT Re-evaluation, 97110-Therapeutic exercises, 97530- Therapeutic activity, 97112- Neuromuscular re-education, 97535- Self Care, 16109- Manual therapy, [id number]- Aquatic Therapy, 97014- Electrical stimulation (unattended), [id number]- Electrical stimulation (manual), [id number]- Vasopneumatic device, [id number]- Ultrasound, [id number]- Ionotophoresis 4mg /ml Dexamethasone, Patient/Family education, Taping, Dry Needling, Joint mobilization, Cryotherapy, and Moist heat  PLAN FOR NEXT SESSION: Progress achilles loading and eccentric control.    Victorino Dike B. Devlin Mcveigh, PT 11/08/23 10:13 AM [facility] [address] [place], [place] [address] Phone # [phone] Fax [phone]

## 2023-11-13 DIAGNOSIS — F4010 Social phobia, unspecified: Secondary | ICD-10-CM | POA: Diagnosis not present

## 2023-11-14 NOTE — Progress Notes (Signed)
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 Had tubes in ears, otherwise no ENT surgery.  Celiac Positive- avoids gluten.Denies GERD Office Spirometry 02/10/18-WNL-FEV1 3.70/100%, FEV1 3.13/105%, ratio 1.85, FEF 25-75% 3.76/122%  -----------------------------------------------------------------------------------  .  11/09/22- 47 year old former smoker followed for Asthma, allergic Rhinitis, Aspirin Allergy, complicated by Celiac disease, Endometriosis, PAD/ [name], DVT/ PE 04/07/18/ Plavix, -Symbicort 160, Proair, Nasonex, Covid vax- 3 Phizer, 1 Moderna Flu vax-had ACT score- 25 She resolved COVID infection 2 weeks ago without antiviral therapy.  She was in [place] with a tour group and now feels fully recovered.  No significant asthma or rhinitis exacerbations with current meds.  Asked refills of Symbicort and ProAir. Has been evaluated for palpitation.  11/15/23- 47 year old former smoker followed for Asthma, allergic Rhinitis, Aspirin Allergy, complicated by Celiac disease, Endometriosis, PAD/ [name], DVT/ PE 04/07/18/ Plavix, -Symbicort 160, Proair, Nasonex, -----Doing well.  Only had to use inhaler once this year Discussed the use of AI scribe software for clinical note transcription with the patient, who gave verbal consent to proceed.  History of Present Illness   The patient, with a history of chronic back stiffness and asthma, reports completion of a long course of physical therapy for her back. Despite strengthening exercises, she continues to experience stiffness in her back.  Her asthma control has been good, with the generic Symbicort providing effective control. She has noticed some changes in her symptoms, potentially related to carpeting in her home, and plans to avoid carpet in her future  residence. She had to use her emergency inhaler for the first time in years after exposure to horses, to which she is highly allergic.  The patient is planning to move to [place] in the next six months and will need to establish care with a new primary care provider. She has already received her flu shot but has not yet received her COVID-19 vaccine due to concerns about potential side effects and scheduling difficulties.     ROS-see HPI  + = positive Constitutional:   No-   weight loss, night sweats, fevers, chills, fatigue, lassitude. HEENT:   No-  headaches, difficulty swallowing, tooth/dental problems, sore throat,       No-  sneezing, itching, ear ache, nasal congestion, post nasal drip,  CV:   chest pain, orthopnea, PND, swelling in lower extremities, anasarca,  dizziness, +palpitations Resp: + shortness of breath with exertion or at rest.               productive cough,  + non-productive cough,  No- coughing up of blood.              No-   change in color of mucus.  + wheezing.   Skin: No-   rash or lesions. GI:  No-   heartburn, indigestion, abdominal pain, nausea, vomiting,  GU:  MS:  No-   joint pain or swelling.   Neuro-     nothing unusual Psych:  No- change in mood or affect. No depression or anxiety.  No memory loss.  OBJ- Physical Exam General- Alert, Oriented, Affect-appropriate, Distress- none acute, well appearing Skin- rash-none, lesions- none, excoriation- none Lymphadenopathy- none Head- atraumatic            Eyes- Gross vision intact, PERRLA, conjunctivae and secretions clear            Ears-  Hearing, canals-normal            Nose-  clear, no-Septal dev, mucus, polyps, erosion, perforation             Throat- Mallampati II , mucosa clear , drainage- none, tonsils- atrophic Neck- flexible , trachea midline, no stridor , thyroid nl, carotid no bruit Chest - symmetrical excursion , unlabored           Heart/CV-+ RRR/occasional extra beat, no murmur , no gallop   , no rub, nl s1 s2                           - JVD- none , edema- none, stasis changes- none, varices- none           Lung- clear to P&A, wheeze- none, cough- none , dullness-none, rub- none           Chest wall-  Abd-  Br/ Gen/ Rectal- Not done, not indicated Extrem- cyanosis- none, clubbing, none, atrophy- none, strength- nl Neuro- grossly intact to observation  Assessment and Plan    Asthma Stable with occasional exacerbations related to environmental triggers (carpet, horses). Currently on generic Symbicort and ProAir as needed. -Continue current regimen of generic Symbicort and ProAir as needed. -Refill prescriptions for both medications, refillable for a year.  Musculoskeletal discomfort Chronic stiffness in back despite physical therapy and gym exercises. -No specific plan discussed in the conversation.  General Health Maintenance / Followup Plans Patient is planning to move to [place] in the next six months. -Recommended establishing with a primary care provider in new location for continuity of care. -If needed, patient can contact current provider for prescription refills until established with new provider.

## 2023-11-15 ENCOUNTER — Ambulatory Visit (INDEPENDENT_AMBULATORY_CARE_PROVIDER_SITE_OTHER): Payer: BC Managed Care – PPO | Admitting: Internal Medicine

## 2023-11-15 ENCOUNTER — Encounter: Payer: Self-pay | Admitting: Internal Medicine

## 2023-11-15 ENCOUNTER — Other Ambulatory Visit: Payer: Self-pay | Admitting: Internal Medicine

## 2023-11-15 ENCOUNTER — Ambulatory Visit: Payer: BC Managed Care – PPO

## 2023-11-15 VITALS — BP 112/64 | HR 60 | Temp 97.5°F | Ht 64.0 in | Wt 152.6 lb

## 2023-11-15 DIAGNOSIS — M6281 Muscle weakness (generalized): Secondary | ICD-10-CM

## 2023-11-15 DIAGNOSIS — R252 Cramp and spasm: Secondary | ICD-10-CM

## 2023-11-15 DIAGNOSIS — J45909 Unspecified asthma, uncomplicated: Secondary | ICD-10-CM | POA: Diagnosis not present

## 2023-11-15 DIAGNOSIS — M25571 Pain in right ankle and joints of right foot: Secondary | ICD-10-CM | POA: Diagnosis not present

## 2023-11-15 DIAGNOSIS — J4520 Mild intermittent asthma, uncomplicated: Secondary | ICD-10-CM

## 2023-11-15 DIAGNOSIS — R262 Difficulty in walking, not elsewhere classified: Secondary | ICD-10-CM | POA: Diagnosis not present

## 2023-11-15 MED ORDER — BUDESONIDE-FORMOTEROL FUMARATE 160-4.5 MCG/ACT IN AERO: INHALATION_SPRAY | RESPIRATORY_TRACT | 11 refills | Status: DC

## 2023-11-15 MED ORDER — ALBUTEROL SULFATE HFA 108 (90 BASE) MCG/ACT IN AERS: INHALATION_SPRAY | RESPIRATORY_TRACT | 12 refills | Status: AC

## 2023-11-15 NOTE — Telephone Encounter (Signed)
Script changed to Advair 250/generic for insurance coverage. I can't sign the script to send because pharmacy listed as not valid. Please clarify which pharmacy is to be used and send the script. Thanks.

## 2023-11-15 NOTE — Telephone Encounter (Signed)
Pharmacy comment: Alternative Requested:THE PRESCRIBED MEDICATION IS NOT COVERED BY INSURANCE. PLEASE CONSIDER CHANGING TO ONE OF THE SUGGESTED COVERED ALTERNATIVES.

## 2023-11-15 NOTE — Therapy (Addendum)
 OUTPATIENT PHYSICAL THERAPY LOWER EXTREMITY TREATMENT PHYSICAL THERAPY DISCHARGE SUMMARY  Visits from Start of Care: 7  Current functional level related to goals / functional outcomes: See below   Remaining deficits: See below   Education / Equipment: See below   Patient agrees to discharge. Patient goals were met. Patient is being discharged due to meeting the stated rehab goals.    Patient Name: Sierra Olsen MRN: [id number] DOB:03/23/1976, 47 y.o., female Today's Date: 11/15/2023  END OF SESSION:  PT End of Session - 11/15/23 0849     Visit Number 7    Date for PT Re-Evaluation 12/03/23    Authorization Type BCBS 60 VL--used 19 this year    PT Start Time 0845    PT Stop Time 0915    PT Time Calculation (min) 30 min    Activity Tolerance Patient tolerated treatment well    Behavior During Therapy WFL for tasks assessed/performed              Past Medical History:  Diagnosis Date   Allergy    Anxiety    Anxiety    Asthma    Celiac disease    Celiac disease    DVT (deep venous thrombosis) (HCC)    Of iliac vein of LLE   DVT (deep venous thrombosis) (HCC) 01/12/2019   Endometriosis    Endometriosis    Environmental allergies    Epigastric pain    Family history of adverse reaction to anesthesia    SISTER HAD TROUBLE BREATHING   GERD (gastroesophageal reflux disease)    Headache    Hx of migraine headaches    Hx of migraine headaches    Hypoglycemia    Hypoglycemia    Leukocytosis    Low back pain    Nausea    PE (pulmonary thromboembolism) (HCC) 03/2018   PE (pulmonary thromboembolism) (HCC)    PVC (premature ventricular contraction)    Retinal edema    Retinal edema    RUQ abdominal pain    Tinnitus    Tinnitus    Past Surgical History:  Procedure Laterality Date   ABDOMINAL HYSTERECTOMY     CORONARY ULTRASOUND/IVUS N/A 01/12/2019   Procedure: INTRAVASCULAR ULTRASOUND/IVUS;  Surgeon: Sheree Penne Bruckner, MD;  Location: [facility] INVASIVE  CV LAB;  Service: Cardiovascular;  Laterality: N/A;   DILATION AND CURETTAGE OF UTERUS     LOWER EXTREMITY VENOGRAPHY Left 01/12/2019   Procedure: LOWER EXTREMITY VENOGRAPHY;  Surgeon: Sheree Penne Bruckner, MD;  Location: [facility] INVASIVE CV LAB;  Service: Cardiovascular;  Laterality: Left;   PERIPHERAL VASCULAR INTERVENTION Left 01/12/2019   Procedure: PERIPHERAL VASCULAR INTERVENTION;  Surgeon: Sheree Penne Bruckner, MD;  Location: [facility] INVASIVE CV LAB;  Service: Cardiovascular;  Laterality: Left;  common and external iliac venous Ivus and stent   tubes in ears     age 73   WISDOM TOOTH EXTRACTION     Patient Active Problem List   Diagnosis Date Noted   Palpitations 05/25/2022   DVT (deep venous thrombosis) (HCC) 01/12/2019   PE (pulmonary thromboembolism) (HCC) 04/08/2018   Endometriosis 04/07/2018   Chest pain of uncertain etiology 04/09/2017   Asthma, mild intermittent, well-controlled 03/24/2014   Seasonal and perennial allergic rhinitis 03/24/2014    PCP: Katina Pfeiffer PA-C  REFERRING PROVIDER: Katina Pfeiffer PA-C   REFERRING DIAG: F23.38 right achilles tendinitis  THERAPY DIAG:  Right ankle pain; weakness Rationale for Evaluation and Treatment: Rehabilitation  ONSET DATE: 08/27/2023  SUBJECTIVE:   SUBJECTIVE STATEMENT: Patient  reports she is doing really good.  No pain and feels she is able to continue on her own from here.      PERTINENT HISTORY: Does Olympic weight lifting I ruptured an ankle tendon 2 years ago and wore a boot.   Sprained right ankle 2 big ones, I used to run with [employer] (teenager) PMH: Plavix  with Hx of DVT/PE 5 years ago, asthma, allergies, LBP  PAIN:   11/15/23 Are you having pain? No NPRS scale: 0/10 tightness in calf Pain location: tender and stiff lateral and medial heel Pain orientation: Right  PAIN TYPE: tight Pain description: intermittent  Aggravating factors: wearing flats (wearing HOKAs today running shoes 47 year  old);  mornings; running; up and down stairs Relieving factors: at rest   PRECAUTIONS: None    WEIGHT BEARING RESTRICTIONS: No  FALLS:  Has patient fallen in last 6 months? No  LIVING ENVIRONMENT: Lives with: lives with their family Lives in: House/apartment Stairs: been doing one at a time at home OCCUPATION: some days on feet a lot  PLOF: Independent  PATIENT GOALS: stronger ankle; a sense of control over tendons; no pain or inflammation  NEXT MD VISIT: as needed  OBJECTIVE:  Note: Objective measures were completed at Evaluation unless otherwise noted.  DIAGNOSTIC FINDINGS: none  PATIENT SURVEYS:  LEFS 57/80 11/15/23: LEFS 70/80  COGNITION: Overall cognitive status: Within functional limits for tasks assessed      EDEMA:  No edema today but pt reports achilles swelling initially  MUSCLE LENGTH:  decreased gastroc soleus muscle lengths; calcaneal hypomobility especially eversion   POSTURE:right WNLs;  left mild pes planus; pt able to actively contract intrinsics for better arch/alignment  PALPATION: Right tender points in gastroc and soleus  LOWER EXTREMITY ROM: full ankle ROM symmetrical with right  LOWER EXTREMITY MMT:  right PF 4-/5, right DF 5/5, inversion and eversion 4+/5, foot intrinsics 4+/5;  left grossly 5/5  11/15/23: All 5/5 throughout right ankle without pain   FUNCTIONAL TESTS:  Difficulty stabilizing on right with SLS 10 sec; left 15 sec 11/15/23: able to do 20+ on right and left  Pain with double heel raise 5x 11/15/23: No pain with double heel raise  Able to do 1x right single leg heel raise but painful;  left single leg heel raise 5x 11/15/23: able to do single leg heel raise on right x 5 with only minor discomfort  GAIT: Comments: WNLs in HOKA shoes 11/15/23: WNL in all shoes   TODAY'S TREATMENT:   DATE: 12/20 DC assessment completed Manual STM to right calf and foot along with manual stretching x 5 min Manual IASTM to right  achlliles and calf x 5 min DC plan discussed and how to progress back into running  DATE: 12/13 (patient requested short visit, has funeral to get to) Recumbent bike x 5 min level 1 (PT present to discuss status) Prostretch x 5 holding 10 sec  Bil heel raises with small green ball squeeze b/w heels 10x Standing on foam short foot ex 15x Standing on one foot on foam and balance 10 sec 10x  SLS on foam doing cone touches (cone on mat table) Manual: instrument assisted stainless steel blade soft tissue mobilization to right gastroc/soleus x 10 min  DATE: 12/6 Nustep x 5 min level 5 (PT present to discuss status) Wall gastroc and plantar fascia stretch 20 sec 3x Standing on foam short foot ex 12x without foam added to HEP Standing on one foot on foam and balance  5 sec 10x  SLS woodpecker ex 10x (added to HEP) Bil heel raises with small green ball squeeze b/w heels 10x Manual: instrument assisted stainless steel blade soft tissue mobilization to right gastroc/soleus Trigger Point Dry-Needling  Treatment instructions: Expect mild to moderate muscle soreness. S/S of pneumothorax if dry needled over a lung field, and to seek immediate medical attention should they occur. Patient verbalized understanding of these instructions and education. Patient Consent Given: Yes Education handout provided: Yes Muscles treated: right medial and lateral gastroc  Electrical stimulation performed: No Parameters: N/A Treatment response/outcome: Skilled palpation used to identify taut bands and trigger points.  Once identified, dry needling techniques used to treat these areas                                                                                                                               DATE: 11/8 Nustep x 5 min level 5 (PT present to discuss status) Toe scrunch  x 10 with towel (10 pulls) Short foot  2 x 10 Inv/Ev towel push x 10 each side Prostretch x 5 each on left foot of gastroc stretch,  then soleus stretch holding 10 sec each Manual: PROM to left ankle, STM to plantar fascia and gastroc-soleus as well as achilles,  rolling x 3 min to achilles and plantar fascia, isolated achilles stretch. Trigger Point Dry-Needling  Treatment instructions: Expect mild to moderate muscle soreness. S/S of pneumothorax if dry needled over a lung field, and to seek immediate medical attention should they occur. Patient verbalized understanding of these instructions and education. Patient Consent Given: Yes Education handout provided: Yes Muscles treated: right lateral gastroc and soleus Electrical stimulation performed: No Parameters: N/A Treatment response/outcome: Skilled palpation used to identify taut bands and trigger points.  Once identified, dry needling techniques used to treat these areas.  Twitch response ellicited along with palpable elongation of muscle.  Following treatment, patient reported feeling improved push off and less pain.       PATIENT EDUCATION:  Education details: Educated patient on anatomy and physiology of current symptoms, prognosis, plan of care as well as initial self care strategies to promote recovery;  PEACE and LOVE acronyms per pt instructions; discussed checking mileage using running app on current running shoes (time to replace?); discussed orthotic options Person educated: Patient Education method: Explanation Education comprehension: verbalized understanding  HOME EXERCISE PROGRAM: Access Code: [email or web address] URL: [email or web address] Date: 11/01/2023 Prepared by: Glade Pesa  Exercises - Long Sitting Ankle Plantar Flexion with Resistance (Mirrored)  - 1 x daily - 7 x weekly - 5 sets - 1 reps - 45 hold - Soleus Heel Raise in Stride Stance Position  - 1 x daily - 7 x weekly - 5 sets - 1 reps - 45 hold - Single Leg Isometric Heel Raise at Wall (Mirrored)  - 1 x daily - 7 x weekly - 5 sets - 1 reps - 45 hold - Gastroc Stretch on Wall  -  1 x  daily - 7 x weekly - 1 sets - 3 reps - 20-30 hold - Seated Gastroc Stretch with Strap  - 1 x daily - 7 x weekly - 1 sets - 3 reps - 20-30 hold - Woodpeckers One Leg (Mirrored)  - 2-3 x daily - 7 x weekly - 1 sets - 10 reps - Arch Lifting  - 2-3 x daily - 7 x weekly - 1 sets - 10 reps - Standing Calf Raise With Small Ball at Heels  - 2-3 x daily - 7 x weekly - 1 sets - 10 reps ASSESSMENT:  CLINICAL IMPRESSION: Hoda met all final goals and all objective findings are much improved.  She is taking a break from running due to the weather and feeling like this would give her ankle further time to heal.  She plans on trying to ease back into running in the Spring.  We will DC at this time with all goals met.  See objective findings above.      OBJECTIVE IMPAIRMENTS: decreased activity tolerance, difficulty walking, decreased strength, impaired perceived functional ability, and pain.   ACTIVITY LIMITATIONS: locomotion level  PARTICIPATION LIMITATIONS: community activity and running and weight lifting  PERSONAL FACTORS: 1 comorbidity: prior history of ankle injury are also affecting patient's functional outcome.   REHAB POTENTIAL: Good  CLINICAL DECISION MAKING: Stable/uncomplicated  EVALUATION COMPLEXITY: Low   GOALS: Goals reviewed with patient? Yes  SHORT TERM GOALS: Target date: 10/22/2023   The patient will demonstrate knowledge of basic self care strategies and exercises to promote healing  Baseline: Goal status: MET  2.  The patient will be able to walk a mile with min difficulty Baseline:  Goal status: In progress  3.  The patient will be able to do Olympic weightlifting with modifications as needed Baseline:  Goal status: MET  4.  The patient will have improved strength for 20x double leg heel raises with pain 3/10 Baseline:  Goal status: MET 11/08/23     LONG TERM GOALS: Target date: 12/03/2023      The patient will be independent in a safe self progression of a  home exercise program to promote further recovery of function  Baseline:  Goal status: In progress  2.  The patient will report a 75% improvement in pain levels with functional activities which are currently difficult including walking, running, ascending and descending stairs Baseline:  Goal status: MET 90%  3.  The patient will have good strength in gastroc/soleus muscles for single leg impact activities like running and hopping Baseline:  Goal status: MET not fully running yet but can do short distances  4.  The patient will be able to return to a running program (intervals if needed) Baseline: patient has transitioned to bike for now due to darker/colder mornings but feels confident that she could resume in the spring Goal status: MET  5.  LEFS improved to 67/80 indicating decreased pain and increased function Baseline:  Goal status: MET     PLAN:  PT FREQUENCY: 1x/week  PT DURATION: 12 weeks  PLANNED INTERVENTIONS: 97164- PT Re-evaluation, 97110-Therapeutic exercises, 97530- Therapeutic activity, 97112- Neuromuscular re-education, 97535- Self Care, 02859- Manual therapy, [id number]- Aquatic Therapy, 97014- Electrical stimulation (unattended), [id number]- Electrical stimulation (manual), [id number]- Vasopneumatic device, [id number]- Ultrasound, [id number]- Ionotophoresis 4mg /ml Dexamethasone, Patient/Family education, Taping, Dry Needling, Joint mobilization, Cryotherapy, and Moist heat  PLAN FOR NEXT SESSION: We will DC at this time.     Delon B. Lyndle Pang, PT 06/30/24 10:11 AM  Delon B. Jaxson Anglin, PT 11/15/23 9:25 AM [facility] [address], [address] [place], [place] [address] Phone # [phone] Fax [phone]

## 2023-11-15 NOTE — Patient Instructions (Signed)
Refills sent  Please call if we can help 

## 2023-11-16 ENCOUNTER — Other Ambulatory Visit: Payer: Self-pay

## 2023-11-16 ENCOUNTER — Other Ambulatory Visit: Payer: Self-pay | Admitting: Internal Medicine

## 2023-11-16 MED ORDER — FLUTICASONE-SALMETEROL 250-50 MCG/ACT IN AEPB: 1.0000 | INHALATION_SPRAY | Freq: Two times a day (BID) | RESPIRATORY_TRACT | 11 refills | Status: DC

## 2023-11-25 ENCOUNTER — Other Ambulatory Visit: Payer: Self-pay | Admitting: Family Medicine

## 2023-11-25 DIAGNOSIS — Z1231 Encounter for screening mammogram for malignant neoplasm of breast: Secondary | ICD-10-CM

## 2023-11-29 ENCOUNTER — Telehealth: Payer: Self-pay

## 2023-11-29 NOTE — Telephone Encounter (Signed)
 Pt called stating she is moving in approximately 6 mos and is requesting a referral.  Chart reviewed, noting no follow-up in >3 years.  Pt states she may not be moving for a while now so she is requesting to stay with Dr. Sheree.  Advised pt to get a new referral from her PCP and/or if moving can get a referral for the local area.  Pt confirms understanding.

## 2023-12-11 DIAGNOSIS — F4010 Social phobia, unspecified: Secondary | ICD-10-CM | POA: Diagnosis not present

## 2023-12-12 ENCOUNTER — Other Ambulatory Visit: Payer: Self-pay | Admitting: Vascular Surgery

## 2023-12-16 ENCOUNTER — Other Ambulatory Visit: Payer: Self-pay | Admitting: *Deleted

## 2023-12-16 DIAGNOSIS — Z86718 Personal history of other venous thrombosis and embolism: Secondary | ICD-10-CM

## 2023-12-19 ENCOUNTER — Ambulatory Visit
Admission: RE | Admit: 2023-12-19 | Discharge: 2023-12-19 | Disposition: A | Discharge status: Home / Self Care | Payer: BC Managed Care – PPO | Source: Ambulatory Visit | Attending: Family Medicine | Admitting: Family Medicine

## 2023-12-19 DIAGNOSIS — Z1231 Encounter for screening mammogram for malignant neoplasm of breast: Secondary | ICD-10-CM | POA: Diagnosis not present

## 2023-12-25 ENCOUNTER — Ambulatory Visit (HOSPITAL_COMMUNITY)
Admission: RE | Admit: 2023-12-25 | Discharge: 2023-12-25 | Disposition: A | Discharge status: Home / Self Care | Payer: BC Managed Care – PPO | Source: Ambulatory Visit | Attending: Vascular Surgery | Admitting: Vascular Surgery

## 2023-12-25 ENCOUNTER — Encounter: Payer: Self-pay | Admitting: Vascular Surgery

## 2023-12-25 ENCOUNTER — Ambulatory Visit (INDEPENDENT_AMBULATORY_CARE_PROVIDER_SITE_OTHER): Payer: BC Managed Care – PPO | Admitting: Vascular Surgery

## 2023-12-25 VITALS — BP 111/71 | HR 49 | Temp 98.3°F | Resp 16 | Ht 64.0 in | Wt 154.0 lb

## 2023-12-25 DIAGNOSIS — Z86718 Personal history of other venous thrombosis and embolism: Secondary | ICD-10-CM | POA: Insufficient documentation

## 2023-12-25 DIAGNOSIS — I871 Compression of vein: Secondary | ICD-10-CM

## 2023-12-25 MED ORDER — CLOPIDOGREL BISULFATE 75 MG PO TABS: 75.0000 mg | ORAL_TABLET | Freq: Every day | ORAL | 11 refills | Status: DC

## 2023-12-25 NOTE — Progress Notes (Signed)
Patient ID: Sierra Olsen, female   DOB: [birth year], 48 y.o.   MRN: [id number]  Reason for Consult: New Patient (Initial Visit)   Referred by Sigmund Hazel, MD  Subjective:     HPI:  Sierra Olsen is a 48 y.o. 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  At this point she is not having any ongoing issues she has taken Plavix.  She has begun Olympic weight lifting does not have any swelling of the left lower extremity.  She is planning to move to [facility] in the near future to be with her husband Sierra Olsen.   Past Medical History:  Diagnosis Date   Allergy    Anxiety    Anxiety    Asthma    Celiac disease    Celiac disease    DVT (deep venous thrombosis) (HCC)    Of iliac vein of LLE   DVT (deep venous thrombosis) (HCC) 01/12/2019   Endometriosis    Endometriosis    Environmental allergies    Epigastric pain    Family history of adverse reaction to anesthesia    SISTER HAD TROUBLE BREATHING   GERD (gastroesophageal reflux disease)    Headache    Hx of migraine headaches    Hx of migraine headaches    Hypoglycemia    Hypoglycemia    Leukocytosis    Low back pain    Nausea    PE (pulmonary thromboembolism) (HCC) 03/2018   PE (pulmonary thromboembolism) (HCC)    PVC (premature ventricular contraction)    Retinal edema    Retinal edema    RUQ abdominal pain    Tinnitus    Tinnitus    Family History  Problem Relation Age of Onset   Asthma Mother    Emphysema Maternal Grandmother        smoker   Emphysema Maternal Grandfather    Colon cancer Paternal Grandmother    Heart disease Paternal Grandfather    Heart attack Paternal Grandfather    Allergies Other        both sides of family   Asthma Brother    Breast cancer Other        father side   Migraines Sister    Endometriosis Sister    Allergies Sister    Allergies Brother    Breast cancer Paternal Aunt    Past  Surgical History:  Procedure Laterality Date   ABDOMINAL HYSTERECTOMY     CORONARY ULTRASOUND/IVUS N/A 01/12/2019   Procedure: INTRAVASCULAR ULTRASOUND/IVUS;  Surgeon: Maeola Harman, MD;  Location: [facility] INVASIVE CV LAB;  Service: Cardiovascular;  Laterality: N/A;   DILATION AND CURETTAGE OF UTERUS     LOWER EXTREMITY VENOGRAPHY Left 01/12/2019   Procedure: LOWER EXTREMITY VENOGRAPHY;  Surgeon: Maeola Harman, MD;  Location: [facility] INVASIVE CV LAB;  Service: Cardiovascular;  Laterality: Left;   PERIPHERAL VASCULAR INTERVENTION Left 01/12/2019   Procedure: PERIPHERAL VASCULAR INTERVENTION;  Surgeon: Maeola Harman, MD;  Location: [facility] INVASIVE CV LAB;  Service: Cardiovascular;  Laterality: Left;  common and external iliac venous Ivus and stent   tubes in ears     age 7   WISDOM TOOTH EXTRACTION      Short Social History:  Social History   Tobacco Use   Smoking status: Former    Current packs/day: 0.00    Average packs/day: 1 pack/day for 5.0 years (5.0 ttl pk-yrs)  Types: Cigarettes    Start date: 11/26/1996    Quit date: 11/26/2001    Years since quitting: 22.0   Smokeless tobacco: Never  Substance Use Topics   Alcohol use: Yes    Alcohol/week: 7.0 standard drinks of alcohol    Types: 7 Glasses of wine per week    Comment: weekly    Allergies  Allergen Reactions   Aspirin     Eye swelling   Contrast Media [Iodinated Contrast Media]     wheezing   Gluten Meal Other (See Comments)    celiac disease    Current Outpatient Medications  Medication Sig Dispense Refill   acetaminophen (TYLENOL) 325 MG tablet Take 325 mg by mouth every 6 (six) hours as needed (for pain.).     albuterol (PROAIR HFA) 108 (90 Base) MCG/ACT inhaler 2 puffs every 6 hours as needed 18 g 12   ALPRAZolam (XANAX) 0.25 MG tablet Take 0.125 mg by mouth 2 (two) times daily as needed for anxiety.      budesonide-formoterol (BREYNA) 160-4.5 MCG/ACT inhaler Inhale 2 puffs into the  lungs in the morning and at bedtime. 10.2 g 11   clopidogrel (PLAVIX) 75 MG tablet TAKE 1 TABLET DAILY (NEED APPOINTMENT) 30 tablet 0   mometasone (NASONEX) 50 MCG/ACT nasal spray Place 2 sprays into the nose daily as needed (allergies).      sertraline (ZOLOFT) 25 MG tablet Patient takes 1/2 tablet everyday     Spacer/Aero-Holding Chambers (AEROCHAMBER MV) inhaler Use as instructed 1 each 0   No current facility-administered medications for this visit.    Review of Systems  Constitutional:  Constitutional negative. HENT: HENT negative.  Eyes: Eyes negative.  Respiratory: Respiratory negative.  Cardiovascular: Cardiovascular negative.  GI: Gastrointestinal negative.  Musculoskeletal: Musculoskeletal negative.  Skin: Skin negative.  Neurological: Neurological negative. Hematologic: Hematologic/lymphatic negative.  Psychiatric: Psychiatric negative.        Objective:  Objective   Vitals:   12/25/23 1036  BP: 111/71  Pulse: (!) 49  Resp: 16  Temp: 98.3 F (36.8 C)  SpO2: 96%  Weight: 154 lb (69.9 kg)  Height: 5\' 4"  (1.626 m)   Body mass index is 26.43 kg/m.  Physical Exam HENT:     Head: Normocephalic.     Nose: Nose normal.  Eyes:     Pupils: Pupils are equal, round, and reactive to light.  Cardiovascular:     Rate and Rhythm: Normal rate.     Pulses: Normal pulses.  Pulmonary:     Effort: Pulmonary effort is normal.  Abdominal:     General: Abdomen is flat.  Musculoskeletal:        General: Normal range of motion.     Right lower leg: No edema.     Left lower leg: No edema.  Skin:    General: Skin is warm.     Capillary Refill: Capillary refill takes less than 2 seconds.  Neurological:     General: No focal deficit present.     Mental Status: She is alert.     Data: IVC/Iliac Findings:  +----------+------+--------+--------+    IVC    PatentThrombusComments  +----------+------+--------+--------+  IVC Prox  patent                   +----------+------+--------+--------+  IVC Mid   patent                  +----------+------+--------+--------+  IVC Distalpatent                  +----------+------+--------+--------+     +-------------------+---------+-----------+---------+-----------+--------+  CIV        RT-PatentRT-ThrombusLT-PatentLT-ThrombusComments  +-------------------+---------+-----------+---------+-----------+--------+  Common Iliac Prox                       patent                       +-------------------+---------+-----------+---------+-----------+--------+  Common Iliac Mid                        patent                       +-------------------+---------+-----------+---------+-----------+--------+  Common Iliac Distal patent              patent                       +-------------------+---------+-----------+---------+-----------+--------+      +-------------------------+---------+-----------+---------+-----------+----  ----+            EIV            RT-PatentRT-ThrombusLT-PatentLT-ThrombusComments  +-------------------------+---------+-----------+---------+-----------+----  ----+  External Iliac Vein Prox  patent              patent                        +-------------------------+---------+-----------+---------+-----------+----  ----+  External Iliac Vein Mid   patent              patent                        +-------------------------+---------+-----------+---------+-----------+----  ----+  External Iliac Vein       patent              patent                        Distal                                                                      +-------------------------+---------+-----------+---------+-----------+----  ----+        Summary:  IVC/Iliac: Patent left CIV and EIV stents.      Assessment/Plan:    48 year old female now 5 years status post stent placement of her left lower extremity has  recovered very well without ongoing symptoms and with satisfactory duplex today.  I have refilled her Plavix prescription which she takes as her antiplatelet medication was refilled and she is planning to move to [place] we can transfer records should she need future.     Maeola Harman MD [facility]

## 2023-12-27 DIAGNOSIS — F4010 Social phobia, unspecified: Secondary | ICD-10-CM | POA: Diagnosis not present

## 2024-01-31 DIAGNOSIS — F4010 Social phobia, unspecified: Secondary | ICD-10-CM | POA: Diagnosis not present

## 2024-02-27 DIAGNOSIS — F4010 Social phobia, unspecified: Secondary | ICD-10-CM | POA: Diagnosis not present

## 2024-03-20 DIAGNOSIS — F4010 Social phobia, unspecified: Secondary | ICD-10-CM | POA: Diagnosis not present

## 2024-04-17 DIAGNOSIS — F4010 Social phobia, unspecified: Secondary | ICD-10-CM | POA: Diagnosis not present

## 2024-05-01 DIAGNOSIS — F4010 Social phobia, unspecified: Secondary | ICD-10-CM | POA: Diagnosis not present

## 2024-07-02 ENCOUNTER — Other Ambulatory Visit: Payer: Self-pay

## 2024-07-02 ENCOUNTER — Telehealth: Payer: Self-pay

## 2024-07-02 MED ORDER — CLOPIDOGREL BISULFATE 75 MG PO TABS: 75.0000 mg | ORAL_TABLET | Freq: Every day | ORAL | 3 refills | Status: AC

## 2024-07-02 MED ORDER — BUDESONIDE-FORMOTEROL FUMARATE 160-4.5 MCG/ACT IN AERO: 2.0000 | INHALATION_SPRAY | Freq: Two times a day (BID) | RESPIRATORY_TRACT | 11 refills | Status: AC

## 2024-07-02 NOTE — Telephone Encounter (Signed)
 Copied from CRM [id number]. Topic: Clinical - Prescription Issue >> Jul 02, 2024 12:23 PM Sierra Olsen wrote: Reason for CRM: Pt stated her prescription is of budesonide -formoterol  (BREYNA ) 160-4.5 MCG/ACT inhaler is at [id number] [place], [place] - [address], but she is requesting it to be sent to [id number] - [place], [place] - [address] [[id number]].   Pt's phone number is [phone] ok to leave a vm.     Rx sent to pharmacy / Lm per patient request / no answer
